# Patient Record
Sex: Female | Born: 1995 | Race: White | Hispanic: No | Marital: Single | Smoking: Current every day smoker
Health system: Southern US, Community
[De-identification: ages and names within clinical notes are randomized; demographics above are authoritative.]

## PROBLEM LIST (undated history)

## (undated) DIAGNOSIS — F329 Major depressive disorder, single episode, unspecified: Secondary | ICD-10-CM

## (undated) DIAGNOSIS — F419 Anxiety disorder, unspecified: Secondary | ICD-10-CM

## (undated) DIAGNOSIS — F32A Depression, unspecified: Secondary | ICD-10-CM

## (undated) DIAGNOSIS — K219 Gastro-esophageal reflux disease without esophagitis: Secondary | ICD-10-CM

## (undated) HISTORY — PX: WISDOM TOOTH EXTRACTION: SHX21

## (undated) HISTORY — DX: Depression, unspecified: F32.A

## (undated) HISTORY — DX: Anxiety disorder, unspecified: F41.9

## (undated) HISTORY — DX: Major depressive disorder, single episode, unspecified: F32.9

## (undated) HISTORY — DX: Gastro-esophageal reflux disease without esophagitis: K21.9

---
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
## 2013-05-20 ENCOUNTER — Telehealth: Payer: Self-pay | Admitting: Nurse Practitioner

## 2013-05-20 DIAGNOSIS — N200 Calculus of kidney: Secondary | ICD-10-CM

## 2013-05-21 NOTE — Telephone Encounter (Signed)
referral made.

## 2013-05-22 ENCOUNTER — Telehealth: Payer: Self-pay | Admitting: Nurse Practitioner

## 2013-05-22 NOTE — Telephone Encounter (Signed)
Need to know why, IS she pregnant?

## 2013-05-26 ENCOUNTER — Other Ambulatory Visit: Payer: Self-pay | Admitting: Nurse Practitioner

## 2013-05-26 DIAGNOSIS — Z3490 Encounter for supervision of normal pregnancy, unspecified, unspecified trimester: Secondary | ICD-10-CM

## 2013-05-26 NOTE — Telephone Encounter (Signed)
Yes she is pregnant

## 2013-05-26 NOTE — Telephone Encounter (Signed)
Referral made 

## 2013-05-26 NOTE — Telephone Encounter (Signed)
Patient is pregnant.

## 2013-05-26 NOTE — Telephone Encounter (Signed)
Aware ref has been started

## 2013-05-28 ENCOUNTER — Telehealth: Payer: Self-pay | Admitting: Nurse Practitioner

## 2014-01-15 ENCOUNTER — Telehealth: Payer: Self-pay | Admitting: Nurse Practitioner

## 2014-01-15 ENCOUNTER — Other Ambulatory Visit: Payer: Self-pay | Admitting: Nurse Practitioner

## 2014-01-15 DIAGNOSIS — F32A Depression, unspecified: Secondary | ICD-10-CM

## 2014-01-15 DIAGNOSIS — F329 Major depressive disorder, single episode, unspecified: Secondary | ICD-10-CM

## 2014-01-15 MED ORDER — SERTRALINE HCL 100 MG PO TABS: 100.0000 mg | ORAL_TABLET | Freq: Every day | ORAL | Status: DC

## 2014-01-15 NOTE — Telephone Encounter (Signed)
Looks like she should have ran out in 05/2013, not sure about this?

## 2014-01-15 NOTE — Telephone Encounter (Signed)
I sent the Rx to the pharmacy. Patient NTBS for follow up and lab work

## 2014-01-20 ENCOUNTER — Other Ambulatory Visit: Payer: Self-pay | Admitting: Nurse Practitioner

## 2014-02-05 IMAGING — CR DG ANKLE COMPLETE 3+V*L*
3 series · 3 of 3 positions shown · non-contrast
Comparison: None.

CLINICAL DATA: Pain/injury to left foot/ankle

LEFT ANKLE COMPLETE - 3+ VIEW

[view not recorded (1 of 3)]
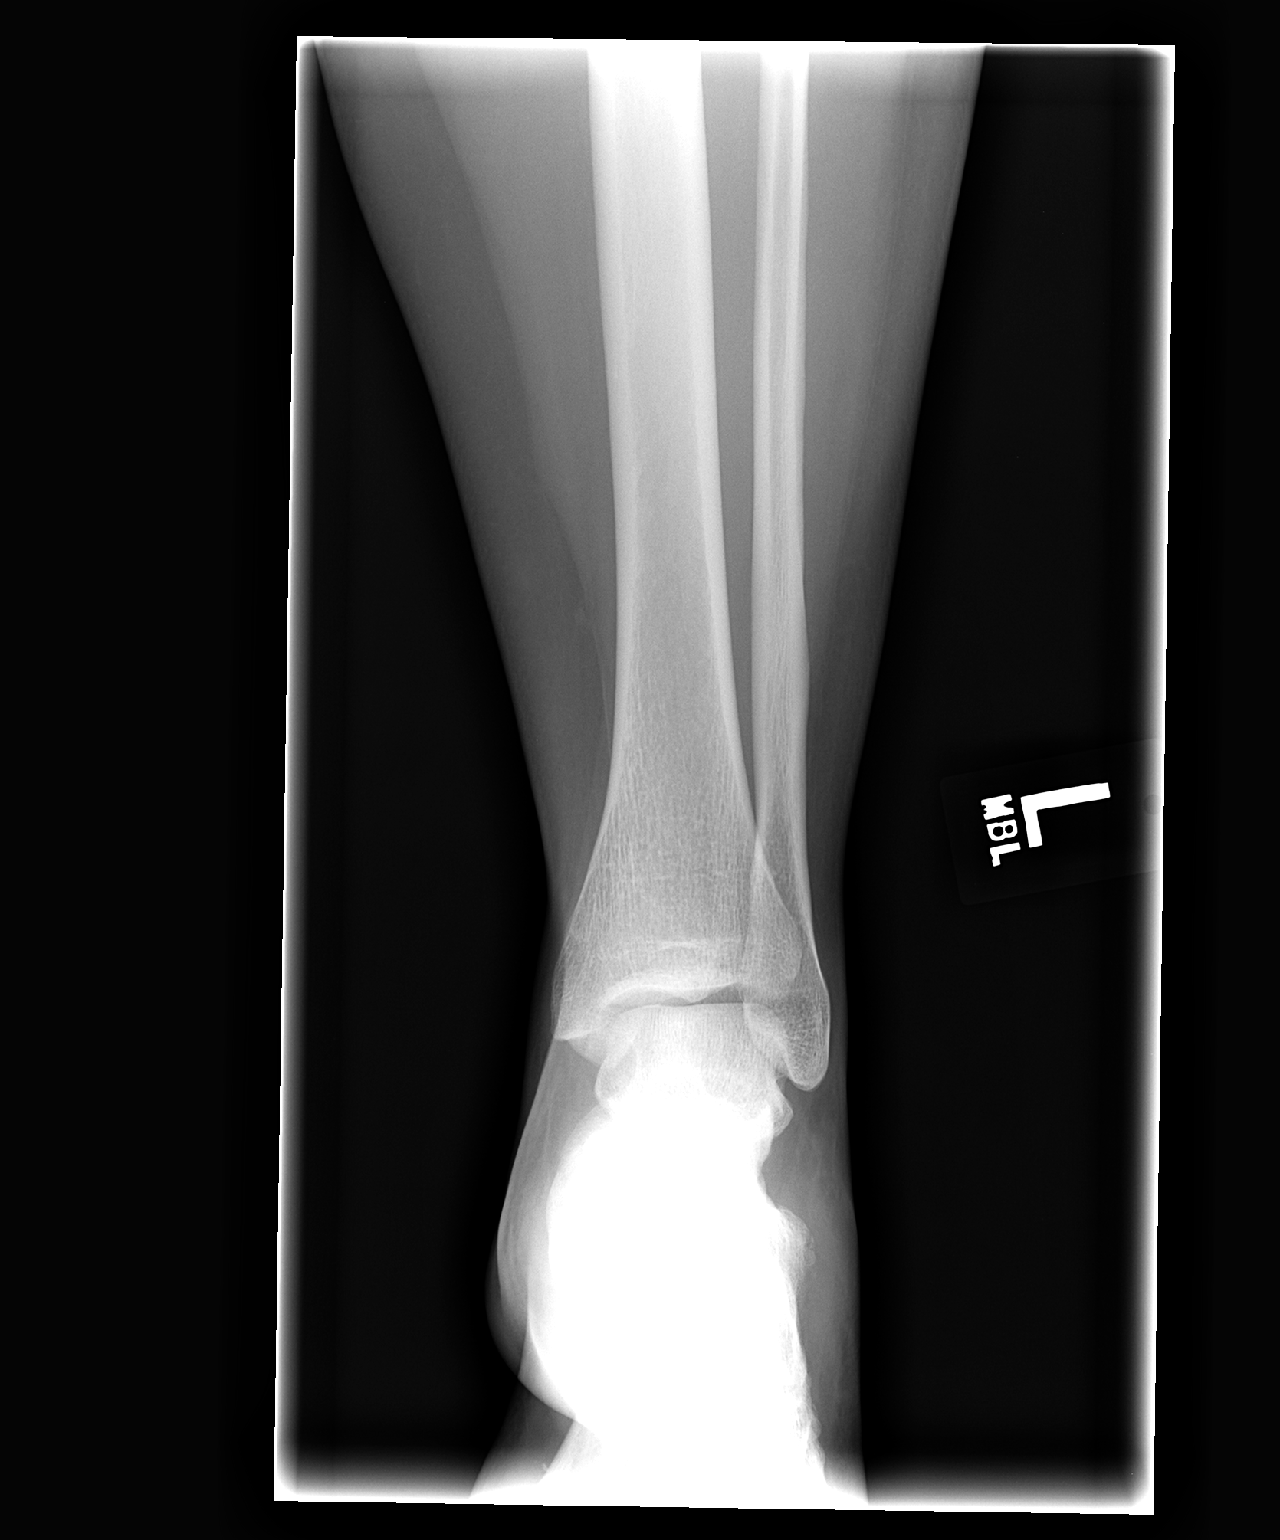

[view not recorded (2 of 3)]
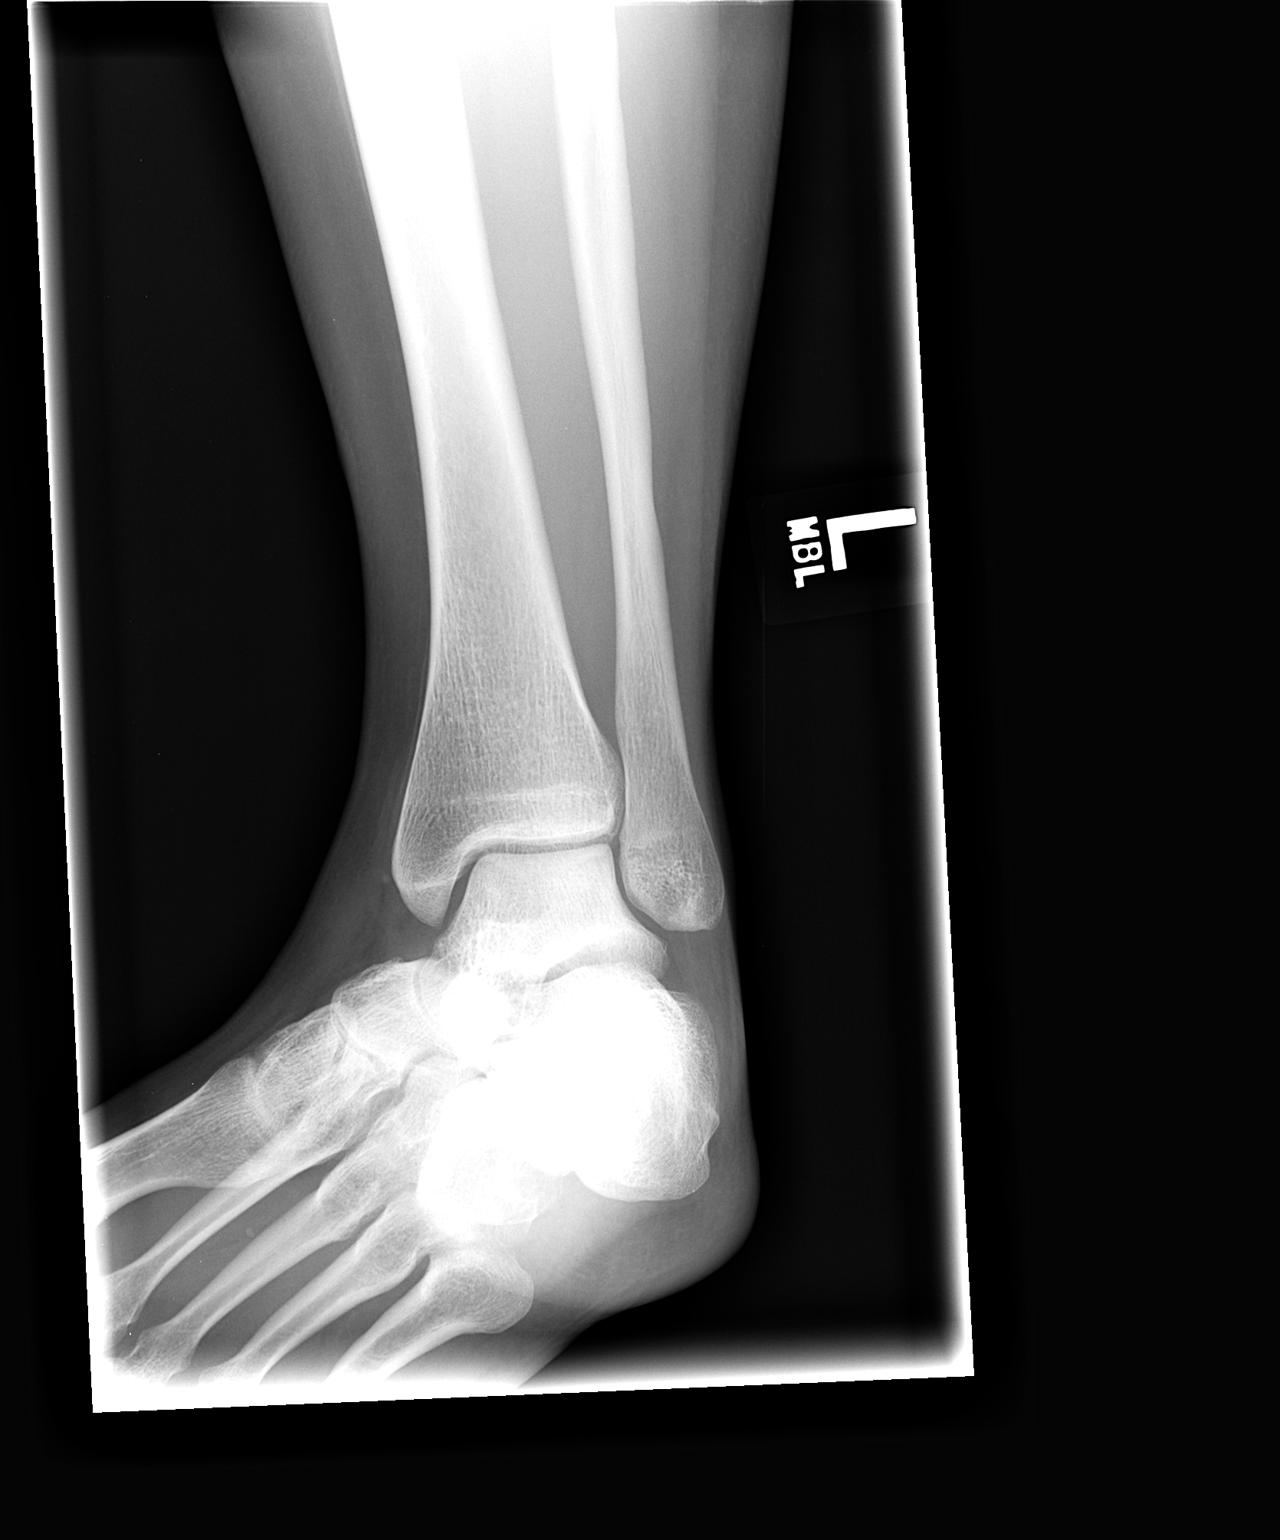

[view not recorded (3 of 3)]
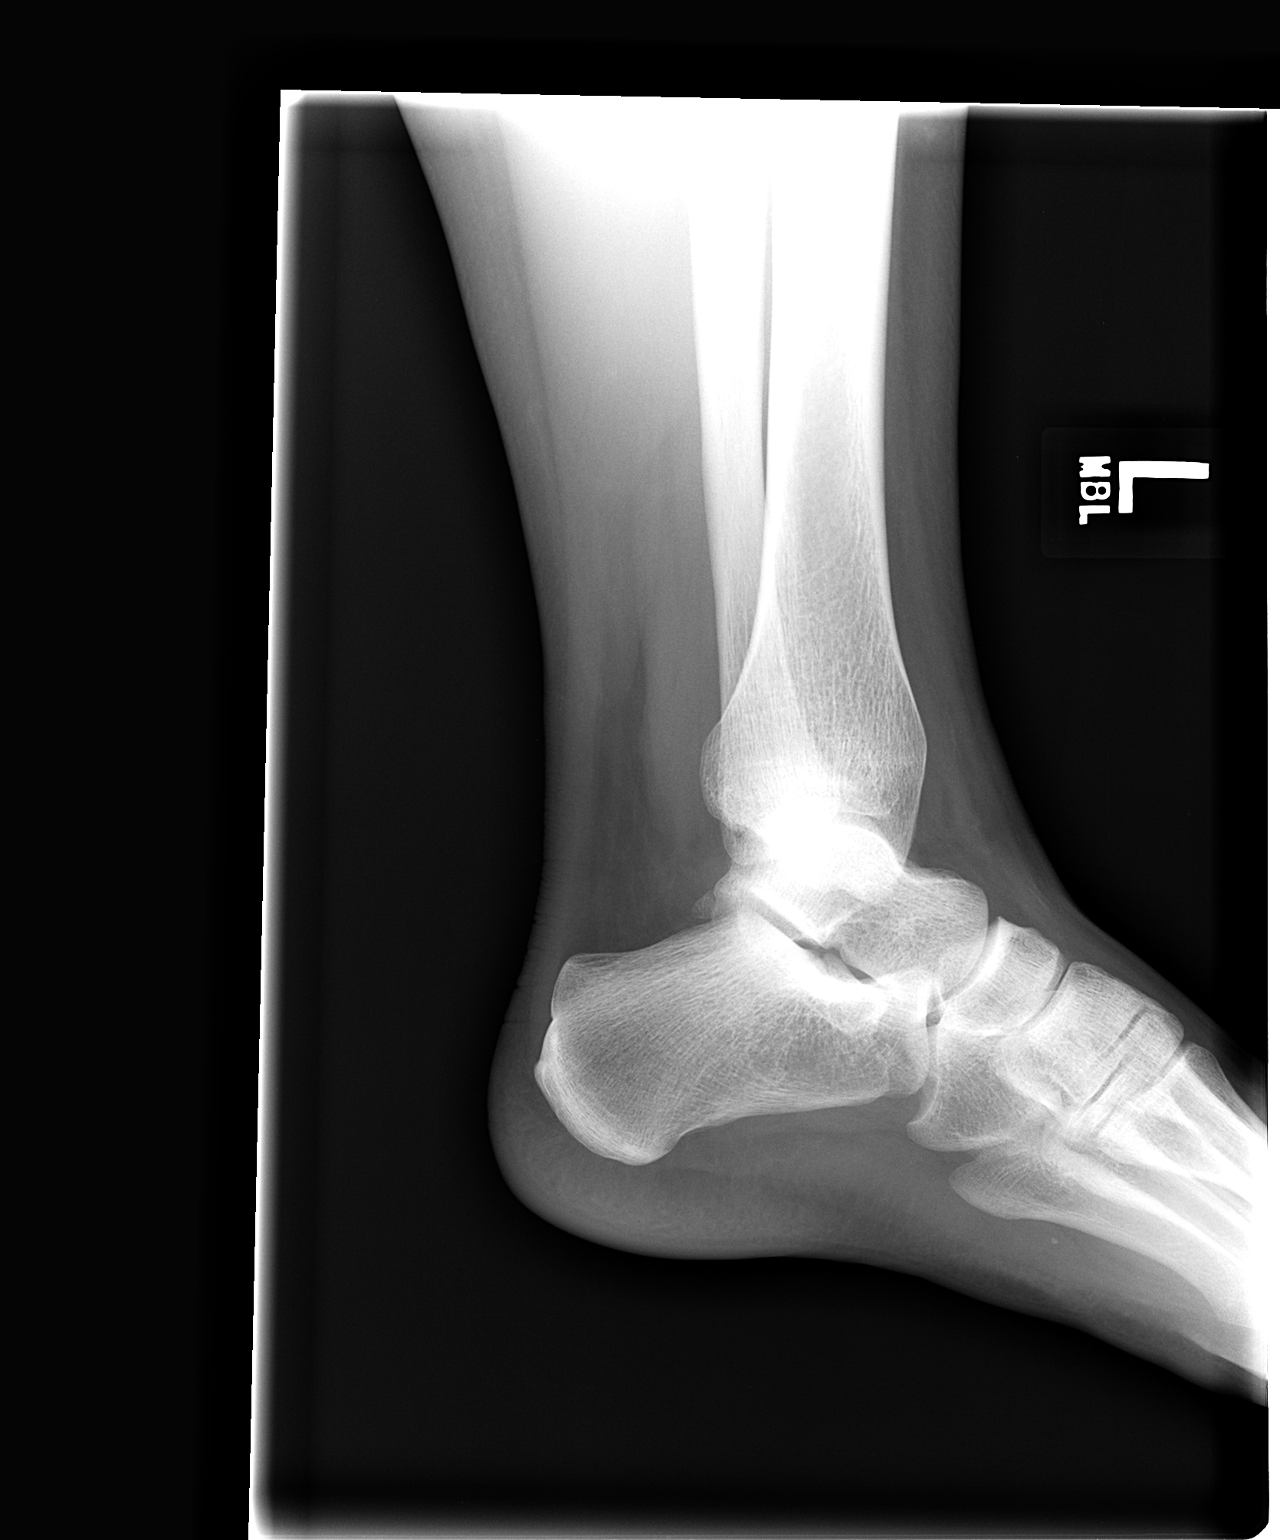

[3 of 3 positions shown; findings below may reference images not displayed]

FINDINGS: No fracture or dislocation is seen.

The ankle mortise is intact.

The base of the fifth metatarsal is unremarkable.

The visualized soft tissues are unremarkable.
IMPRESSION: No fracture or dislocation is seen.

## 2014-02-15 ENCOUNTER — Ambulatory Visit (INDEPENDENT_AMBULATORY_CARE_PROVIDER_SITE_OTHER): Payer: Medicaid Other | Admitting: Nurse Practitioner

## 2014-02-15 ENCOUNTER — Encounter: Payer: Self-pay | Admitting: Nurse Practitioner

## 2014-02-15 VITALS — BP 120/70 | HR 88 | Temp 98.4°F | Ht 62.0 in | Wt 175.0 lb

## 2014-02-15 DIAGNOSIS — F3289 Other specified depressive episodes: Secondary | ICD-10-CM

## 2014-02-15 DIAGNOSIS — F329 Major depressive disorder, single episode, unspecified: Secondary | ICD-10-CM

## 2014-02-15 DIAGNOSIS — F32A Depression, unspecified: Secondary | ICD-10-CM

## 2014-02-15 DIAGNOSIS — F411 Generalized anxiety disorder: Secondary | ICD-10-CM

## 2014-02-15 MED ORDER — ESCITALOPRAM OXALATE 20 MG PO TABS: 20.0000 mg | ORAL_TABLET | Freq: Every day | ORAL | Status: DC

## 2014-02-15 NOTE — Patient Instructions (Signed)

## 2014-02-15 NOTE — Progress Notes (Signed)
   Subjective:    Patient ID: Kayla FraiseElizabeth D Ding, female    DOB: Feb 23, 1996, 18 y.o.   MRN: [id number]  HPI Patient here today for follow up of depression and anxiety- She is currently on zoloft 100mg  daily- SHe says that she can't really tell if it is helping or not. Would like  To try something else. Took celexa in the past and that did ot really help.    Review of Systems  Constitutional: Negative for appetite change.  Respiratory: Negative.   Cardiovascular: Negative.   Gastrointestinal: Negative.   Genitourinary: Negative.   Neurological: Negative.   Psychiatric/Behavioral: Negative.   All other systems reviewed and are negative.      Objective:   Physical Exam  Constitutional: She is oriented to person, place, and time. She appears well-developed and well-nourished.  Cardiovascular: Normal rate and normal heart sounds.   Pulmonary/Chest: Effort normal and breath sounds normal.  Neurological: She is alert and oriented to person, place, and time.  Skin: Skin is warm and dry.  Psychiatric: She has a normal mood and affect. Her behavior is normal. Judgment and thought content normal.   BP 120/70  Pulse 88  Temp(Src) 98.4 F (36.9 C) (Oral)  Ht 5\' 2"  (1.575 m)  Wt 175 lb (79.379 kg)  BMI 32.00 kg/m2  LMP 02/14/2014        Assessment & Plan:   1. Depression   2. GAD (generalized anxiety disorder)    Meds ordered this encounter  Medications  . Norgestimate-Ethinyl Estradiol Triphasic (TRI-SPRINTEC) 0.18/0.215/0.25 MG-35 MCG tablet    Sig: Take 1 tablet by mouth daily.  Marland Kitchen. escitalopram (LEXAPRO) 20 MG tablet    Sig: Take 1 tablet (20 mg total) by mouth daily.    Dispense:  30 tablet    Refill:  3    Order Specific Question:  Supervising Provider    Answer:  Ernestina PennaMOORE, DONALD W [[id number]]   Stop zoloft- replace with lexapro Side effects reviewed Stress ,management RTO in 1-2 months follow up  Mary-Margaret Daphine DeutscherMartin, FNP

## 2014-04-19 ENCOUNTER — Ambulatory Visit: Payer: Medicaid Other | Admitting: Nurse Practitioner

## 2014-11-03 ENCOUNTER — Ambulatory Visit: Payer: Medicaid Other | Admitting: Nurse Practitioner

## 2014-11-18 ENCOUNTER — Ambulatory Visit: Payer: Medicaid Other | Admitting: Nurse Practitioner

## 2014-11-19 ENCOUNTER — Ambulatory Visit: Payer: Medicaid Other | Admitting: Nurse Practitioner

## 2015-02-11 ENCOUNTER — Telehealth: Payer: Self-pay | Admitting: Nurse Practitioner

## 2015-02-11 MED ORDER — ESCITALOPRAM OXALATE 20 MG PO TABS: 20.0000 mg | ORAL_TABLET | Freq: Every day | ORAL | Status: DC

## 2015-02-11 NOTE — Telephone Encounter (Signed)
lexapro refilled

## 2015-02-11 NOTE — Telephone Encounter (Signed)
Pt notified of Rx

## 2015-03-08 ENCOUNTER — Encounter (INDEPENDENT_AMBULATORY_CARE_PROVIDER_SITE_OTHER): Payer: Self-pay

## 2015-03-08 ENCOUNTER — Encounter: Payer: Self-pay | Admitting: Nurse Practitioner

## 2015-03-08 ENCOUNTER — Ambulatory Visit (INDEPENDENT_AMBULATORY_CARE_PROVIDER_SITE_OTHER): Payer: 59 | Admitting: Nurse Practitioner

## 2015-03-08 VITALS — BP 109/73 | HR 94 | Temp 97.2°F | Ht 62.0 in | Wt 166.0 lb

## 2015-03-08 DIAGNOSIS — F329 Major depressive disorder, single episode, unspecified: Secondary | ICD-10-CM

## 2015-03-08 DIAGNOSIS — F32A Depression, unspecified: Secondary | ICD-10-CM

## 2015-03-08 DIAGNOSIS — F411 Generalized anxiety disorder: Secondary | ICD-10-CM

## 2015-03-08 MED ORDER — ESCITALOPRAM OXALATE 20 MG PO TABS: 20.0000 mg | ORAL_TABLET | Freq: Every day | ORAL | Status: DC

## 2015-03-08 NOTE — Patient Instructions (Signed)

## 2015-03-08 NOTE — Progress Notes (Signed)
   Subjective:    Patient ID: Kayla Sanchez, female    DOB: 09-Mar-1996, 19 y.o.   MRN: [id number]  HPI Patient in today for recheck of:-depression/GAD- currently on lexapro 10 mg daily which is working well for her- keeps her from feeling down and depressed- has had a couple of panic attacks but otherwise is doing well without side effects.     Review of Systems  Constitutional: Negative.   HENT: Negative.   Respiratory: Negative.   Cardiovascular: Negative.   Gastrointestinal: Negative.   Genitourinary: Negative.   Neurological: Negative.   Psychiatric/Behavioral: Negative.   All other systems reviewed and are negative.      Objective:   Physical Exam  Constitutional: She is oriented to person, place, and time. She appears well-developed and well-nourished.  HENT:  Nose: Nose normal.  Mouth/Throat: Oropharynx is clear and moist.  Eyes: EOM are normal.  Neck: Trachea normal, normal range of motion and full passive range of motion without pain. Neck supple. No JVD present. Carotid bruit is not present. No thyromegaly present.  Cardiovascular: Normal rate, regular rhythm, normal heart sounds and intact distal pulses.  Exam reveals no gallop and no friction rub.   No murmur heard. Pulmonary/Chest: Effort normal and breath sounds normal.  Abdominal: Soft. Bowel sounds are normal. She exhibits no distension and no mass. There is no tenderness.  Musculoskeletal: Normal range of motion.  Lymphadenopathy:    She has no cervical adenopathy.  Neurological: She is alert and oriented to person, place, and time. She has normal reflexes.  Skin: Skin is warm and dry.  Psychiatric: She has a normal mood and affect. Her behavior is normal. Judgment and thought content normal.   BP 109/73 mmHg  Pulse 94  Temp(Src) 97.2 F (36.2 C) (Oral)  Ht  (1.575 m)  Wt 166 lb (75.297 kg)  BMI 30.35 kg/m2     Assessment & Plan:   1. GAD (generalized anxiety disorder)   2. Depression      Meds ordered this encounter  Medications  . escitalopram (LEXAPRO) 20 MG tablet    Sig: Take 1 tablet (20 mg total) by mouth daily.    Dispense:  30 tablet    Refill:  5    Order Specific Question:  Supervising Provider    Answer:  Ernestina Penna [[id number]]   Stress management Follow up in 6 months  Mary-Margaret Daphine Deutscher, FNP

## 2015-06-28 ENCOUNTER — Telehealth: Payer: Self-pay | Admitting: Nurse Practitioner

## 2015-10-04 ENCOUNTER — Ambulatory Visit (INDEPENDENT_AMBULATORY_CARE_PROVIDER_SITE_OTHER): Payer: Medicaid Other | Admitting: Family Medicine

## 2015-10-04 ENCOUNTER — Encounter: Payer: Self-pay | Admitting: Family Medicine

## 2015-10-04 ENCOUNTER — Other Ambulatory Visit: Payer: Self-pay

## 2015-10-04 VITALS — BP 124/88 | HR 121 | Temp 97.8°F | Ht 62.0 in | Wt 173.4 lb

## 2015-10-04 DIAGNOSIS — R112 Nausea with vomiting, unspecified: Secondary | ICD-10-CM

## 2015-10-04 DIAGNOSIS — R10A0 Flank pain, unspecified side: Secondary | ICD-10-CM

## 2015-10-04 DIAGNOSIS — R109 Unspecified abdominal pain: Secondary | ICD-10-CM

## 2015-10-04 LAB — URINALYSIS
Bilirubin, UA: NEGATIVE
Glucose, UA: NEGATIVE
NITRITE UA: NEGATIVE
Specific Gravity, UA: 1.015 (ref 1.005–1.030)
Urobilinogen, Ur: 1 mg/dL (ref 0.2–1.0)
pH, UA: 7.5 (ref 5.0–7.5)

## 2015-10-04 LAB — URINALYSIS, MICROSCOPIC ONLY: Renal Epithel, UA: NONE SEEN /hpf

## 2015-10-04 MED ORDER — PROMETHAZINE HCL 25 MG/ML IJ SOLN: 25.0000 mg | Freq: Once | INTRAMUSCULAR | Status: DC

## 2015-10-04 MED ORDER — PROMETHAZINE HCL 25 MG/ML IJ SOLN
25.0000 mg | Freq: Once | INTRAMUSCULAR | Status: AC
  Administered 2015-10-04: 25 mg via INTRAMUSCULAR

## 2015-10-04 MED ORDER — ONDANSETRON HCL 4 MG PO TABS: 4.0000 mg | ORAL_TABLET | Freq: Three times a day (TID) | ORAL | Status: DC | PRN

## 2015-10-04 NOTE — Progress Notes (Signed)
Primary Physician: Bennie PieriniMARTIN,MARY MARGARET, FNP  Chief Complaint: Nausea and vomiting that started last night. Symptoms began about 5 hours after eating at fast food. No one else in the family has similar symptoms. There is been no diarrhea. She does have some flank pain but no dysuria or frequency. She tells me there is a past history of     Past Medical History  Diagnosis Date  . Depression      Home Meds: Prior to Admission medications   Medication Sig Start Date End Date Taking? Authorizing Provider  escitalopram (LEXAPRO) 20 MG tablet Take 1 tablet (20 mg total) by mouth daily. 03/08/15  Yes Mary-Margaret Daphine DeutscherMartin, FNP  ibuprofen (ADVIL,MOTRIN) 200 MG tablet Take 200 mg by mouth every 6 (six) hours as needed.   Yes Historical Provider, MD    Allergies:  Allergies  Allergen Reactions  . Ciprofloxacin   . Penicillins     Social History   Social History  . Marital Status: Single    Spouse Name: N/A  . Number of Children: N/A  . Years of Education: N/A   Occupational History  . Not on file.   Social History Main Topics  . Smoking status: Current Every Day Smoker  . Smokeless tobacco: Not on file  . Alcohol Use: No  . Drug Use: No  . Sexual Activity: Not on file   Other Topics Concern  . Not on file   Social History Narrative     Review of Systems: Constitutional: negative for chills, fever, night sweats, weight changes, or fatigue  HEENT: negative for vision changes, hearing loss, congestion, rhinorrhea, ST, epistaxis, or sinus pressure Cardiovascular: negative for chest pain or palpitations Respiratory: negative for hemoptysis, wheezing, shortness of breath, or cough Abdominal:  nausea, vomiting,  Dermatological: negative for rash Neurologic: negative for headache, dizziness, or syncope All other systems reviewed and are otherwise negative with the exception to those above and in the HPI.   Physical Exam: There were no vitals taken for this visit., There is  no weight on file to calculate BMI. General: Well developed, well nourished, in no acute distress. Head: Normocephalic, atraumatic, eyes without discharge, sclera non-icteric, nares are without discharge. Bilateral auditory canals clear, TM's are without perforation, pearly grey and translucent with reflective cone of light bilaterally. Oral cavity moist, posterior pharynx without exudate, erythema, peritonsillar abscess, or post nasal drip. Mucous membranes are slightly dry  Neck: Supple. No thyromegaly. Full ROM. No lymphadenopathy. Lungs: Clear bilaterally to auscultation without wheezes, rales, or rhonchi. Breathing is unlabored. Heart: RRR with S1 S2. No murmurs, rubs, or gallops appreciated. Abdomen: Soft, non-tender, non-distended with normoactive bowel sounds. No hepatomegaly. No rebound/guarding. No obvious abdominal masses. There is bilateral flank pain to palpation .   Labs: Urinalysis:   ASSESSMENT AND PLAN:  1. Flank pain Urine to rule out polynephritis. - Urinalysis - Urine Microscopic - promethazine (PHENERGAN) injection 25 mg; Inject 1 mL (25 mg total) into the vein once. Clear liquids for 24 hours (like 7-Up, ginger ale, Sprite, Jello, frozen pops) Full liquids the second 24-hours (like potato soup, tomato soup, chicken noodle soup) Bland diet the third 24-hours (boiled and baked foods, no fried or greasy foods) Avoid milk, cheese, ice cream and dairy products for 72 hours. Avoid caffeine (cola drinks, coffee, tea, Mountain Dew, Mellow Yellow) Take in small amounts, but frequently. Tylenol and/or Advil as needed for aches pains and fever Prescription for Zofran sent to pharmacy  Frederica KusterStephen M Miller MD  10/04/2015 11:46 AM

## 2015-10-04 NOTE — Patient Instructions (Signed)
Thank you for allowing [place] to care for you today. We strive to provide exceptional quality and compassionate care. Please let us know how we are doing and how we can help serve you better by filling out the survey that you receive from [facility].     Epley Maneuver Self-Care WHAT IS THE EPLEY MANEUVER? The Epley maneuver is an exercise you can do to relieve symptoms of benign paroxysmal positional vertigo (BPPV). This condition is often just referred to as vertigo. BPPV is caused by the movement of tiny crystals (canaliths) inside your inner ear. The accumulation and movement of canaliths in your inner ear causes a sudden spinning sensation (vertigo) when you move your head to certain positions. Vertigo usually lasts about 30 seconds. BPPV usually occurs in just one ear. If you get vertigo when you lie on your left side, you probably have BPPV in your left ear. Your health care provider can tell you which ear is involved.  BPPV may be caused by a head injury. Many people older than 50 get BPPV for unknown reasons. If you have been diagnosed with BPPV, your health care provider may teach you how to do this maneuver. BPPV is not life threatening (benign) and usually goes away in time.  WHEN SHOULD I PERFORM THE EPLEY MANEUVER? You can do this maneuver at home whenever you have symptoms of vertigo. You may do the Epley maneuver up to 3 times a day until your symptoms of vertigo go away. HOW SHOULD I DO THE EPLEY MANEUVER? 1. Sit on the edge of a bed or table with your back straight. Your legs should be extended or hanging over the edge of the bed or table.  2. Turn your head halfway toward the affected ear.  3. Lie backward quickly with your head turned until you are lying flat on your back. You may want to position a pillow under your shoulders.  4. Hold this position for 30 seconds. You may experience an attack of vertigo. This is normal. Hold this position until the vertigo stops. 5. Then turn  your head to the opposite direction until your unaffected ear is facing the floor.  6. Hold this position for 30 seconds. You may experience an attack of vertigo. This is normal. Hold this position until the vertigo stops. 7. Now turn your whole body to the same side as your head. Hold for another 30 seconds.  8. You can then sit back up. ARE THERE RISKS TO THIS MANEUVER? In some cases, you may have other symptoms (such as changes in your vision, weakness, or numbness). If you have these symptoms, stop doing the maneuver and call your health care provider. Even if doing these maneuvers relieves your vertigo, you may still have dizziness. Dizziness is the sensation of light-headedness but without the sensation of movement. Even though the Epley maneuver may relieve your vertigo, it is possible that your symptoms will return within 5 years. WHAT SHOULD I DO AFTER THIS MANEUVER? After doing the Epley maneuver, you can return to your normal activities. Ask your doctor if there is anything you should do at home to prevent vertigo. This may include:  Sleeping with two or more pillows to keep your head elevated.  Not sleeping on the side of your affected ear.  Getting up slowly from bed.  Avoiding sudden movements during the day.  Avoiding extreme head movement, like looking up or bending over.  Wearing a cervical collar to prevent sudden head movements. WHAT  SHOULD I DO IF MY SYMPTOMS GET WORSE? Call your health care provider if your vertigo gets worse. Call your provider right way if you have other symptoms, including:   Nausea.  Vomiting.  Headache.  Weakness.  Numbness.  Vision changes.   This information is not intended to replace advice given to you by your health care provider. Make sure you discuss any questions you have with your health care provider.   Document Released: 07/21/2013 Document Reviewed: 07/21/2013 Elsevier Interactive Patient Education [employer]2016 Elsevier Inc.

## 2016-02-28 ENCOUNTER — Encounter: Payer: Self-pay | Admitting: Pediatrics

## 2016-02-28 ENCOUNTER — Ambulatory Visit (INDEPENDENT_AMBULATORY_CARE_PROVIDER_SITE_OTHER): Payer: Medicaid Other | Admitting: Pediatrics

## 2016-02-28 VITALS — BP 119/75 | HR 87 | Temp 98.8°F | Ht 62.0 in | Wt 172.4 lb

## 2016-02-28 DIAGNOSIS — F410 Panic disorder [episodic paroxysmal anxiety] without agoraphobia: Secondary | ICD-10-CM

## 2016-02-28 MED ORDER — FLUOXETINE HCL 20 MG PO TABS: 20.0000 mg | ORAL_TABLET | Freq: Every day | ORAL | 3 refills | Status: DC

## 2016-02-28 NOTE — Progress Notes (Signed)
Subjective:    Patient ID: Kayla Sanchez, female    DOB: Jun 14, 1996, 20 y.o.   MRN: [id number]  CC: Arm Pain (Left) and Anxiety   HPI: Kayla Sanchez is a 20 y.o. female presenting for Arm Pain (Left) and Anxiety  Has a pain in LUE that goes down her L arm, sometimes hurts L upper chest/shoulder When she has the pain she can press on it and it helps Comes on when feeling panicky Started a new job recently Having panic attacks daily Had to leave early one day Wasn't taking lexapro regularly Now taking daily starting about 4 weeks ago New job started 4 weeks ago Before having panic attacks a couple times a month Mood has been fine No thoughts of self harm Does feel stressed with job Has been on zoloft in the past, max dose 100mg , did not notice any improvement celexa in the past, doesn't think that it helped Has not ever been in therapy, but is interested in it.   Depression screen [facility] 2/9 02/28/2016 03/08/2015 02/15/2014  Decreased Interest 1 2 2   Down, Depressed, Hopeless 1 3 1   PHQ - 2 Score 2 5 3   Altered sleeping 0 2 0  Tired, decreased energy 1 3 1   Change in appetite 1 2 0  Feeling bad or failure about yourself  1 1 0  Trouble concentrating 3 0 0  Moving slowly or fidgety/restless 0 2 1  Suicidal thoughts 0 0 0  PHQ-9 Score 8 15 5   Difficult doing work/chores Somewhat difficult - -    GAD 7 : Generalized Anxiety Score 02/28/2016  Nervous, Anxious, on Edge 3  Control/stop worrying 3  Worry too much - different things 3  Trouble relaxing 3  Restless 1  Easily annoyed or irritable 3  Afraid - awful might happen 3  Total GAD 7 Score 19  Anxiety Difficulty Somewhat difficult       Relevant past medical, surgical, family and social history reviewed and updated. Interim medical history since our last visit reviewed. Allergies and medications reviewed and updated.  History  Smoking Status  . Current Every Day Smoker  . Packs/day: 0.50  . Types:  Cigarettes  Smokeless Tobacco  . Never Used    ROS: Per HPI      Objective:    BP 119/75 (BP Location: Right Arm, Patient Position: Sitting, Cuff Size: Normal)   Pulse 87   Temp 98.8 F (37.1 C) (Oral)   Ht 5\' 2"  (1.575 m)   Wt 172 lb 6.4 oz (78.2 kg)   BMI 31.53 kg/m   Wt Readings from Last 3 Encounters:  02/28/16 172 lb 6.4 oz (78.2 kg)  10/04/15 173 lb 6.4 oz (78.7 kg)  03/08/15 166 lb (75.3 kg)     Gen: NAD, alert, cooperative with exam, NCAT EYES: EOMI, no scleral injection or icterus ENT:  TMs gray b/l, clear effusion behind L TM, OP without erythema LYMPH: no cervical LAD CV: NRRR, normal S1/S2, no murmur, distal pulses 2+ b/l Resp: CTABL, no wheezes, normal WOB Neuro: Alert and oriented, strength equal b/l UE and LE, coordination grossly normal MSK: slightly tender to palp over upper arm generally, no point tenderness Psych: normal affect     Assessment & Plan:    Kayla Sanchez was seen today for arm pain and anxiety. Stop lexapro, has bene on max dose. Start fluoxetine, could try SNRI venlafaxine if no improvement on max dose fluoxetine. Not trying now as pt without  health insurance and is more expensive. Gave list for counselors in area, pt to ask for CBT for panic.  Diagnoses and all orders for this visit:  Panic -     FLUoxetine (PROZAC) 20 MG tablet; Take 1 tablet (20 mg total) by mouth daily.   Follow up plan: Return in about 3 months (around 05/30/2016).  Rex Kras, MD [facility] 02/28/2016, 10:32 AM

## 2016-02-28 NOTE — Patient Instructions (Signed)
Cognitive behavioral therapy for panic disorder

## 2016-03-07 ENCOUNTER — Ambulatory Visit (INDEPENDENT_AMBULATORY_CARE_PROVIDER_SITE_OTHER): Payer: Self-pay | Admitting: Family Medicine

## 2016-03-07 ENCOUNTER — Encounter: Payer: Self-pay | Admitting: Family Medicine

## 2016-03-07 VITALS — BP 122/78 | HR 80 | Temp 97.9°F | Ht 62.0 in | Wt 171.8 lb

## 2016-03-07 DIAGNOSIS — R51 Headache: Secondary | ICD-10-CM

## 2016-03-07 DIAGNOSIS — R519 Headache, unspecified: Secondary | ICD-10-CM

## 2016-03-07 NOTE — Progress Notes (Signed)
BP 122/78 (BP Location: Left Arm, Patient Position: Sitting, Cuff Size: Large)   Pulse 80   Temp 97.9 F (36.6 C) (Oral)   Ht 5\' 2"  (1.575 m)   Wt 171 lb 12.8 oz (77.9 kg)   LMP 03/07/2016 (Exact Date)   BMI 31.42 kg/m    Subjective:    Patient ID: Kayla FraiseElizabeth D Crisanto, female    DOB: 1996-01-05, 20 y.o.   MRN: [id number]  HPI: Kayla Fraiselizabeth D Athanas is a 20 y.o. female presenting on 03/07/2016 for Ear Pain (left ear, feels like she has fluid in the ear) and Pressure in head (describes not as a headache, but areas of pressure in head)   HPI Left ear congestion and headaches Patient has left ear congestion and pain and pressure in her head. It's a low-grade pressure and she says is not severe. She denies any blurred vision associated with it. She's been getting every morning for the past week. She denies any fevers or chills or nasal drainage or cough or postnasal drainage. The pressure that she is having her head is mostly in the frontal region bilaterally but occasionally goes towards the left side of her head towards that ear. She denies any sick contacts that she knows of. She has not had a history of headaches similar to this.  Relevant past medical, surgical, family and social history reviewed and updated as indicated. Interim medical history since our last visit reviewed. Allergies and medications reviewed and updated.  Review of Systems  Constitutional: Negative for chills and fever.  HENT: Positive for ear pain and sinus pressure. Negative for congestion, ear discharge, postnasal drip, rhinorrhea, sneezing and sore throat.   Eyes: Negative for pain, redness and visual disturbance.  Respiratory: Negative for cough, chest tightness and shortness of breath.   Cardiovascular: Negative for chest pain and leg swelling.  Genitourinary: Negative for difficulty urinating and dysuria.  Musculoskeletal: Negative for back pain and gait problem.  Skin: Negative for rash.  Neurological:  Positive for headaches. Negative for light-headedness.  Psychiatric/Behavioral: Negative for agitation and behavioral problems.  All other systems reviewed and are negative.   Per HPI unless specifically indicated above     Medication List       Accurate as of 03/07/16  8:56 AM. Always use your most recent med list.          escitalopram 20 MG tablet Commonly known as:  LEXAPRO Take 1 tablet (20 mg total) by mouth daily.   FLUoxetine 20 MG tablet Commonly known as:  PROZAC Take 1 tablet (20 mg total) by mouth daily.          Objective:    BP 122/78 (BP Location: Left Arm, Patient Position: Sitting, Cuff Size: Large)   Pulse 80   Temp 97.9 F (36.6 C) (Oral)   Ht 5\' 2"  (1.575 m)   Wt 171 lb 12.8 oz (77.9 kg)   LMP 03/07/2016 (Exact Date)   BMI 31.42 kg/m   Wt Readings from Last 3 Encounters:  03/07/16 171 lb 12.8 oz (77.9 kg)  02/28/16 172 lb 6.4 oz (78.2 kg)  10/04/15 173 lb 6.4 oz (78.7 kg)    Physical Exam  Constitutional: She is oriented to person, place, and time. She appears well-developed and well-nourished. No distress.  HENT:  Right Ear: Tympanic membrane, external ear and ear canal normal.  Left Ear: External ear and ear canal normal. Tympanic membrane is retracted. Tympanic membrane is not injected, not scarred, not perforated, not erythematous  and not bulging.  No middle ear effusion.  Nose: No mucosal edema or rhinorrhea. No epistaxis. Right sinus exhibits frontal sinus tenderness. Right sinus exhibits no maxillary sinus tenderness. Left sinus exhibits frontal sinus tenderness. Left sinus exhibits no maxillary sinus tenderness.  Mouth/Throat: Uvula is midline and mucous membranes are normal. Posterior oropharyngeal edema present. No oropharyngeal exudate, posterior oropharyngeal erythema or tonsillar abscesses.  Eyes: Conjunctivae and EOM are normal.  Cardiovascular: Normal rate, regular rhythm, normal heart sounds and intact distal pulses.   No murmur  heard. Pulmonary/Chest: Effort normal and breath sounds normal. No respiratory distress. She has no wheezes.  Musculoskeletal: Normal range of motion. She exhibits no edema or tenderness.  Neurological: She is alert and oriented to person, place, and time. Coordination normal.  Skin: Skin is warm and dry. No rash noted. She is not diaphoretic.  Psychiatric: She has a normal mood and affect. Her behavior is normal.  Vitals reviewed.     Assessment & Plan:   Problem List Items Addressed This Visit    None    Visit Diagnoses    Sinus headache    -  Primary   Recommended aspirin and Flonase and Mucinex and an antihistamine, if not improved in 4 days give [place] call back       Follow up plan: Return if symptoms worsen or fail to improve.  Counseling provided for all of the vaccine components No orders of the defined types were placed in this encounter.   Arville Care, MD [facility] 03/07/2016, 8:56 AM

## 2016-03-14 ENCOUNTER — Emergency Department (HOSPITAL_COMMUNITY)
Admission: EM | Admit: 2016-03-14 | Discharge: 2016-03-14 | Disposition: A | Discharge status: Home / Self Care | Payer: Medicaid Other | Attending: Emergency Medicine | Admitting: Emergency Medicine

## 2016-03-14 ENCOUNTER — Encounter (HOSPITAL_COMMUNITY): Payer: Self-pay | Admitting: Emergency Medicine

## 2016-03-14 DIAGNOSIS — F17210 Nicotine dependence, cigarettes, uncomplicated: Secondary | ICD-10-CM | POA: Insufficient documentation

## 2016-03-14 DIAGNOSIS — F410 Panic disorder [episodic paroxysmal anxiety] without agoraphobia: Secondary | ICD-10-CM

## 2016-03-14 DIAGNOSIS — F419 Anxiety disorder, unspecified: Secondary | ICD-10-CM

## 2016-03-14 DIAGNOSIS — Z79899 Other long term (current) drug therapy: Secondary | ICD-10-CM | POA: Insufficient documentation

## 2016-03-14 MED ORDER — ALPRAZOLAM 0.25 MG PO TABS
0.2500 mg | ORAL_TABLET | Freq: Once | ORAL | Status: AC
  Administered 2016-03-14: 0.25 mg via ORAL
  Filled 2016-03-14: qty 1

## 2016-03-14 MED ORDER — ALPRAZOLAM 0.25 MG PO TABS: 0.2500 mg | ORAL_TABLET | Freq: Two times a day (BID) | ORAL | 0 refills | Status: DC | PRN

## 2016-03-14 NOTE — ED Triage Notes (Signed)
Patient presents for intermittent anxiety, depression, left shoulder pain, tingling in arm, fullness in head, HA, blurred vision, nausea x1-2 weeks. Recently taken off of lexapro and placed on prozac (5 days ago). Denies SI/HI/AVH. A&o x4, ambulatory with steady gait.

## 2016-03-14 NOTE — ED Provider Notes (Signed)
[facility]-EMERGENCY DEPT Provider Note   CSN: [id number] Arrival date & time: 03/14/16  1841     History   Chief Complaint Chief Complaint  Patient presents with  . Anxiety    HPI Kayla Sanchez is a 20 y.o. female.  HPI Patient has had problems with anxiety for a long time. She has been on Lexapro. Lexapro was discontinued on Friday. She then started Prozac on Sunday. Is now been on Prozac for 3 days. Patient poor she's felt more anxious, she reports that she has gotten tingling feelings in both her hands and her feet and she feels like she can't focus. Today before going to work it got so bad that she was unable to go into work. All her mother and advised that she needed to be seen at the hospital. Patient denies suicidal thoughts. Past Medical History:  Diagnosis Date  . Depression     Patient Active Problem List   Diagnosis Date Noted  . Depression 12/08/2012  . GAD (generalized anxiety disorder) 12/08/2012    Past Surgical History:  Procedure Laterality Date  . WISDOM TOOTH EXTRACTION      OB History    No data available       Home Medications    Prior to Admission medications   Medication Sig Start Date End Date Taking? Authorizing Provider  FLUoxetine (PROZAC) 20 MG tablet Take 1 tablet (20 mg total) by mouth daily. 02/28/16  Yes Johna Sheriffarol L Vincent, MD  ALPRAZolam Prudy Feeler(XANAX) 0.25 MG tablet Take 1 tablet (0.25 mg total) by mouth 2 (two) times daily as needed for anxiety (Use one tablet as needed for severe anxiety.). 03/14/16   Arby BarretteMarcy Jaylee Freeze, MD  escitalopram (LEXAPRO) 20 MG tablet Take 1 tablet (20 mg total) by mouth daily. Patient not taking: Reported on 03/14/2016 03/08/15   Mary-Margaret Daphine DeutscherMartin, FNP    Family History No family history on file.  Social History Social History  Substance Use Topics  . Smoking status: Current Every Day Smoker    Packs/day: 0.50    Types: Cigarettes  . Smokeless tobacco: Never Used  . Alcohol use No     Allergies     Ciprofloxacin and Penicillins   Review of Systems Review of Systems 10 Systems reviewed and are negative for acute change except as noted in the HPI.   Physical Exam Updated Vital Signs BP 137/74 (BP Location: Left Arm)   Pulse 83   Temp 98.8 F (37.1 C) (Oral)   Resp 20   LMP 03/07/2016 (Exact Date)   SpO2 100%   Physical Exam  Constitutional: She appears well-developed and well-nourished. No distress.  HENT:  Head: Normocephalic and atraumatic.  Eyes: Conjunctivae are normal.  Neck: Neck supple.  Cardiovascular: Normal rate and regular rhythm.   No murmur heard. Pulmonary/Chest: Effort normal and breath sounds normal. No respiratory distress.  Abdominal: Soft. There is no tenderness.  Musculoskeletal: She exhibits no edema.  Neurological: She is alert.  Skin: Skin is warm and dry.  Psychiatric: She has a normal mood and affect.  Nursing note and vitals reviewed.    ED Treatments / Results  Labs (all labs ordered are listed, but only abnormal results are displayed) Labs Reviewed - No data to display  EKG  EKG Interpretation None       Radiology No results found.  Procedures Procedures (including critical care time)  Medications Ordered in ED Medications  ALPRAZolam Prudy Feeler(XANAX) tablet 0.25 mg (0.25 mg Oral Given 03/14/16 2259)  Initial Impression / Assessment and Plan / ED Course  I have reviewed the triage vital signs and the nursing notes.  Pertinent labs & imaging results that were available during my care of the patient were reviewed by me and considered in my medical decision making (see chart for details).  Clinical Course    Final Clinical Impressions(s) / ED Diagnoses   Final diagnoses:  Panic attack  Anxiety  Patient is clinically well in appearance. She is otherwise healthy. Signs are stable. At this time I do not feel that additional diagnostic evaluation is indicated. Patient has had long-standing problems with anxiety and  depression. These symptoms have been escalating recently. Patient is scheduled to be in cognitive behavioral therapy and follow-up with psychiatry within the next week and a half. Patient will be provided with Xanax to use for severe anxiety episodes until she can begin her therapy sessions.  New Prescriptions New Prescriptions   ALPRAZOLAM (XANAX) 0.25 MG TABLET    Take 1 tablet (0.25 mg total) by mouth 2 (two) times daily as needed for anxiety (Use one tablet as needed for severe anxiety.).     Arby BarretteMarcy Kareen Hitsman, MD 03/14/16 [phone]

## 2016-03-16 ENCOUNTER — Telehealth: Payer: Self-pay | Admitting: Pediatrics

## 2016-03-16 NOTE — Telephone Encounter (Signed)
Returned patient's phone call.  Patient states that she feels like she is having withdrawals from Lexapro.  Patient started taking prozac about 5 days ago and stopped taking lexapro immediately.  Patient states that she feels like she does not want to get up, has been having anxiety attacks and nausea.   Patient was also seen in the ED 08/16 and was given Xanax to take.  Patient has been taking Xanax and Prozac and now feels dizzy.

## 2016-03-16 NOTE — Telephone Encounter (Signed)
Spoke with pt. Take 1/2 tab lexapro, 10mg  for 8 days with the prozac Then just the prozac Call me if still having symptoms, we will increase prozac dose Feels in a daze when she takes the xanax Pt to avoid xanax unless she really needs it, can take half a tab. Goal to not need it at all.

## 2016-04-19 ENCOUNTER — Encounter: Payer: Self-pay | Admitting: Nurse Practitioner

## 2016-04-19 ENCOUNTER — Ambulatory Visit (INDEPENDENT_AMBULATORY_CARE_PROVIDER_SITE_OTHER): Payer: Self-pay | Admitting: Nurse Practitioner

## 2016-04-19 VITALS — BP 112/75 | HR 82 | Temp 97.2°F | Ht 62.0 in | Wt 170.0 lb

## 2016-04-19 DIAGNOSIS — F410 Panic disorder [episodic paroxysmal anxiety] without agoraphobia: Secondary | ICD-10-CM

## 2016-04-19 DIAGNOSIS — F411 Generalized anxiety disorder: Secondary | ICD-10-CM

## 2016-04-19 MED ORDER — FLUOXETINE HCL 40 MG PO CAPS: 40.0000 mg | ORAL_CAPSULE | Freq: Every day | ORAL | 3 refills | Status: DC

## 2016-04-19 NOTE — Progress Notes (Signed)
   Subjective:    Patient ID: Kayla Sanchez, female    DOB: 03-12-1996, 20 y.o.   MRN: [id number]  HPI  Patient comes in today c/o anxiety that is causing chest pain. She says that she has been having intermittent chest pain in left side of chest that radiates into her back. Has been happening everyday for the past week. Not really related to eating that she can tell. She does have lots anxiety and she gets more anxious when she has chest pain. She saw Dr. Oswaldo DoneVincent in August and she started heron prozac 20mg  daily and she has seen a little improvement.  GAD 7 : Generalized Anxiety Score 04/19/2016 02/28/2016  Nervous, Anxious, on Edge 3 3  Control/stop worrying 3 3  Worry too much - different things 3 3  Trouble relaxing 3 3  Restless 2 1  Easily annoyed or irritable 3 3  Afraid - awful might happen 3 3  Total GAD 7 Score 20 19  Anxiety Difficulty - Somewhat difficult       Review of Systems  Constitutional: Negative for appetite change, fatigue and fever.  HENT: Negative.   Respiratory: Positive for chest tightness. Negative for shortness of breath.   Cardiovascular: Positive for chest pain.  Gastrointestinal: Negative.   Genitourinary: Negative.   Neurological: Negative.   Psychiatric/Behavioral: The patient is nervous/anxious.   All other systems reviewed and are negative.      Objective:   Physical Exam  Constitutional: She is oriented to person, place, and time. She appears well-developed and well-nourished. No distress.  Cardiovascular: Normal rate, regular rhythm and normal heart sounds.   Pulmonary/Chest: Effort normal and breath sounds normal.  Neurological: She is alert and oriented to person, place, and time.  Skin: Skin is warm.  Psychiatric: She has a normal mood and affect. Her behavior is normal. Judgment and thought content normal.    BP 112/75   Pulse 82   Temp 97.2 F (36.2 C) (Oral)   Ht 5\' 2"  (1.575 m)   Wt 170 lb (77.1 kg)   BMI 31.09 kg/m         Assessment & Plan:   1. Panic   2. GAD (generalized anxiety disorder)    Meds ordered this encounter  Medications  . FLUoxetine (PROZAC) 40 MG capsule    Sig: Take 1 capsule (40 mg total) by mouth daily.    Dispense:  30 capsule    Refill:  3    Order Specific Question:   Supervising Provider    Answer:   Ala BentVINCENT, CAROL L [[id number]]   Stress management Keep diary of episodes Follow up with Dr. Oswaldo DoneVincent in 3 weeks Mary-Margaret Daphine DeutscherMartin, FNP

## 2016-04-19 NOTE — Patient Instructions (Signed)
Stress and Stress Management Stress is a normal reaction to life events. It is what you feel when life demands more than you are used to or more than you can handle. Some stress can be useful. For example, the stress reaction can help you catch the last bus of the day, study for a test, or meet a deadline at work. But stress that occurs too often or for too long can cause problems. It can affect your emotional health and interfere with relationships and normal daily activities. Too much stress can weaken your immune system and increase your risk for physical illness. If you already have a medical problem, stress can make it worse. CAUSES  All sorts of life events may cause stress. An event that causes stress for one person may not be stressful for another person. Major life events commonly cause stress. These may be positive or negative. Examples include losing your job, moving into a new home, getting married, having a baby, or losing a loved one. Less obvious life events may also cause stress, especially if they occur day after day or in combination. Examples include working long hours, driving in traffic, caring for children, being in debt, or being in a difficult relationship. SIGNS AND SYMPTOMS Stress may cause emotional symptoms including, the following:  Anxiety. This is feeling worried, afraid, on edge, overwhelmed, or out of control.  Anger. This is feeling irritated or impatient.  Depression. This is feeling sad, down, helpless, or guilty.  Difficulty focusing, remembering, or making decisions. Stress may cause physical symptoms, including the following:   Aches and pains. These may affect your head, neck, back, stomach, or other areas of your body.  Tight muscles or clenched jaw.  Low energy or trouble sleeping. Stress may cause unhealthy behaviors, including the following:   Eating to feel better (overeating) or skipping meals.  Sleeping too little, too much, or both.  Working  too much or putting off tasks (procrastination).  Smoking, drinking alcohol, or using drugs to feel better. DIAGNOSIS  Stress is diagnosed through an assessment by your health care provider. Your health care provider will ask questions about your symptoms and any stressful life events.Your health care provider will also ask about your medical history and may order blood tests or other tests. Certain medical conditions and medicine can cause physical symptoms similar to stress. Mental illness can cause emotional symptoms and unhealthy behaviors similar to stress. Your health care provider may refer you to a mental health professional for further evaluation.  TREATMENT  Stress management is the recommended treatment for stress.The goals of stress management are reducing stressful life events and coping with stress in healthy ways.  Techniques for reducing stressful life events include the following:  Stress identification. Self-monitor for stress and identify what causes stress for you. These skills may help you to avoid some stressful events.  Time management. Set your priorities, keep a calendar of events, and learn to say "no." These tools can help you avoid making too many commitments. Techniques for coping with stress include the following:  Rethinking the problem. Try to think realistically about stressful events rather than ignoring them or overreacting. Try to find the positives in a stressful situation rather than focusing on the negatives.  Exercise. Physical exercise can release both physical and emotional tension. The key is to find a form of exercise you enjoy and do it regularly.  Relaxation techniques. These relax the body and mind. Examples include yoga, meditation, tai chi, biofeedback, deep  breathing, progressive muscle relaxation, listening to music, being out in nature, journaling, and other hobbies. Again, the key is to find one or more that you enjoy and can do  regularly.  Healthy lifestyle. Eat a balanced diet, get plenty of sleep, and do not smoke. Avoid using alcohol or drugs to relax.  Strong support network. Spend time with family, friends, or other people you enjoy being around.Express your feelings and talk things over with someone you trust. Counseling or talktherapy with a mental health professional may be helpful if you are having difficulty managing stress on your own. Medicine is typically not recommended for the treatment of stress.Talk to your health care provider if you think you need medicine for symptoms of stress. HOME CARE INSTRUCTIONS  Keep all follow-up visits as directed by your health care provider.  Take all medicines as directed by your health care provider. SEEK MEDICAL CARE IF:  Your symptoms get worse or you start having new symptoms.  You feel overwhelmed by your problems and can no longer manage them on your own. SEEK IMMEDIATE MEDICAL CARE IF:  You feel like hurting yourself or someone else.   This information is not intended to replace advice given to you by your health care provider. Make sure you discuss any questions you have with your health care provider.   Document Released: 01/09/2001 Document Revised: 08/06/2014 Document Reviewed: 03/10/2013 Elsevier Interactive Patient Education 2016 Elsevier Inc.  

## 2016-04-30 ENCOUNTER — Encounter: Payer: Self-pay | Admitting: Nurse Practitioner

## 2016-04-30 ENCOUNTER — Ambulatory Visit (INDEPENDENT_AMBULATORY_CARE_PROVIDER_SITE_OTHER): Payer: BLUE CROSS/BLUE SHIELD | Admitting: Nurse Practitioner

## 2016-04-30 VITALS — BP 109/69 | HR 74 | Temp 97.6°F | Ht 62.0 in | Wt 172.0 lb

## 2016-04-30 DIAGNOSIS — F410 Panic disorder [episodic paroxysmal anxiety] without agoraphobia: Secondary | ICD-10-CM | POA: Diagnosis not present

## 2016-04-30 DIAGNOSIS — F411 Generalized anxiety disorder: Secondary | ICD-10-CM | POA: Diagnosis not present

## 2016-04-30 MED ORDER — BUSPIRONE HCL 15 MG PO TABS: 15.0000 mg | ORAL_TABLET | Freq: Three times a day (TID) | ORAL | 2 refills | Status: DC

## 2016-04-30 NOTE — Patient Instructions (Addendum)
Panic Attacks °Panic attacks are sudden, short feelings of great fear or discomfort. You may have them for no reason when you are relaxed, when you are uneasy (anxious), or when you are sleeping.  °HOME CARE °· Take all your medicines as told. °· Check with your doctor before starting new medicines. °· Keep all doctor visits. °GET HELP IF: °· You are not able to take your medicines as told. °· Your symptoms do not get better. °· Your symptoms get worse. °GET HELP RIGHT AWAY IF: °· Your attacks seem different than your normal attacks. °· You have thoughts about hurting yourself or others. °· You take panic attack medicine and you have a side effect. °MAKE SURE YOU: °· Understand these instructions. °· Will watch your condition. °· Will get help right away if you are not doing well or get worse. °  °This information is not intended to replace advice given to you by your health care provider. Make sure you discuss any questions you have with your health care provider. °  °Document Released: 08/18/2010 Document Revised: 05/06/2013 Document Reviewed: 02/27/2013 °Elsevier Interactive Patient Education ©2016 Elsevier Inc. ° °

## 2016-04-30 NOTE — Addendum Note (Signed)
Addended by: Bennie PieriniMARTIN, MARY-MARGARET on: 04/30/2016 05:12 PM   Modules accepted: Orders

## 2016-04-30 NOTE — Progress Notes (Signed)
   Subjective:    Patient ID: Kayla FraiseElizabeth D Goens, female    DOB: [birth year], 20 y.o.   MRN: [id number]  HPI  Patient comes in today to discuss anxiety. She has frequent panic attacks despite being on prozac and xanax. SHe has tried zoloft and celexa in the past which did not help. SHe was on lexapro and came in to be seen and was switched to prozac. SHe ended up in ER in august after changing meds and all they did was xanax and sent her home. Her panic attacks occur daily and last several minutes- She says that she worries so much about having one. When talking furthe rwith patient she did not take lexapro daily as she should.  Review of Systems  Constitutional: Negative.   HENT: Negative.   Respiratory: Negative.   Cardiovascular: Negative.   Genitourinary: Negative.   Neurological: Negative.   Psychiatric/Behavioral: Negative.   All other systems reviewed and are negative.      Objective:   Physical Exam  Constitutional: She is oriented to person, place, and time. She appears well-developed and well-nourished.  Cardiovascular: Normal rate, regular rhythm and normal heart sounds.   Pulmonary/Chest: Effort normal and breath sounds normal.  Neurological: She is alert and oriented to person, place, and time.  Skin: Skin is warm.  Psychiatric: She has a normal mood and affect. Her behavior is normal. Judgment and thought content normal.   BP 109/69   Pulse 74   Temp 97.6 F (36.4 C) (Oral)   Ht 5\' 2"  (1.575 m)   Wt 172 lb (78 kg)   BMI 31.46 kg/m         Assessment & Plan:   1. GAD (generalized anxiety disorder)   2. Panic attacks    continue current meds List of counselors given to patient RTO prn  Mary-Margaret Daphine DeutscherMartin, FNP

## 2016-05-21 ENCOUNTER — Ambulatory Visit (INDEPENDENT_AMBULATORY_CARE_PROVIDER_SITE_OTHER): Payer: BLUE CROSS/BLUE SHIELD | Admitting: Psychiatry

## 2016-05-21 ENCOUNTER — Encounter (HOSPITAL_COMMUNITY): Payer: Self-pay | Admitting: Psychiatry

## 2016-05-21 VITALS — BP 106/71 | HR 75 | Ht 62.0 in | Wt 167.6 lb

## 2016-05-21 DIAGNOSIS — Z818 Family history of other mental and behavioral disorders: Secondary | ICD-10-CM | POA: Diagnosis not present

## 2016-05-21 DIAGNOSIS — Z811 Family history of alcohol abuse and dependence: Secondary | ICD-10-CM | POA: Diagnosis not present

## 2016-05-21 DIAGNOSIS — F17210 Nicotine dependence, cigarettes, uncomplicated: Secondary | ICD-10-CM

## 2016-05-21 DIAGNOSIS — Z813 Family history of other psychoactive substance abuse and dependence: Secondary | ICD-10-CM | POA: Diagnosis not present

## 2016-05-21 DIAGNOSIS — F410 Panic disorder [episodic paroxysmal anxiety] without agoraphobia: Secondary | ICD-10-CM

## 2016-05-21 MED ORDER — ESCITALOPRAM OXALATE 20 MG PO TABS: 20.0000 mg | ORAL_TABLET | Freq: Every day | ORAL | 2 refills | Status: DC

## 2016-05-21 MED ORDER — ALPRAZOLAM 0.25 MG PO TABS: 0.2500 mg | ORAL_TABLET | Freq: Two times a day (BID) | ORAL | 2 refills | Status: DC | PRN

## 2016-05-21 NOTE — Progress Notes (Signed)
Psychiatric Initial Adult Assessment   Patient Identification: Kayla Sanchez MRN:  [id number] Date of Evaluation:  05/21/2016 Referral Source: Bennie Pierini, nurse practitioner Chief Complaint:   Chief Complaint    Anxiety; Depression; Establish Care     Visit Diagnosis:    ICD-9-CM ICD-10-CM   1. Panic disorder 300.01 F41.0     History of Present Illness:  This patient is a 20 year old single white female who lives with her boyfriend and 93-year-old son in [place]. She is a Aeronautical engineer at [employer].  The patient was referred by Bennie Pierini, nurse practitioner at [facility] for further treatment and assessment of anxiety panic attacks and depression.  The patient states that she first got treated for depression when she was approximately 15. She was anxious sad sleeping too much and didn't want to go to school. She can't remember any specific triggers for these symptoms. She was put on Celexa through primary care but this did not work so well and then was placed on Zoloft for approximately 2 years which seemed to help. She then went to Lexapro. She wasn't taking it consistently and it was changed to Prozac in August. 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  During the switch between medicines she developed panic attacks which have escalated. She has had them every day now for several weeks, sometimes several times a day. During these she feels shaky with chest pain back pain tachycardia vertigo tremor light sensitivity. She even went to the emergency room once but nothing specific was found and she was given a low dose of Xanax which has helped. She doesn't think the Prozac but she is taking now is working for her. She thinks perhaps a Lexapro worked better. She denies being significantly depressed. Her sleep is okay but she often wakes up.  The patient does state she has  several stressors in her life. She is very hard on herself in terms of her work International aid/development worker. 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 She is responsible for 30 other employees and feels overwhelmed by this at times. She and her boyfriend have had struggles and she's not sure she wants to stay in the relationship and has even tried dating other people which made him angry. They have a child together so she is trying to work this out. She denies suicidal ideation but her energy is a bit low and she doesn't get any exercise. Sometimes she wishes she could do something else with her life but has no time given her ride to work as well as the long hours. She's never had any previous psychiatric treatment or counseling  Associated Signs/Symptoms: Depression Symptoms:  depressed mood, psychomotor retardation, fatigue, feelings of worthlessness/guilt, anxiety, panic attacks, (Hypo) Manic Symptoms:   Anxiety Symptoms:  Panic Symptoms, Psychotic Symptoms:   PTSD Symptoms:   Past Psychiatric History: none  Previous Psychotropic Medications: Yes   Substance Abuse History in the last 12 months:  No.  Consequences of Substance Abuse: NA  Past Medical History:  Past Medical History:  Diagnosis Date  . Anxiety   . Depression     Past Surgical History:  Procedure Laterality Date  . WISDOM TOOTH EXTRACTION      Family Psychiatric History: The patient's mother and aunt have anxiety and panic disorder and depression. Maternal grandfather has  a history of alcohol abuse and anxiety. The patient's biological father has a history of substance abuse from [place] with pain medications  Family History:  Family History  Problem Relation Age of Onset  . Depression Mother   . Anxiety disorder Mother   . Drug abuse Father   . Anxiety disorder Maternal Aunt   . Alcohol abuse Maternal Grandfather   . Anxiety disorder Maternal Grandfather      Social History:   Social History   Social History  . Marital status: Single    Spouse name: N/A  . Number of children: N/A  . Years of education: N/A   Social History Main Topics  . Smoking status: Current Every Day Smoker    Packs/day: 0.50    Types: Cigarettes  . Smokeless tobacco: Never Used  . Alcohol use Yes     Comment: 05-21-2016 occa.  . Drug use: No     Comment: 05-21-2016 per pt 2 yrs ago  . Sexual activity: Yes    Birth control/ protection: Condom   Other Topics Concern  . None   Social History Narrative  . None    Additional Social History: The patient grew up in [place]. Her parents divorced when she was 20 years old. Her mother remarried and she grew up in a blended family. She has 4 brothers and one sister. She finished high school but never went to college and went to work for [employer] directly after high school. She's been living with her current boyfriend for 4 years and she has a 22-year-old son. She denies any history of trauma or abuse throughout her life  Allergies:   Allergies  Allergen Reactions  . Ciprofloxacin Other (See Comments)    Tendonitis   . Penicillins Hives    Has patient had a PCN reaction causing immediate rash, facial/tongue/throat swelling, SOB or lightheadedness with hypotension: No Has patient had a PCN reaction causing severe rash involving mucus membranes or skin necrosis: No Has patient had a PCN reaction that required hospitalization No Has patient had a PCN reaction occurring within the last 10 years: No If all of the above answers are "NO", then may proceed with Cephalosporin use.     Metabolic Disorder Labs: No results found for: HGBA1C, MPG No results found for: PROLACTIN No results found for: CHOL, TRIG, HDL, CHOLHDL, VLDL, LDLCALC   Current Medications: Current Outpatient Prescriptions  Medication Sig Dispense Refill  . ALPRAZolam (XANAX) 0.25 MG tablet Take 1 tablet (0.25 mg total) by mouth 2 (two) times daily  as needed for anxiety (Use one tablet as needed for severe anxiety.). 60 tablet 2  . escitalopram (LEXAPRO) 20 MG tablet Take 1 tablet (20 mg total) by mouth daily. 30 tablet 2   No current facility-administered medications for this visit.     Neurologic: Headache: Yes Seizure: No Paresthesias:No  Musculoskeletal: Strength & Muscle Tone: within normal limits Gait & Station: normal Patient leans: N/A  Psychiatric Specialty Exam: Review of Systems  Cardiovascular: Positive for chest pain and palpitations.  Neurological: Positive for tingling.  Psychiatric/Behavioral: Positive for depression. The patient is nervous/anxious.   All other systems reviewed and are negative.   Blood pressure 106/71, pulse 75, height 5\' 2"  (1.575 m), weight 167 lb 9.6 oz (76 kg).Body mass index is 30.65 kg/m.  General Appearance: Casual, Neat and Well Groomed  Eye Contact:  Good  Speech:  Clear and Coherent  Volume:  Normal  Mood:  Anxious  Affect:  Congruent  Thought Process:  Goal Directed  Orientation:  Full (Time, Place, and Person)  Thought Content:  Rumination  Suicidal Thoughts:  No  Homicidal Thoughts:  No  Memory:  Immediate;   Good Recent;   Good Remote;   Good  Judgement:  Good  Insight:  Fair  Psychomotor Activity:  Normal  Concentration:  Concentration: Fair and Attention Span: Fair  Recall:  Good  Fund of Knowledge:Good  Language: Good  Akathisia:  No  Handed:  Right  AIMS (if indicated):    Assets:  Communication Skills Desire for Improvement Physical Health Resilience Social Support Talents/Skills  ADL's:  Intact  Cognition: WNL  Sleep:  ok    Treatment Plan Summary: Medication management   Patient is a 20 year old female with a history of moderate depression followed by anxiety and panic attacks. These seem to be precipitated by change in medication and also by the new job responsibilities. She does state the Xanax helps and I told her to take the Xanax 0.25 mg  on a scheduled basis for at least a week to bring down the frequency of the panic disorder. She'll also discontinue Prozac and restart Lexapro 20 mg daily as she thinks is probably work better for her. We will also schedule her for counseling here to help her with management of the panic attack. She will return to see me in 4 weeks   [name], MD 10/23/20178:56 AM

## 2016-05-31 ENCOUNTER — Ambulatory Visit: Payer: Medicaid Other | Admitting: Pediatrics

## 2016-06-01 ENCOUNTER — Encounter: Payer: Self-pay | Admitting: Pediatrics

## 2016-06-01 ENCOUNTER — Telehealth: Payer: Self-pay | Admitting: Pediatrics

## 2016-06-05 ENCOUNTER — Encounter (HOSPITAL_COMMUNITY): Payer: Self-pay | Admitting: Psychiatry

## 2016-06-05 ENCOUNTER — Ambulatory Visit (INDEPENDENT_AMBULATORY_CARE_PROVIDER_SITE_OTHER): Payer: BLUE CROSS/BLUE SHIELD | Admitting: Psychiatry

## 2016-06-05 DIAGNOSIS — F410 Panic disorder [episodic paroxysmal anxiety] without agoraphobia: Secondary | ICD-10-CM | POA: Diagnosis not present
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
## 2016-06-15 ENCOUNTER — Ambulatory Visit (INDEPENDENT_AMBULATORY_CARE_PROVIDER_SITE_OTHER): Payer: BLUE CROSS/BLUE SHIELD | Admitting: Psychiatry

## 2016-06-15 ENCOUNTER — Encounter (HOSPITAL_COMMUNITY): Payer: Self-pay | Admitting: Psychiatry

## 2016-06-15 VITALS — BP 120/82 | Ht 62.0 in | Wt 169.0 lb

## 2016-06-15 DIAGNOSIS — Z818 Family history of other mental and behavioral disorders: Secondary | ICD-10-CM | POA: Diagnosis not present

## 2016-06-15 DIAGNOSIS — Z880 Allergy status to penicillin: Secondary | ICD-10-CM

## 2016-06-15 DIAGNOSIS — F410 Panic disorder [episodic paroxysmal anxiety] without agoraphobia: Secondary | ICD-10-CM | POA: Diagnosis not present

## 2016-06-15 DIAGNOSIS — Z813 Family history of other psychoactive substance abuse and dependence: Secondary | ICD-10-CM

## 2016-06-15 DIAGNOSIS — Z811 Family history of alcohol abuse and dependence: Secondary | ICD-10-CM

## 2016-06-15 DIAGNOSIS — Z888 Allergy status to other drugs, medicaments and biological substances status: Secondary | ICD-10-CM

## 2016-06-15 DIAGNOSIS — F17210 Nicotine dependence, cigarettes, uncomplicated: Secondary | ICD-10-CM

## 2016-06-15 DIAGNOSIS — Z79899 Other long term (current) drug therapy: Secondary | ICD-10-CM

## 2016-06-15 MED ORDER — ESCITALOPRAM OXALATE 20 MG PO TABS: 20.0000 mg | ORAL_TABLET | Freq: Every day | ORAL | 2 refills | Status: DC

## 2016-06-15 NOTE — Progress Notes (Signed)
Psychiatric Initial Adult Assessment   Patient Identification: Kayla Sanchez MRN:  [id number] Date of Evaluation:  06/15/2016 Referral Source: Bennie Pierini, nurse practitioner Chief Complaint:   Chief Complaint    Depression; Anxiety; Follow-up     Visit Diagnosis:    ICD-9-CM ICD-10-CM   1. Panic disorder 300.01 F41.0     History of Present Illness:  This patient is a 20 year old single white female who lives with her boyfriend and 22-year-old son in [place]. She is a Aeronautical engineer at [employer].  The patient was referred by Bennie Pierini, nurse practitioner at [facility] family medicine for further treatment and assessment of anxiety panic attacks and depression.  The patient states that she first got treated for depression when she was approximately 15. She was anxious sad sleeping too much and didn't want to go to school. She can't remember any specific triggers for these symptoms. She was put on Celexa through primary care but this did not work so well and then was placed on Zoloft for approximately 2 years which seemed to help. She then went to Lexapro. She wasn't taking it consistently and it was changed to Prozac in August. 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  During the switch between medicines she developed panic attacks which have escalated. She has had them every day now for several weeks, sometimes several times a day. During these she feels shaky with chest pain back pain tachycardia vertigo tremor light sensitivity. She even went to the emergency room once but nothing specific was found and she was given a low dose of Xanax which has helped. She doesn't think the Prozac but she is taking now is working for her. She thinks perhaps a Lexapro worked better. She denies being significantly depressed. Her sleep is okay but she often wakes up.  The patient does state she has  several stressors in her life. She is very hard on herself in terms of her work International aid/development worker. 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 She is responsible for 30 other employees and feels overwhelmed by this at times. She and her boyfriend have had struggles and she's not sure she wants to stay in the relationship and has even tried dating other people which made him angry. They have a child together so she is trying to work this out. She denies suicidal ideation but her energy is a bit low and she doesn't get any exercise. Sometimes she wishes she could do something else with her life but has no time given her ride to work as well as the long hours. She's never had any previous psychiatric treatment or counseling  The patient returns after 4 weeks. She is now on Lexapro 20 mg daily. She seems to be tolerating it well although she is a little bit drowsy during the day. She takes it before bedtime. She usually uses 1 or 2 Xanax or 0.25 mg per day and it is helping her anxiety. She states that she is taking a new position to work evenings and it should be less stressful. She still not sure what she wants to do about her boyfriend but wants to get her work life figured out first. She has started the counseling here with Florencia Reasons and finds it helpful. She is less anxious but still has a little bit of panic  every day around 6 PM before she has to go home and face her boyfriend. She is sleeping well now  Associated Signs/Symptoms: Depression Symptoms:  depressed mood, psychomotor retardation, fatigue, feelings of worthlessness/guilt, anxiety, panic attacks, (Hypo) Manic Symptoms:   Anxiety Symptoms:  Panic Symptoms, Psychotic Symptoms:   PTSD Symptoms:   Past Psychiatric History: none  Previous Psychotropic Medications: Yes   Substance Abuse History in the last 12 months:  No.  Consequences of Substance Abuse: NA  Past Medical History:   Past Medical History:  Diagnosis Date  . Anxiety   . Depression     Past Surgical History:  Procedure Laterality Date  . WISDOM TOOTH EXTRACTION      Family Psychiatric History: The patient's mother and aunt have anxiety and panic disorder and depression. Maternal grandfather has a history of alcohol abuse and anxiety. The patient's biological father has a history of substance abuse from [place] with pain medications  Family History:  Family History  Problem Relation Age of Onset  . Depression Mother   . Anxiety disorder Mother   . Drug abuse Father   . Anxiety disorder Maternal Aunt   . Alcohol abuse Maternal Grandfather   . Anxiety disorder Maternal Grandfather     Social History:   Social History   Social History  . Marital status: Single    Spouse name: N/A  . Number of children: N/A  . Years of education: N/A   Social History Main Topics  . Smoking status: Current Every Day Smoker    Packs/day: 0.50    Years: 4.00    Types: Cigarettes  . Smokeless tobacco: Never Used  . Alcohol use Yes     Comment: 3 beers 1 x per week  . Drug use: No     Comment: 05-21-2016 per pt 2 yrs ago  . Sexual activity: Yes    Birth control/ protection: Condom   Other Topics Concern  . None   Social History Narrative  . None    Additional Social History: The patient grew up in [place]. Her parents divorced when she was 20 years old. Her mother remarried and she grew up in a blended family. She has 4 brothers and one sister. She finished high school but never went to college and went to work for [employer] directly after high school. She's been living with her current boyfriend for 4 years and she has a 22-year-old son. She denies any history of trauma or abuse throughout her life  Allergies:   Allergies  Allergen Reactions  . Ciprofloxacin Other (See Comments)    Tendonitis   . Penicillins Hives    Has patient had a PCN reaction causing immediate rash, facial/tongue/throat swelling,  SOB or lightheadedness with hypotension: No Has patient had a PCN reaction causing severe rash involving mucus membranes or skin necrosis: No Has patient had a PCN reaction that required hospitalization No Has patient had a PCN reaction occurring within the last 10 years: No If all of the above answers are "NO", then may proceed with Cephalosporin use.     Metabolic Disorder Labs: No results found for: HGBA1C, MPG No results found for: PROLACTIN No results found for: CHOL, TRIG, HDL, CHOLHDL, VLDL, LDLCALC   Current Medications: Current Outpatient Prescriptions  Medication Sig Dispense Refill  . ALPRAZolam (XANAX) 0.25 MG tablet Take 1 tablet (0.25 mg total) by mouth 2 (two) times daily as needed for anxiety (Use one tablet as needed for severe anxiety.). 60 tablet 2  .  escitalopram (LEXAPRO) 20 MG tablet Take 1 tablet (20 mg total) by mouth daily. 30 tablet 2   No current facility-administered medications for this visit.     Neurologic: Headache: Yes Seizure: No Paresthesias:No  Musculoskeletal: Strength & Muscle Tone: within normal limits Gait & Station: normal Patient leans: N/A  Psychiatric Specialty Exam: Review of Systems  Cardiovascular: Positive for chest pain and palpitations.  Neurological: Positive for tingling.  Psychiatric/Behavioral: Positive for depression. The patient is nervous/anxious.   All other systems reviewed and are negative.   Blood pressure 120/82, height 5\' 2"  (1.575 m), weight 169 lb (76.7 kg).Body mass index is 30.91 kg/m.  General Appearance: Casual, Neat and Well Groomed  Eye Contact:  Good  Speech:  Clear and Coherent  Volume:  Normal  Mood:Fairly good   Affect:Bright   Thought Process:  Goal Directed  Orientation:  Full (Time, Place, and Person)  Thought Content:  Rumination  Suicidal Thoughts:  No  Homicidal Thoughts:  No  Memory:  Immediate;   Good Recent;   Good Remote;   Good  Judgement:  Good  Insight:  Fair  Psychomotor  Activity:  Normal  Concentration:  Concentration: Fair and Attention Span: Fair  Recall:  Good  Fund of Knowledge:Good  Language: Good  Akathisia:  No  Handed:  Right  AIMS (if indicated):    Assets:  Communication Skills Desire for Improvement Physical Health Resilience Social Support Talents/Skills  ADL's:  Intact  Cognition: WNL  Sleep:  ok    Treatment Plan Summary: Medication management  The patient will continue Lexapro 20 mg daily as well as Xanax 0.25 mg twice a day as needed. She'll continue her counseling and return to see me in 2 months   Benedict Kue, Gavin PoundEBORAH, MD 11/17/20179:10 AM

## 2016-06-22 NOTE — Telephone Encounter (Signed)
Multiple attempts made to contact patient to follow up on missed appt with Dr. Oswaldo DoneVincent. Left message for patient to call back to reschedule.   This encounter will now be closed

## 2016-06-26 DIAGNOSIS — Z3689 Encounter for other specified antenatal screening: Secondary | ICD-10-CM | POA: Diagnosis not present

## 2016-06-29 ENCOUNTER — Ambulatory Visit (HOSPITAL_COMMUNITY): Payer: Self-pay | Admitting: Psychiatry

## 2016-07-03 DIAGNOSIS — N911 Secondary amenorrhea: Secondary | ICD-10-CM | POA: Diagnosis not present

## 2016-07-10 ENCOUNTER — Ambulatory Visit (HOSPITAL_COMMUNITY): Payer: Self-pay | Admitting: Psychiatry

## 2016-07-13 DIAGNOSIS — N911 Secondary amenorrhea: Secondary | ICD-10-CM | POA: Diagnosis not present

## 2016-07-30 ENCOUNTER — Encounter (HOSPITAL_COMMUNITY): Payer: Self-pay | Admitting: Emergency Medicine

## 2016-07-30 ENCOUNTER — Emergency Department (HOSPITAL_COMMUNITY)
Admission: EM | Admit: 2016-07-30 | Discharge: 2016-07-31 | Disposition: A | Discharge status: Home / Self Care | Payer: Medicaid Other | Attending: Emergency Medicine | Admitting: Emergency Medicine

## 2016-07-30 DIAGNOSIS — Z87891 Personal history of nicotine dependence: Secondary | ICD-10-CM | POA: Insufficient documentation

## 2016-07-30 DIAGNOSIS — O2311 Infections of bladder in pregnancy, first trimester: Secondary | ICD-10-CM | POA: Insufficient documentation

## 2016-07-30 DIAGNOSIS — O0281 Inappropriate change in quantitative human chorionic gonadotropin (hCG) in early pregnancy: Secondary | ICD-10-CM | POA: Insufficient documentation

## 2016-07-30 DIAGNOSIS — Z3490 Encounter for supervision of normal pregnancy, unspecified, unspecified trimester: Secondary | ICD-10-CM

## 2016-07-30 DIAGNOSIS — Z3A09 9 weeks gestation of pregnancy: Secondary | ICD-10-CM | POA: Diagnosis not present

## 2016-07-30 DIAGNOSIS — R42 Dizziness and giddiness: Secondary | ICD-10-CM | POA: Insufficient documentation

## 2016-07-30 DIAGNOSIS — Z79899 Other long term (current) drug therapy: Secondary | ICD-10-CM | POA: Diagnosis not present

## 2016-07-30 DIAGNOSIS — N3001 Acute cystitis with hematuria: Secondary | ICD-10-CM

## 2016-07-30 LAB — URINALYSIS, ROUTINE W REFLEX MICROSCOPIC
Bilirubin Urine: NEGATIVE
Glucose, UA: NEGATIVE mg/dL
Ketones, ur: 5 mg/dL — AB
Nitrite: NEGATIVE
PROTEIN: 30 mg/dL — AB
SPECIFIC GRAVITY, URINE: 1.023 (ref 1.005–1.030)
pH: 5 (ref 5.0–8.0)

## 2016-07-30 LAB — CBC
HEMATOCRIT: 38.4 % (ref 36.0–46.0)
HEMOGLOBIN: 13.2 g/dL (ref 12.0–15.0)
MCH: 30.6 pg (ref 26.0–34.0)
MCHC: 34.4 g/dL (ref 30.0–36.0)
MCV: 89.1 fL (ref 78.0–100.0)
Platelets: 216 10*3/uL (ref 150–400)
RBC: 4.31 MIL/uL (ref 3.87–5.11)
RDW: 12.8 % (ref 11.5–15.5)
WBC: 10.6 10*3/uL — ABNORMAL HIGH (ref 4.0–10.5)

## 2016-07-30 LAB — BASIC METABOLIC PANEL
ANION GAP: 10 (ref 5–15)
BUN: 8 mg/dL (ref 6–20)
CHLORIDE: 104 mmol/L (ref 101–111)
CO2: 22 mmol/L (ref 22–32)
Calcium: 9.2 mg/dL (ref 8.9–10.3)
Creatinine, Ser: 0.51 mg/dL (ref 0.44–1.00)
GFR calc Af Amer: >60 mL/min (ref >60)
GFR calc non Af Amer: >60 mL/min (ref >60)
GLUCOSE: 99 mg/dL (ref 65–99)
Potassium: 3.5 mmol/L (ref 3.5–5.1)
Sodium: 136 mmol/L (ref 135–145)

## 2016-07-30 LAB — CBG MONITORING, ED: GLUCOSE-CAPILLARY: 107 mg/dL — AB (ref 65–99)

## 2016-07-30 MED ORDER — SODIUM CHLORIDE 0.9 % IV BOLUS (SEPSIS)
1000.0000 mL | Freq: Once | INTRAVENOUS | Status: AC
  Administered 2016-07-31: 1000 mL via INTRAVENOUS

## 2016-07-30 NOTE — ED Provider Notes (Signed)
By signing my name below, I, Kayla Sanchez, attest that this documentation has been prepared under the direction and in the presence of Kayla N Ward, DO. Electronically signed, Kayla Sanchez, ED Scribe. 07/30/16. 11:44 PM.  TIME SEEN: 11:26 PM  CHIEF COMPLAINT: Intermittent dizziness  HPI: HPI Comments: Kayla Sanchez is a 21 y.o. female, with Hx of anxiety, who is G2P1A0, who presents to the Emergency Department complaining of intermittent dizziness that she describes as lightheadedness with associated sensation in her head described as "shaky", onset approximately two weeks ago. Pt is confirmed [redacted] weeks pregnant and has had episodes of dizziness and lightheadedness and states, "It feels like I'm going to pass out." Denies any changes with position. She does not feel this way currently but is concerned because she is pregnant. This is the pt's 2nd pregnancy, no complications with the her first. Pt did not have any similar symptoms during previous pregnancy. She denies any actual headache but reports an intermittent sensation in the front and back of her head of wooziness. She also reports intermittent change in change in vision but was not specific in describing this. No vision loss.  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 She is able to ambulate without difficulty. LNMP on 05/23/2016. No chest pain or shortness of breath. No abdominal pain. No numbness or tingling, focal weakness in extremities. No fever, chills, vomiting, diarrhea. She denies any vaginal bleeding or discharge. Denies any dysuria or hematuria. Denies any flank pain. Does have a history of kidney stones.   Dr. Marcelle Overlie is her OBGYN.  States she has had an ultrasound confirming that this is an intrauterine pregnancy.   ROS: See HPI Constitutional: no fever  Eyes: no drainage  ENT: no  runny nose   Cardiovascular:  no chest pain  Resp: no SOB  GI: no vomiting GU: no dysuria Integumentary: no rash  Allergy: no hives  Musculoskeletal: no leg swelling  Neurological: no slurred speech ROS otherwise negative  PAST MEDICAL HISTORY/PAST SURGICAL HISTORY:  Past Medical History:  Diagnosis Date  . Anxiety   . Depression     MEDICATIONS:  Prior to Admission medications   Medication Sig Start Date End Date Taking? Authorizing Provider  ALPRAZolam (XANAX) 0.25 MG tablet Take 1 tablet (0.25 mg total) by mouth 2 (two) times daily as needed for anxiety (Use one tablet as needed for severe anxiety.). 05/21/16   Myrlene Broker, MD  escitalopram (LEXAPRO) 20 MG tablet Take 1 tablet (20 mg total) by mouth daily. 06/15/16   Myrlene Broker, MD    ALLERGIES:  Allergies  Allergen Reactions  . Ciprofloxacin Other (See Comments)    Tendonitis   . Penicillins Hives    Has patient had a PCN reaction causing immediate rash, facial/tongue/throat swelling, SOB or lightheadedness with hypotension: No Has patient had a PCN reaction causing severe rash involving mucus membranes or skin necrosis: No Has patient had a PCN reaction that required hospitalization No Has patient had a PCN reaction occurring within the last 10 years: No If all of the above answers are "NO", then may proceed with Cephalosporin use.     SOCIAL HISTORY:  Social History  Substance Use Topics  . Smoking status: Former Smoker    Packs/day: 0.50    Years: 4.00    Types: Cigarettes    Quit date: 06/29/2016  .  Smokeless tobacco: Never Used  . Alcohol use Yes     Comment: 3 beers 1 x per week    FAMILY HISTORY: Family History  Problem Relation Age of Onset  . Depression Mother   . Anxiety disorder Mother   . Drug abuse Father   . Anxiety disorder Maternal Aunt   . Alcohol abuse Maternal Grandfather   . Anxiety disorder Maternal Grandfather     EXAM: BP (!) 122/51 (BP Location: Left Arm)   Pulse 87    Temp 98.2 F (36.8 C) (Oral)   Resp 16   Ht 5\' 2"  (1.575 m)   Wt 165 lb (74.8 kg)   LMP 05/23/2016   SpO2 100%   BMI 30.18 kg/m  CONSTITUTIONAL: Alert and oriented and responds appropriately to questions. Well-appearing; well-nourished, Afebrile, nontoxic, appears well-hydrated HEAD: Normocephalic EYES: Conjunctivae clear, PERRL, EOMI ENT: normal nose; no rhinorrhea; moist mucous membranes NECK: Supple, no meningismus, no nuchal rigidity, no LAD  CARD: RRR; S1 and S2 appreciated; no murmurs, no clicks, no rubs, no gallops RESP: Normal chest excursion without splinting or tachypnea; breath sounds clear and equal bilaterally; no wheezes, no rhonchi, no rales, no hypoxia or respiratory distress, speaking full sentences ABD/GI: Normal bowel sounds; non-distended; soft, non-tender, no rebound, no guarding, no peritoneal signs, no hepatosplenomegaly BACK:  The back appears normal and is non-tender to palpation, there is no CVA tenderness EXT: Normal ROM in all joints; non-tender to palpation; no edema; normal capillary refill; no cyanosis, no calf tenderness or swelling    SKIN: Normal color for age and race; warm; no rash NEURO: Moves all extremities equally, sensation to light touch intact diffusely, cranial nerves II through XII intact, normal speech. Strength 5/5 throughout all extremities. No dysmetria finger to nose test bilaterally. PSYCH: The patient's mood and manner are appropriate. Grooming and personal hygiene are appropriate.  MEDICAL DECISION MAKING: Patient here with complaints of lightheadedness. No actual headache or neurologic deficits on exam. Discussed with her that this could be because of normal physiologic change in pregnancy were her blood pressure is lower than normal. Family reports that they noted her diastolic blood pressure in the 50s which they think is abnormal for her. She denies any infectious symptoms, chest pain or short of breath, neurologic deficits, current  headache. Very well-appearing on exam. Labs ordered in triage are unremarkable. No significant anemia, electrolyte abnormality, glucose is normal. Will check TSH and urinalysis. EKG shows no ischemic changes, arrhythmia or interval change. Doubt ACS, pulmonary embolus, dissection, intracranial hemorrhage, cavernous sinus thrombosis. She has had an ultrasound showing that this is an intrauterine pregnancy. Doubt heterotopic pregnancy.  ED PROGRESS: Patient's TSH is normal. First urine shows a dirty collection. We have had her repeat this urine which shows moderate hemoglobin and rare bacteria but no other sign of infection. There are calcium oxylate crystals present.  She denies abdominal pain or flank pain. Does have a history of kidney stones. Will treat for possible UTI however given she is pregnant. Urine culture is pending on the second urine. She reports feeling somewhat better after IV hydration. She is mildly orthostatic which again I think is physiologically secondary to being pregnant. I recommended she increase her water intake at home. I recommended she follow-up with her PCP in one week to have her urine rechecked. Recommend follow-up with her OB/GYN as needed. Discussed return precautions with patient and family. They verbalize understanding and are comfortable with this plan.   At this time, I do  not feel there is any life-threatening condition present. I have reviewed and discussed all results (EKG, imaging, lab, urine as appropriate) and exam findings with patient/family. I have reviewed nursing notes and appropriate previous records.  I feel the patient is safe to be discharged home without further emergent workup and can continue workup as an outpatient as needed. Discussed usual and customary return precautions. Patient/family verbalize understanding and are comfortable with this plan.  Outpatient follow-up has been provided. All questions have been answered.     EKG  Interpretation  Date/Time:  Monday July 30 2016 21:21:53 EST Ventricular Rate:  86 PR Interval:  132 QRS Duration: 84 QT Interval:  366 QTC Calculation: 437 R Axis:   66 Text Interpretation:  Normal sinus rhythm Normal ECG No old tracing to compare Confirmed by WARD,  DO, Kayla ([id number]) on 07/30/2016 11:36:00 PM        I personally performed the services described in this documentation, which was scribed in my presence. The recorded information has been reviewed and is accurate.       Layla Maw Ward, DO 07/31/16 0200

## 2016-07-30 NOTE — ED Triage Notes (Signed)
Pt arrives via POv from home with dizziness, blurred vision, nausea and headaches for the last week. Pt reports 9weeks preg, OB is Dr. Marcelle OverlieHolland at [facility]. Pt denies fevers, recent vomiting, diarrhea. VSS.

## 2016-07-31 LAB — HCG, QUANTITATIVE, PREGNANCY: HCG, BETA CHAIN, QUANT, S: 102097 m[IU]/mL — AB (ref <5)

## 2016-07-31 LAB — URINALYSIS, ROUTINE W REFLEX MICROSCOPIC
Bilirubin Urine: NEGATIVE
Glucose, UA: NEGATIVE mg/dL
KETONES UR: NEGATIVE mg/dL
Leukocytes, UA: NEGATIVE
Nitrite: NEGATIVE
PROTEIN: NEGATIVE mg/dL
Specific Gravity, Urine: 1.016 (ref 1.005–1.030)
pH: 7 (ref 5.0–8.0)

## 2016-07-31 LAB — TSH: TSH: 0.635 u[IU]/mL (ref 0.350–4.500)

## 2016-07-31 MED ORDER — CEPHALEXIN 500 MG PO CAPS: 500.0000 mg | ORAL_CAPSULE | Freq: Two times a day (BID) | ORAL | 0 refills | Status: DC

## 2016-07-31 MED ORDER — CEPHALEXIN 250 MG PO CAPS
500.0000 mg | ORAL_CAPSULE | Freq: Once | ORAL | Status: AC
  Administered 2016-07-31: 500 mg via ORAL
  Filled 2016-07-31: qty 2

## 2016-07-31 MED ORDER — METOCLOPRAMIDE HCL 10 MG PO TABS: 10.0000 mg | ORAL_TABLET | Freq: Three times a day (TID) | ORAL | 0 refills | Status: DC | PRN

## 2016-07-31 NOTE — ED Notes (Signed)
Pt stable, ambulatory, states understanding of discharge instructions 

## 2016-08-01 ENCOUNTER — Telehealth: Payer: Self-pay | Admitting: *Deleted

## 2016-08-01 LAB — URINE CULTURE: Culture: <10000 — AB

## 2016-08-01 NOTE — Telephone Encounter (Signed)
Please call to schedule a follow up appointment due to your hospital visit.

## 2016-08-13 DIAGNOSIS — Z3481 Encounter for supervision of other normal pregnancy, first trimester: Secondary | ICD-10-CM | POA: Diagnosis not present

## 2016-08-13 DIAGNOSIS — Z3685 Encounter for antenatal screening for Streptococcus B: Secondary | ICD-10-CM | POA: Diagnosis not present

## 2016-08-15 ENCOUNTER — Ambulatory Visit (HOSPITAL_COMMUNITY): Payer: Self-pay | Admitting: Psychiatry

## 2016-08-20 DIAGNOSIS — Z3400 Encounter for supervision of normal first pregnancy, unspecified trimester: Secondary | ICD-10-CM | POA: Diagnosis not present

## 2016-08-20 DIAGNOSIS — Z113 Encounter for screening for infections with a predominantly sexual mode of transmission: Secondary | ICD-10-CM | POA: Diagnosis not present

## 2016-08-20 DIAGNOSIS — Z3481 Encounter for supervision of other normal pregnancy, first trimester: Secondary | ICD-10-CM | POA: Diagnosis not present

## 2016-08-20 DIAGNOSIS — Z3A12 12 weeks gestation of pregnancy: Secondary | ICD-10-CM | POA: Diagnosis not present

## 2016-08-20 DIAGNOSIS — N760 Acute vaginitis: Secondary | ICD-10-CM | POA: Diagnosis not present

## 2016-08-20 DIAGNOSIS — Z3480 Encounter for supervision of other normal pregnancy, unspecified trimester: Secondary | ICD-10-CM | POA: Diagnosis not present

## 2016-08-22 DIAGNOSIS — Z3491 Encounter for supervision of normal pregnancy, unspecified, first trimester: Secondary | ICD-10-CM | POA: Diagnosis not present

## 2016-08-22 DIAGNOSIS — Z36 Encounter for antenatal screening for chromosomal anomalies: Secondary | ICD-10-CM | POA: Diagnosis not present

## 2016-08-22 DIAGNOSIS — Z3682 Encounter for antenatal screening for nuchal translucency: Secondary | ICD-10-CM | POA: Diagnosis not present

## 2016-08-27 ENCOUNTER — Telehealth (HOSPITAL_COMMUNITY): Payer: Self-pay | Admitting: *Deleted

## 2016-08-27 ENCOUNTER — Encounter (HOSPITAL_COMMUNITY): Payer: Self-pay | Admitting: Psychiatry

## 2016-08-27 ENCOUNTER — Ambulatory Visit (INDEPENDENT_AMBULATORY_CARE_PROVIDER_SITE_OTHER): Payer: BLUE CROSS/BLUE SHIELD | Admitting: Psychiatry

## 2016-08-27 VITALS — BP 108/68 | HR 86 | Ht 62.0 in | Wt 176.6 lb

## 2016-08-27 DIAGNOSIS — Z813 Family history of other psychoactive substance abuse and dependence: Secondary | ICD-10-CM

## 2016-08-27 DIAGNOSIS — F410 Panic disorder [episodic paroxysmal anxiety] without agoraphobia: Secondary | ICD-10-CM | POA: Diagnosis not present

## 2016-08-27 DIAGNOSIS — Z811 Family history of alcohol abuse and dependence: Secondary | ICD-10-CM

## 2016-08-27 DIAGNOSIS — Z87891 Personal history of nicotine dependence: Secondary | ICD-10-CM

## 2016-08-27 DIAGNOSIS — Z818 Family history of other mental and behavioral disorders: Secondary | ICD-10-CM | POA: Diagnosis not present

## 2016-08-27 DIAGNOSIS — Z79899 Other long term (current) drug therapy: Secondary | ICD-10-CM

## 2016-08-27 DIAGNOSIS — Z880 Allergy status to penicillin: Secondary | ICD-10-CM

## 2016-08-27 DIAGNOSIS — Z888 Allergy status to other drugs, medicaments and biological substances status: Secondary | ICD-10-CM

## 2016-08-27 MED ORDER — ESCITALOPRAM OXALATE 10 MG PO TABS: 10.0000 mg | ORAL_TABLET | Freq: Every day | ORAL | 2 refills | Status: DC

## 2016-08-27 MED ORDER — ALPRAZOLAM 0.25 MG PO TABS: 0.2500 mg | ORAL_TABLET | Freq: Two times a day (BID) | ORAL | 2 refills | Status: DC | PRN

## 2016-08-27 MED ORDER — ESCITALOPRAM OXALATE 20 MG PO TABS: 20.0000 mg | ORAL_TABLET | Freq: Every day | ORAL | 2 refills | Status: DC

## 2016-08-27 NOTE — Progress Notes (Signed)
Psychiatric Initial Adult Assessment   Patient Identification: Kayla Sanchez MRN:  [id number] Date of Evaluation:  08/27/2016 Referral Source: [name], nurse practitioner Chief Complaint:   Chief Complaint    Depression; Anxiety; Follow-up     Visit Diagnosis:    ICD-9-CM ICD-10-CM   1. Panic disorder 300.01 F41.0     History of Present Illness:  This patient is a 21 year old single white female who lives with her boyfriend and 23-year-old son in [place]. 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  The patient was referred by [name], nurse practitioner at [facility] for further treatment and assessment of anxiety panic attacks and depression.  The patient states that she first got treated for depression when she was approximately 15. She was anxious sad sleeping too much and didn't want to go to school. She can't remember any specific triggers for these symptoms. She was put on Celexa through primary care but this did not work so well and then was placed on Zoloft for approximately 2 years which seemed to help. She then went to Lexapro. She wasn't taking it consistently and it was changed to Prozac in August. 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  During the switch between medicines she developed panic attacks which have escalated. She has had them every day now for several weeks, sometimes several times a day. During these she feels shaky with chest pain back pain tachycardia vertigo tremor light sensitivity. She even went to the emergency room once but nothing specific was found and she was given a low dose of Xanax which has helped. She doesn't think the Prozac but she is taking now is working for her. She thinks perhaps a Lexapro worked better. She denies being significantly depressed. Her sleep is okay but she often wakes up.  The patient does state she has several  stressors in her life. She is very hard on herself in terms of her work International aid/development workerperformance. 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 She is responsible for 30 other employees and feels overwhelmed by this at times. She and her boyfriend have had struggles and she's not sure she wants to stay in the relationship and has even tried dating other people which made him angry. They have a child together so she is trying to work this out. She denies suicidal ideation but her energy is a bit low and she doesn't get any exercise. Sometimes she wishes she could do something else with her life but has no time given her ride to work as well as the long hours. She's never had any previous psychiatric treatment or counseling  The patient returns after 3 months. She tells me today that she is [redacted] weeks pregnant. It's a complicated situation because her current boyfriend is probably not the father of the baby. She was dating another man but now he wants nothing to do with her and is engaged to someone else. Her boyfriend was initially angry about all this but he is accepted it and they're still living together with her other child. She states that she is quite stressed about the whole situation but wants to keep the baby. She denies being significantly depressed and has remained on Lexapro 20 mg. She is very anxious however particularly in the evenings. She's had trouble keeping fluid down and  had to go to the ER one time for dehydration. She is doing better with this now. She denies being suicidal. I suggested that we increase the Lexapro little bit to help with her anxiety and she is now past the first trimester. She only uses the Xanax about once a week  Associated Signs/Symptoms: Depression Symptoms:  depressed mood, psychomotor retardation, fatigue, feelings of worthlessness/guilt, anxiety, panic attacks, (Hypo) Manic Symptoms:   Anxiety Symptoms:  Panic  Symptoms, Psychotic Symptoms:   PTSD Symptoms:   Past Psychiatric History: none  Previous Psychotropic Medications: Yes   Substance Abuse History in the last 12 months:  No.  Consequences of Substance Abuse: NA  Past Medical History:  Past Medical History:  Diagnosis Date  . Anxiety   . Depression     Past Surgical History:  Procedure Laterality Date  . WISDOM TOOTH EXTRACTION      Family Psychiatric History: The patient's mother and aunt have anxiety and panic disorder and depression. Maternal grandfather has a history of alcohol abuse and anxiety. The patient's biological father has a history of substance abuse from [place] with pain medications  Family History:  Family History  Problem Relation Age of Onset  . Depression Mother   . Anxiety disorder Mother   . Drug abuse Father   . Anxiety disorder Maternal Aunt   . Alcohol abuse Maternal Grandfather   . Anxiety disorder Maternal Grandfather     Social History:   Social History   Social History  . Marital status: Single    Spouse name: N/A  . Number of children: N/A  . Years of education: N/A   Social History Main Topics  . Smoking status: Former Smoker    Packs/day: 0.50    Years: 4.00    Types: Cigarettes    Quit date: 06/29/2016  . Smokeless tobacco: Never Used  . Alcohol use Yes     Comment: 3 beers 1 x per week  . Drug use: No     Comment: 05-21-2016 per pt 2 yrs ago  . Sexual activity: Yes    Birth control/ protection: Condom   Other Topics Concern  . None   Social History Narrative  . None    Additional Social History: The patient grew up in [place]. Her parents divorced when she was 5 years old. Her mother remarried and she grew up in a blended family. She has 4 brothers and one sister. She finished high school but never went to college and went to work for [employer] directly after high school. She's been living with her current boyfriend for 4 years and she has a 78-year-old son. She denies any  history of trauma or abuse throughout her life  Allergies:   Allergies  Allergen Reactions  . Ciprofloxacin Other (See Comments)    Tendonitis   . Penicillins Hives    Has patient had a PCN reaction causing immediate rash, facial/tongue/throat swelling, SOB or lightheadedness with hypotension: No Has patient had a PCN reaction causing severe rash involving mucus membranes or skin necrosis: No Has patient had a PCN reaction that required hospitalization No Has patient had a PCN reaction occurring within the last 10 years: No If all of the above answers are "NO", then may proceed with Cephalosporin use.     Metabolic Disorder Labs: No results found for: HGBA1C, MPG No results found for: PROLACTIN No results found for: CHOL, TRIG, HDL, CHOLHDL, VLDL, LDLCALC   Current Medications: Current Outpatient Prescriptions  Medication Sig  Dispense Refill  . ALPRAZolam (XANAX) 0.25 MG tablet Take 1 tablet (0.25 mg total) by mouth 2 (two) times daily as needed for anxiety (Use one tablet as needed for severe anxiety.). 60 tablet 2  . escitalopram (LEXAPRO) 20 MG tablet Take 1 tablet (20 mg total) by mouth daily. 30 tablet 2  . escitalopram (LEXAPRO) 10 MG tablet Take 1 tablet (10 mg total) by mouth daily. 30 tablet 2   No current facility-administered medications for this visit.     Neurologic: Headache: Yes Seizure: No Paresthesias:No  Musculoskeletal: Strength & Muscle Tone: within normal limits Gait & Station: normal Patient leans: N/A  Psychiatric Specialty Exam: Review of Systems  Cardiovascular: Positive for chest pain and palpitations.  Neurological: Positive for tingling.  Psychiatric/Behavioral: Positive for depression. The patient is nervous/anxious.   All other systems reviewed and are negative.   Blood pressure 108/68, pulse 86, height 5\' 2"  (1.575 m), weight 176 lb 9.6 oz (80.1 kg), last menstrual period 05/23/2016.Body mass index is 32.3 kg/m.  General Appearance:  Casual, Neat and Well Groomed  Eye Contact:  Good  Speech:  Clear and Coherent  Volume:  Normal  Mood:Anxious   Affect:Congruent   Thought Process:  Goal Directed  Orientation:  Full (Time, Place, and Person)  Thought Content:  Rumination  Suicidal Thoughts:  No  Homicidal Thoughts:  No  Memory:  Immediate;   Good Recent;   Good Remote;   Good  Judgement:  Good  Insight:  Fair  Psychomotor Activity:  Normal  Concentration:  Concentration: Fair and Attention Span: Fair  Recall:  Good  Fund of Knowledge:Good  Language: Good  Akathisia:  No  Handed:  Right  AIMS (if indicated):    Assets:  Communication Skills Desire for Improvement Physical Health Resilience Social Support Talents/Skills  ADL's:  Intact  Cognition: WNL  Sleep:  ok    Treatment Plan Summary: Medication management  The patient will continue Lexapro 20 mg dailyAnd we will add 10 mg later in the day as well as Xanax 0.25 mg daily as needed. She'll continue her counseling and return to see me in 6 weeks   [name], MD 1/29/20188:57 AM

## 2016-08-27 NOTE — Telephone Encounter (Signed)
[facility] call from [facility] [place].

## 2016-09-12 ENCOUNTER — Ambulatory Visit (HOSPITAL_COMMUNITY): Payer: Self-pay | Admitting: Psychiatry

## 2016-09-26 DIAGNOSIS — Z3401 Encounter for supervision of normal first pregnancy, first trimester: Secondary | ICD-10-CM | POA: Diagnosis not present

## 2016-09-26 DIAGNOSIS — Z0389 Encounter for observation for other suspected diseases and conditions ruled out: Secondary | ICD-10-CM | POA: Diagnosis not present

## 2016-09-26 DIAGNOSIS — Z3201 Encounter for pregnancy test, result positive: Secondary | ICD-10-CM | POA: Diagnosis not present

## 2016-09-26 DIAGNOSIS — Z3689 Encounter for other specified antenatal screening: Secondary | ICD-10-CM | POA: Diagnosis not present

## 2016-10-08 ENCOUNTER — Ambulatory Visit (HOSPITAL_COMMUNITY): Payer: Self-pay | Admitting: Psychiatry

## 2016-10-11 ENCOUNTER — Encounter (HOSPITAL_COMMUNITY): Payer: Self-pay | Admitting: Psychiatry

## 2016-10-11 ENCOUNTER — Ambulatory Visit (INDEPENDENT_AMBULATORY_CARE_PROVIDER_SITE_OTHER): Payer: BLUE CROSS/BLUE SHIELD | Admitting: Psychiatry

## 2016-10-11 VITALS — BP 110/68 | Ht 62.0 in | Wt 188.0 lb

## 2016-10-11 DIAGNOSIS — Z79899 Other long term (current) drug therapy: Secondary | ICD-10-CM | POA: Diagnosis not present

## 2016-10-11 DIAGNOSIS — Z3A20 20 weeks gestation of pregnancy: Secondary | ICD-10-CM | POA: Diagnosis not present

## 2016-10-11 DIAGNOSIS — Z813 Family history of other psychoactive substance abuse and dependence: Secondary | ICD-10-CM

## 2016-10-11 DIAGNOSIS — Z811 Family history of alcohol abuse and dependence: Secondary | ICD-10-CM

## 2016-10-11 DIAGNOSIS — Z87891 Personal history of nicotine dependence: Secondary | ICD-10-CM | POA: Diagnosis not present

## 2016-10-11 DIAGNOSIS — Z818 Family history of other mental and behavioral disorders: Secondary | ICD-10-CM

## 2016-10-11 DIAGNOSIS — F410 Panic disorder [episodic paroxysmal anxiety] without agoraphobia: Secondary | ICD-10-CM | POA: Diagnosis not present

## 2016-10-11 DIAGNOSIS — Z3689 Encounter for other specified antenatal screening: Secondary | ICD-10-CM | POA: Diagnosis not present

## 2016-10-11 MED ORDER — ESCITALOPRAM OXALATE 20 MG PO TABS: 20.0000 mg | ORAL_TABLET | Freq: Every day | ORAL | 2 refills | Status: DC

## 2016-10-11 NOTE — Progress Notes (Signed)
Psychiatric Initial Adult Assessment   Patient Identification: Kayla Sanchez MRN:  [id number] Date of Evaluation:  10/11/2016 Referral Source: Bennie Pierini, nurse practitioner Chief Complaint:    Visit Diagnosis:    ICD-9-CM ICD-10-CM   1. Panic disorder 300.01 F41.0     History of Present Illness:  This patient is a 21 year old single white female who lives with her boyfriend and 58-year-old son in [place]. She is a Aeronautical engineer at [employer].  The patient was referred by Bennie Pierini, nurse practitioner at [facility] for further treatment and assessment of anxiety panic attacks and depression.  The patient states that she first got treated for depression when she was approximately 15. She was anxious sad sleeping too much and didn't want to go to school. She can't remember any specific triggers for these symptoms. She was put on Celexa through primary care but this did not work so well and then was placed on Zoloft for approximately 2 years which seemed to help. She then went to Lexapro. She wasn't taking it consistently and it was changed to Prozac in August. 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  During the switch between medicines she developed panic attacks which have escalated. She has had them every day now for several weeks, sometimes several times a day. During these she feels shaky with chest pain back pain tachycardia vertigo tremor light sensitivity. She even went to the emergency room once but nothing specific was found and she was given a low dose of Xanax which has helped. She doesn't think the Prozac but she is taking now is working for her. She thinks perhaps a Lexapro worked better. She denies being significantly depressed. Her sleep is okay but she often wakes up.  The patient does state she has several stressors in her life. She is very hard on herself in  terms of her work International aid/development worker. 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 She is responsible for 30 other employees and feels overwhelmed by this at times. She and her boyfriend have had struggles and she's not sure she wants to stay in the relationship and has even tried dating other people which made him angry. They have a child together so she is trying to work this out. She denies suicidal ideation but her energy is a bit low and she doesn't get any exercise. Sometimes she wishes she could do something else with her life but has no time given her ride to work as well as the long hours. She's never had any previous psychiatric treatment or counseling  The patient returns after 6 weeks. She is now [redacted] weeks pregnant. She is doing well on the ultrasounds of the baby have been normal. We are going to increase the Lexapro to 30 mg a day last time but she has stayed at 20 mg and feels fine. She does have a little bit of anxiety in the evenings but tries to lie down and relax rather than take Xanax. Her boyfriend is still accepting of the pregnancy despite the fact that it's not his baby and the man who is the father of the baby works with her but they don't really talk much. It's a rather awkward situation but as she is handling it fairly well. She is worried about effects of Lexapro during pregnancy but she  is past the first trimester and there is really not much to worry about now is the baby's heart's form normally. I explained that the risks of depression during pregnancy or postpartum are far worse.  Associated Signs/Symptoms: Depression Symptoms:  depressed mood, psychomotor retardation, fatigue, feelings of worthlessness/guilt, anxiety, panic attacks, (Hypo) Manic Symptoms:   Anxiety Symptoms:  Panic Symptoms, Psychotic Symptoms:   PTSD Symptoms:   Past Psychiatric History: none  Previous Psychotropic Medications: Yes   Substance Abuse  History in the last 12 months:  No.  Consequences of Substance Abuse: NA  Past Medical History:  Past Medical History:  Diagnosis Date  . Anxiety   . Depression     Past Surgical History:  Procedure Laterality Date  . WISDOM TOOTH EXTRACTION      Family Psychiatric History: The patient's mother and aunt have anxiety and panic disorder and depression. Maternal grandfather has a history of alcohol abuse and anxiety. The patient's biological father has a history of substance abuse from [place] with pain medications  Family History:  Family History  Problem Relation Age of Onset  . Depression Mother   . Anxiety disorder Mother   . Drug abuse Father   . Anxiety disorder Maternal Aunt   . Alcohol abuse Maternal Grandfather   . Anxiety disorder Maternal Grandfather     Social History:   Social History   Social History  . Marital status: Single    Spouse name: N/A  . Number of children: N/A  . Years of education: N/A   Social History Main Topics  . Smoking status: Former Smoker    Packs/day: 0.50    Years: 4.00    Types: Cigarettes    Quit date: 06/29/2016  . Smokeless tobacco: Never Used  . Alcohol use Yes     Comment: 3 beers 1 x per week  . Drug use: No     Comment: 05-21-2016 per pt 2 yrs ago  . Sexual activity: Yes    Birth control/ protection: Condom   Other Topics Concern  . None   Social History Narrative  . None    Additional Social History: The patient grew up in [place]. Her parents divorced when she was 21 years old. Her mother remarried and she grew up in a blended family. She has 4 brothers and one sister. She finished high school but never went to college and went to work for [employer] directly after high school. She's been living with her current boyfriend for 4 years and she has a 23-year-old son. She denies any history of trauma or abuse throughout her life  Allergies:   Allergies  Allergen Reactions  . Ciprofloxacin Other (See Comments)     Tendonitis   . Penicillins Hives    Has patient had a PCN reaction causing immediate rash, facial/tongue/throat swelling, SOB or lightheadedness with hypotension: No Has patient had a PCN reaction causing severe rash involving mucus membranes or skin necrosis: No Has patient had a PCN reaction that required hospitalization No Has patient had a PCN reaction occurring within the last 10 years: No If all of the above answers are "NO", then may proceed with Cephalosporin use.     Metabolic Disorder Labs: No results found for: HGBA1C, MPG No results found for: PROLACTIN No results found for: CHOL, TRIG, HDL, CHOLHDL, VLDL, LDLCALC   Current Medications: Current Outpatient Prescriptions  Medication Sig Dispense Refill  . ALPRAZolam (XANAX) 0.25 MG tablet Take 1 tablet (0.25 mg total) by mouth  2 (two) times daily as needed for anxiety (Use one tablet as needed for severe anxiety.). 60 tablet 2  . escitalopram (LEXAPRO) 20 MG tablet Take 1 tablet (20 mg total) by mouth daily. 30 tablet 2   No current facility-administered medications for this visit.     Neurologic: Headache: Yes Seizure: No Paresthesias:No  Musculoskeletal: Strength & Muscle Tone: within normal limits Gait & Station: normal Patient leans: N/A  Psychiatric Specialty Exam: Review of Systems  Cardiovascular: Positive for chest pain and palpitations.  Neurological: Positive for tingling.  Psychiatric/Behavioral: Positive for depression. The patient is nervous/anxious.   All other systems reviewed and are negative.   Blood pressure 110/68, height 5\' 2"  (1.575 m), weight 188 lb (85.3 kg), last menstrual period 05/23/2016.Body mass index is 34.39 kg/m.  General Appearance: Casual, Neat and Well Groomed  Eye Contact:  Good  Speech:  Clear and Coherent  Mood: Good   Affect: Bright   Affect:Congruent   Thought Process:  Goal Directed  Orientation:  Full (Time, Place, and Person)  Thought Content:  Rumination   Suicidal Thoughts:  No  Homicidal Thoughts:  No  Memory:  Immediate;   Good Recent;   Good Remote;   Good  Judgement:  Good  Insight:  Fair  Psychomotor Activity:  Normal  Concentration:  Concentration: Fair and Attention Span: Fair  Recall:  Good  Fund of Knowledge:Good  Language: Good  Akathisia:  No  Handed:  Right  AIMS (if indicated):    Assets:  Communication Skills Desire for Improvement Physical Health Resilience Social Support Talents/Skills  ADL's:  Intact  Cognition: WNL  Sleep:  ok    Treatment Plan Summary: Medication management  The patient will continue Lexapro 20 mg daily as well as Xanax 0.25 mg daily as needed. She'll turn to see me in 3 months but call sooner if any problems arise   [name], MD 3/15/201811:20 AM

## 2016-10-12 ENCOUNTER — Telehealth (HOSPITAL_COMMUNITY): Payer: Self-pay

## 2016-10-12 DIAGNOSIS — F410 Panic disorder [episodic paroxysmal anxiety] without agoraphobia: Secondary | ICD-10-CM

## 2016-10-12 MED ORDER — ESCITALOPRAM OXALATE 20 MG PO TABS: 20.0000 mg | ORAL_TABLET | Freq: Every day | ORAL | 0 refills | Status: DC

## 2016-10-12 NOTE — Telephone Encounter (Signed)
Yes that will be fine. 

## 2016-10-12 NOTE — Telephone Encounter (Signed)
Medication management - Telephone call with patient after receiving a fax from [facility] requesting an order for patient's Lexapro.  Patient stated she does want to start getting medication from [facility] and would like a 90 day supply.  Requests to still get a 30 day supply from [facility] as states she will run out before 90 day supply can be delivered. Met with Dr. Harrington Challenger who authorized a 90 day order for [facility] and to leave one 30 day order with [facility] for patient to use until this is received.  A new 90 day order e-scribed to [facility] and called [name] in [place] to cancel refills for Lexapro but to leave one 30 day order patient will pick up. Callled and left patient a message, after calling [facility] in [place] and speaking with [place], pharmacist to inform 90 day order sent to [facility]. Informed to discontinue order from 10/11/16 Lexapro per Dr. Harrington Challenger except for one 30 day order as patient would run out prior to receiving mail order supply.  Agreed with Arrie Aran, pharmacist to inform patient she would need to call her insurance company to request a one time authorization to fill 30 days there at [facility] and this message left for patient with request to call our office back if any other problems getting medication.

## 2016-10-12 NOTE — Telephone Encounter (Signed)
Medication refill request - [facility] requesting Esc[facility]ram orders be sent to them for home delivery.  Patient seen 09/27/16 and [facility].

## 2016-10-15 NOTE — Telephone Encounter (Signed)
noted 

## 2016-11-28 DIAGNOSIS — M545 Low back pain: Secondary | ICD-10-CM | POA: Diagnosis not present

## 2016-12-05 DIAGNOSIS — Z3483 Encounter for supervision of other normal pregnancy, third trimester: Secondary | ICD-10-CM | POA: Diagnosis not present

## 2016-12-05 DIAGNOSIS — Z6791 Unspecified blood type, Rh negative: Secondary | ICD-10-CM | POA: Diagnosis not present

## 2016-12-05 DIAGNOSIS — Z3A28 28 weeks gestation of pregnancy: Secondary | ICD-10-CM | POA: Diagnosis not present

## 2016-12-05 DIAGNOSIS — Z3689 Encounter for other specified antenatal screening: Secondary | ICD-10-CM | POA: Diagnosis not present

## 2016-12-05 DIAGNOSIS — O09893 Supervision of other high risk pregnancies, third trimester: Secondary | ICD-10-CM | POA: Diagnosis not present

## 2016-12-09 ENCOUNTER — Other Ambulatory Visit (HOSPITAL_COMMUNITY): Payer: Self-pay | Admitting: Psychiatry

## 2016-12-09 DIAGNOSIS — F410 Panic disorder [episodic paroxysmal anxiety] without agoraphobia: Secondary | ICD-10-CM

## 2016-12-11 ENCOUNTER — Telehealth (HOSPITAL_COMMUNITY): Payer: Self-pay | Admitting: *Deleted

## 2016-12-11 NOTE — Telephone Encounter (Signed)
Ref. # [id number].  voice message from [facility].  Please call them regarding Lexapro.

## 2017-01-11 ENCOUNTER — Ambulatory Visit (INDEPENDENT_AMBULATORY_CARE_PROVIDER_SITE_OTHER): Payer: BLUE CROSS/BLUE SHIELD | Admitting: Psychiatry

## 2017-01-11 ENCOUNTER — Encounter (HOSPITAL_COMMUNITY): Payer: Self-pay | Admitting: Psychiatry

## 2017-01-11 VITALS — BP 110/70 | HR 103 | Ht 62.0 in | Wt 208.2 lb

## 2017-01-11 DIAGNOSIS — Z881 Allergy status to other antibiotic agents status: Secondary | ICD-10-CM | POA: Diagnosis not present

## 2017-01-11 DIAGNOSIS — Z3A33 33 weeks gestation of pregnancy: Secondary | ICD-10-CM | POA: Diagnosis not present

## 2017-01-11 DIAGNOSIS — Z79899 Other long term (current) drug therapy: Secondary | ICD-10-CM

## 2017-01-11 DIAGNOSIS — Z814 Family history of other substance abuse and dependence: Secondary | ICD-10-CM | POA: Diagnosis not present

## 2017-01-11 DIAGNOSIS — Z811 Family history of alcohol abuse and dependence: Secondary | ICD-10-CM | POA: Diagnosis not present

## 2017-01-11 DIAGNOSIS — Z880 Allergy status to penicillin: Secondary | ICD-10-CM | POA: Diagnosis not present

## 2017-01-11 DIAGNOSIS — Z87891 Personal history of nicotine dependence: Secondary | ICD-10-CM | POA: Diagnosis not present

## 2017-01-11 DIAGNOSIS — O99343 Other mental disorders complicating pregnancy, third trimester: Secondary | ICD-10-CM

## 2017-01-11 DIAGNOSIS — Z818 Family history of other mental and behavioral disorders: Secondary | ICD-10-CM | POA: Diagnosis not present

## 2017-01-11 DIAGNOSIS — F410 Panic disorder [episodic paroxysmal anxiety] without agoraphobia: Secondary | ICD-10-CM

## 2017-01-11 MED ORDER — ALPRAZOLAM 0.25 MG PO TABS: 0.2500 mg | ORAL_TABLET | Freq: Two times a day (BID) | ORAL | 2 refills | Status: DC | PRN

## 2017-01-11 MED ORDER — ESCITALOPRAM OXALATE 20 MG PO TABS: 20.0000 mg | ORAL_TABLET | Freq: Every day | ORAL | 0 refills | Status: DC

## 2017-01-11 NOTE — Progress Notes (Signed)
Psychiatric Initial Adult Assessment   Patient Identification: Kayla Sanchez MRN:  [id number] Date of Evaluation:  01/11/2017 Referral Source: Bennie Pierini, nurse practitioner Chief Complaint:   Chief Complaint    Depression; Anxiety; Follow-up     Visit Diagnosis:    ICD-10-CM   1. Panic disorder F41.0 escitalopram (LEXAPRO) 20 MG tablet    History of Present Illness:  This patient is a 21 year old single white female who lives with her boyfriend and 55-year-old son in [place]. She is a Aeronautical engineer at [employer].  The patient was referred by Bennie Pierini, nurse practitioner at [facility] family medicine for further treatment and assessment of anxiety panic attacks and depression.  The patient states that she first got treated for depression when she was approximately 15. She was anxious sad sleeping too much and didn't want to go to school. She can't remember any specific triggers for these symptoms. She was put on Celexa through primary care but this did not work so well and then was placed on Zoloft for approximately 2 years which seemed to help. She then went to Lexapro. She wasn't taking it consistently and it was changed to Prozac in August. 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  During the switch between medicines she developed panic attacks which have escalated. She has had them every day now for several weeks, sometimes several times a day. During these she feels shaky with chest pain back pain tachycardia vertigo tremor light sensitivity. She even went to the emergency room once but nothing specific was found and she was given a low dose of Xanax which has helped. She doesn't think the Prozac but she is taking now is working for her. She thinks perhaps a Lexapro worked better. She denies being significantly depressed. Her sleep is okay but she often wakes up.  The patient does  state she has several stressors in her life. She is very hard on herself in terms of her work International aid/development worker. 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 She is responsible for 30 other employees and feels overwhelmed by this at times. She and her boyfriend have had struggles and she's not sure she wants to stay in the relationship and has even tried dating other people which made him angry. They have a child together so she is trying to work this out. She denies suicidal ideation but her energy is a bit low and she doesn't get any exercise. Sometimes she wishes she could do something else with her life but has no time given her ride to work as well as the long hours. She's never had any previous psychiatric treatment or counseling  The patient returns after  3 months. She is now [redacted] weeks pregnant. So far she is doing well except for some feet swelling. The ultrasound the baby up in normal. Her boyfriend is very supportive despite the fact that he is not the father of the baby. She's not had much anxiety but worries that she'll have the panic attacks come back after she has the baby. She is going to try to breast-feed. I told her we would keep a close eye on this and she can renew the Xanax after pregnancy. She denies any symptoms of depression and she is eating and sleeping well and her mood is good. She is still working 19  hours a week  Associated Signs/Symptoms: Depression Symptoms:  depressed mood, psychomotor retardation, fatigue, feelings of worthlessness/guilt, anxiety, panic attacks, (Hypo) Manic Symptoms:   Anxiety Symptoms:  Panic Symptoms, Psychotic Symptoms:   PTSD Symptoms:   Past Psychiatric History: none  Previous Psychotropic Medications: Yes   Substance Abuse History in the last 12 months:  No.  Consequences of Substance Abuse: NA  Past Medical History:  Past Medical History:  Diagnosis Date  . Anxiety   . Depression      Past Surgical History:  Procedure Laterality Date  . WISDOM TOOTH EXTRACTION      Family Psychiatric History: The patient's mother and aunt have anxiety and panic disorder and depression. Maternal grandfather has a history of alcohol abuse and anxiety. The patient's biological father has a history of substance abuse from [place] with pain medications  Family History:  Family History  Problem Relation Age of Onset  . Depression Mother   . Anxiety disorder Mother   . Drug abuse Father   . Anxiety disorder Maternal Aunt   . Alcohol abuse Maternal Grandfather   . Anxiety disorder Maternal Grandfather     Social History:   Social History   Social History  . Marital status: Single    Spouse name: N/A  . Number of children: N/A  . Years of education: N/A   Social History Main Topics  . Smoking status: Former Smoker    Packs/day: 0.50    Years: 4.00    Types: Cigarettes    Quit date: 06/29/2016  . Smokeless tobacco: Never Used  . Alcohol use Yes     Comment: 3 beers 1 x per week  . Drug use: No     Comment: 05-21-2016 per pt 2 yrs ago  . Sexual activity: Yes    Birth control/ protection: Condom   Other Topics Concern  . None   Social History Narrative  . None    Additional Social History: The patient grew up in [place]. Her parents divorced when she was 21 years old. Her mother remarried and she grew up in a blended family. She has 4 brothers and one sister. She finished high school but never went to college and went to work for [employer] directly after high school. She's been living with her current boyfriend for 4 years and she has a 23-year-old son. She denies any history of trauma or abuse throughout her life  Allergies:   Allergies  Allergen Reactions  . Ciprofloxacin Other (See Comments)    Tendonitis   . Penicillins Hives    Has patient had a PCN reaction causing immediate rash, facial/tongue/throat swelling, SOB or lightheadedness with hypotension: No Has patient  had a PCN reaction causing severe rash involving mucus membranes or skin necrosis: No Has patient had a PCN reaction that required hospitalization No Has patient had a PCN reaction occurring within the last 10 years: No If all of the above answers are "NO", then may proceed with Cephalosporin use.     Metabolic Disorder Labs: No results found for: HGBA1C, MPG No results found for: PROLACTIN No results found for: CHOL, TRIG, HDL, CHOLHDL, VLDL, LDLCALC   Current Medications: Current Outpatient Prescriptions  Medication Sig Dispense Refill  . ALPRAZolam (XANAX) 0.25 MG tablet Take 1 tablet (0.25 mg total) by mouth 2 (two) times daily as needed for anxiety (Use one tablet as needed for severe anxiety.). 60 tablet 2  . escitalopram (LEXAPRO) 20 MG tablet Take 1 tablet (20 mg total)  by mouth daily. 90 tablet 0   No current facility-administered medications for this visit.     Neurologic: Headache: Yes Seizure: No Paresthesias:No  Musculoskeletal: Strength & Muscle Tone: within normal limits Gait & Station: normal Patient leans: N/A  Psychiatric Specialty Exam: Review of Systems  Cardiovascular: Positive for chest pain and palpitations.  Neurological: Positive for tingling.  Psychiatric/Behavioral: Positive for depression. The patient is nervous/anxious.   All other systems reviewed and are negative.   Blood pressure 110/70, pulse (!) 103, height 5\' 2"  (1.575 m), weight 208 lb 3.2 oz (94.4 kg), last menstrual period 05/23/2016, SpO2 99 %.Body mass index is 38.08 kg/m.  General Appearance: Casual, Neat and Well Groomed  Eye Contact:  Good  Speech:  Clear and Coherent  Mood: Good   Affect: Bright   Affect:Congruent   Thought Process:  Goal Directed  Orientation:  Full (Time, Place, and Person)  Thought Content:  Rumination  Suicidal Thoughts:  No  Homicidal Thoughts:  No  Memory:  Immediate;   Good Recent;   Good Remote;   Good  Judgement:  Good  Insight:  Fair   Psychomotor Activity:  Normal  Concentration:  Concentration: Fair and Attention Span: Fair  Recall:  Good  Fund of Knowledge:Good  Language: Good  Akathisia:  No  Handed:  Right  AIMS (if indicated):    Assets:  Communication Skills Desire for Improvement Physical Health Resilience Social Support Talents/Skills  ADL's:  Intact  Cognition: WNL  Sleep:  ok    Treatment Plan Summary: Medication management  The patient will continue Lexapro 20 mg daily. She has been given a new prescription for Xanax so she can have it if she needs it postpartum. She'll turn to see me in 2 months but call sooner if any problems arise   [name], MD 6/15/20188:48 AM

## 2017-01-17 DIAGNOSIS — Z3685 Encounter for antenatal screening for Streptococcus B: Secondary | ICD-10-CM | POA: Diagnosis not present

## 2017-02-22 DIAGNOSIS — Z3689 Encounter for other specified antenatal screening: Secondary | ICD-10-CM | POA: Diagnosis not present

## 2017-02-26 DIAGNOSIS — F329 Major depressive disorder, single episode, unspecified: Secondary | ICD-10-CM | POA: Diagnosis not present

## 2017-02-26 DIAGNOSIS — O4103X0 Oligohydramnios, third trimester, not applicable or unspecified: Secondary | ICD-10-CM | POA: Diagnosis not present

## 2017-02-26 DIAGNOSIS — O134 Gestational [pregnancy-induced] hypertension without significant proteinuria, complicating childbirth: Secondary | ICD-10-CM | POA: Diagnosis not present

## 2017-02-26 DIAGNOSIS — O26893 Other specified pregnancy related conditions, third trimester: Secondary | ICD-10-CM | POA: Diagnosis not present

## 2017-02-26 DIAGNOSIS — Z6741 Type O blood, Rh negative: Secondary | ICD-10-CM | POA: Diagnosis not present

## 2017-02-26 DIAGNOSIS — Z23 Encounter for immunization: Secondary | ICD-10-CM | POA: Diagnosis not present

## 2017-02-26 DIAGNOSIS — Z3A39 39 weeks gestation of pregnancy: Secondary | ICD-10-CM | POA: Diagnosis not present

## 2017-02-26 DIAGNOSIS — F419 Anxiety disorder, unspecified: Secondary | ICD-10-CM | POA: Diagnosis not present

## 2017-02-26 DIAGNOSIS — Z880 Allergy status to penicillin: Secondary | ICD-10-CM | POA: Diagnosis not present

## 2017-02-26 DIAGNOSIS — Z79899 Other long term (current) drug therapy: Secondary | ICD-10-CM | POA: Diagnosis not present

## 2017-02-26 DIAGNOSIS — O9902 Anemia complicating childbirth: Secondary | ICD-10-CM | POA: Diagnosis not present

## 2017-02-26 DIAGNOSIS — O99344 Other mental disorders complicating childbirth: Secondary | ICD-10-CM | POA: Diagnosis not present

## 2017-02-26 DIAGNOSIS — Z881 Allergy status to other antibiotic agents status: Secondary | ICD-10-CM | POA: Diagnosis not present

## 2017-02-26 DIAGNOSIS — D649 Anemia, unspecified: Secondary | ICD-10-CM | POA: Diagnosis not present

## 2017-03-01 DIAGNOSIS — E876 Hypokalemia: Secondary | ICD-10-CM | POA: Diagnosis not present

## 2017-03-01 DIAGNOSIS — Z79899 Other long term (current) drug therapy: Secondary | ICD-10-CM | POA: Diagnosis not present

## 2017-03-01 DIAGNOSIS — O9089 Other complications of the puerperium, not elsewhere classified: Secondary | ICD-10-CM | POA: Diagnosis not present

## 2017-03-01 DIAGNOSIS — O165 Unspecified maternal hypertension, complicating the puerperium: Secondary | ICD-10-CM | POA: Diagnosis not present

## 2017-03-01 DIAGNOSIS — F419 Anxiety disorder, unspecified: Secondary | ICD-10-CM | POA: Diagnosis not present

## 2017-03-11 ENCOUNTER — Ambulatory Visit (INDEPENDENT_AMBULATORY_CARE_PROVIDER_SITE_OTHER): Payer: BLUE CROSS/BLUE SHIELD | Admitting: Psychiatry

## 2017-03-11 ENCOUNTER — Encounter (HOSPITAL_COMMUNITY): Payer: Self-pay | Admitting: Psychiatry

## 2017-03-11 VITALS — BP 112/82 | Ht 62.0 in | Wt 186.0 lb

## 2017-03-11 DIAGNOSIS — Z87891 Personal history of nicotine dependence: Secondary | ICD-10-CM

## 2017-03-11 DIAGNOSIS — Z811 Family history of alcohol abuse and dependence: Secondary | ICD-10-CM

## 2017-03-11 DIAGNOSIS — F410 Panic disorder [episodic paroxysmal anxiety] without agoraphobia: Secondary | ICD-10-CM | POA: Diagnosis not present

## 2017-03-11 DIAGNOSIS — Z630 Problems in relationship with spouse or partner: Secondary | ICD-10-CM

## 2017-03-11 DIAGNOSIS — R002 Palpitations: Secondary | ICD-10-CM | POA: Diagnosis not present

## 2017-03-11 DIAGNOSIS — R079 Chest pain, unspecified: Secondary | ICD-10-CM | POA: Diagnosis not present

## 2017-03-11 DIAGNOSIS — Z813 Family history of other psychoactive substance abuse and dependence: Secondary | ICD-10-CM

## 2017-03-11 DIAGNOSIS — Z818 Family history of other mental and behavioral disorders: Secondary | ICD-10-CM

## 2017-03-11 MED ORDER — ESCITALOPRAM OXALATE 20 MG PO TABS: 20.0000 mg | ORAL_TABLET | Freq: Every day | ORAL | 0 refills | Status: DC

## 2017-03-11 NOTE — Progress Notes (Signed)
Psychiatric Initial Adult Assessment   Patient Identification: Kayla Sanchez MRN:  [id number] Date of Evaluation:  03/11/2017 Referral Source: [name], nurse practitioner Chief Complaint:   Chief Complaint    Depression; Anxiety; Follow-up     Visit Diagnosis:    ICD-10-CM   1. Panic disorder F41.0 escitalopram (LEXAPRO) 20 MG tablet    History of Present Illness:  This patient is a 21 year old single white female who lives [place] her boyfriend [place] 565-year[place]old [place]. 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  The patient was referred by [name], nurse practitioner at [facility] for further treatment and assessment of anxiety panic attacks and depression.  The patient states that she first got treated for depression when she was approximately 15. She was anxious sad sleeping too much and didn't want to go to school. She can't remember any specific triggers for these symptoms. She was put on Celexa through primary care but this did not work so well and then was placed on Zoloft for approximately 2 years which seemed to help. She then went to Lexapro. She wasn't taking it consistently and it was changed to Prozac in August. 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  During the switch between medicines she developed panic attacks which have escalated. She has had them every day now for several weeks, sometimes several times a day. During these she feels shaky with chest pain back pain tachycardia vertigo tremor light sensitivity. She even went to the emergency room once but nothing specific was found and she was given a low dose of Xanax which has helped. She doesn't think the Prozac but she is taking now is working for her. She thinks perhaps a Lexapro worked better. She denies being significantly depressed. Her sleep is okay but she often wakes up.  The patient does  state she has several stressors in her life. She is very hard on herself in terms of her work International aid/development workerperformance. 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 She is responsible for 30 other employees and feels overwhelmed by this at times. She and her boyfriend have had struggles and she's not sure she wants to stay in the relationship and has even tried dating other people which made him angry. They have a child together so she is trying to work this out. She denies suicidal ideation but her energy is a bit low and she doesn't get any exercise. Sometimes she wishes she could do something else with her life but has no time given her ride to work as well as the long hours. She's never had any previous psychiatric treatment or counseling  The patient returns after  3 months. She gave birth to a baby girl on August 1. The pregnancy went well that the baby has been somewhat colicky and cries all night and needs to be held in order to sleep. She is very tired and has been a little bit depressed. Her boyfriend has been helpful but he has to work. Both sets of grandparents have been helpful as well. The man was the real father of the baby doesn't want anything to do with her which has been disconcerting. Right now she is on maternity leave until mid-September. She denies suicidal ideation aren't serious depression but will let me know if anything gets worse. She  thinks the main problem is the lack of sleep. She is going to need to experiment with different formulas to find out what will help the baby sleep better Associated Signs/Symptoms: Depression Symptoms:  depressed mood, psychomotor retardation, fatigue, feelings of worthlessness/guilt, anxiety, panic attacks, (Hypo) Manic Symptoms:   Anxiety Symptoms:  Panic Symptoms, Psychotic Symptoms:   PTSD Symptoms:   Past Psychiatric History: none  Previous Psychotropic Medications: Yes   Substance Abuse  History in the last 12 months:  No.  Consequences of Substance Abuse: NA  Past Medical History:  Past Medical History:  Diagnosis Date  . Anxiety   . Depression     Past Surgical History:  Procedure Laterality Date  . WISDOM TOOTH EXTRACTION      Family Psychiatric History: The patient's mother and aunt have anxiety and panic disorder and depression. Maternal grandfather has a history of alcohol abuse and anxiety. The patient's biological father has a history of substance abuse from [place] with pain medications  Family History:  Family History  Problem Relation Age of Onset  . Depression Mother   . Anxiety disorder Mother   . Drug abuse Father   . Anxiety disorder Maternal Aunt   . Alcohol abuse Maternal Grandfather   . Anxiety disorder Maternal Grandfather     Social History:   Social History   Social History  . Marital status: Single    Spouse name: N/A  . Number of children: N/A  . Years of education: N/A   Social History Main Topics  . Smoking status: Former Smoker    Packs/day: 0.50    Years: 4.00    Types: Cigarettes    Quit date: 06/29/2016  . Smokeless tobacco: Never Used  . Alcohol use Yes     Comment: 3 beers 1 x per week  . Drug use: No     Comment: 05-21-2016 per pt 2 yrs ago  . Sexual activity: Yes    Birth control/ protection: Condom   Other Topics Concern  . None   Social History Narrative  . None    Additional Social History: The patient grew up in [place]. Her parents divorced when she was 69 years old. Her mother remarried and she grew up in a blended family. She has 4 brothers and one sister. She finished high school but never went to college and went to work for [employer] directly after high school. She's been living with her current boyfriend for 4 years and she has a 72-year-old son. She denies any history of trauma or abuse throughout her life  Allergies:   Allergies  Allergen Reactions  . Ciprofloxacin Other (See Comments)     Tendonitis   . Penicillins Hives    Has patient had a PCN reaction causing immediate rash, facial/tongue/throat swelling, SOB or lightheadedness with hypotension: No Has patient had a PCN reaction causing severe rash involving mucus membranes or skin necrosis: No Has patient had a PCN reaction that required hospitalization No Has patient had a PCN reaction occurring within the last 10 years: No If all of the above answers are "NO", then may proceed with Cephalosporin use.     Metabolic Disorder Labs: No results found for: HGBA1C, MPG No results found for: PROLACTIN No results found for: CHOL, TRIG, HDL, CHOLHDL, VLDL, LDLCALC   Current Medications: Current Outpatient Prescriptions  Medication Sig Dispense Refill  . ALPRAZolam (XANAX) 0.25 MG tablet Take 1 tablet (0.25 mg total) by mouth 2 (two) times daily as needed for  anxiety (Use one tablet as needed for severe anxiety.). 60 tablet 2  . escitalopram (LEXAPRO) 20 MG tablet Take 1 tablet (20 mg total) by mouth daily. 90 tablet 0   No current facility-administered medications for this visit.     Neurologic: Headache: Yes Seizure: No Paresthesias:No  Musculoskeletal: Strength & Muscle Tone: within normal limits Gait & Station: normal Patient leans: N/A  Psychiatric Specialty Exam: Review of Systems  Cardiovascular: Positive for chest pain and palpitations.  Neurological: Positive for tingling.  Psychiatric/Behavioral: Positive for depression. The patient is nervous/anxious.   All other systems reviewed and are negative.   Blood pressure 112/82, height 5\' 2"  (1.575 m), weight 186 lb (84.4 kg), last menstrual period 05/23/2016.Body mass index is 34.02 kg/m.  General Appearance: Casual, Neat and Well Groomed  Eye Contact:  Good  Speech:  Clear and Coherent  Mood: Good,A little anxious   Affect: Bright   Affect:Congruent   Thought Process:  Goal Directed  Orientation:  Full (Time, Place, and Person)  Thought Content:   Rumination  Suicidal Thoughts:  No  Homicidal Thoughts:  No  Memory:  Immediate;   Good Recent;   Good Remote;   Good  Judgement:  Good  Insight:  Fair  Psychomotor Activity:  Normal  Concentration:  Concentration: Fair and Attention Span: Fair  Recall:  Good  Fund of Knowledge:Good  Language: Good  Akathisia:  No  Handed:  Right  AIMS (if indicated):    Assets:  Communication Skills Desire for Improvement Physical Health Resilience Social Support Talents/Skills  ADL's:  Intact  Cognition: WNL  Sleep:  ok    Treatment Plan Summary: Medication management  The patient will continue Lexapro 20 mg daily. She Uses Xanax only occasionally She'll turn to see me in 6 weeks but call sooner if any problems arise   [name], MD 8/13/20182:49 PM

## 2017-04-01 ENCOUNTER — Other Ambulatory Visit (HOSPITAL_COMMUNITY): Payer: Self-pay | Admitting: Psychiatry

## 2017-04-01 DIAGNOSIS — F410 Panic disorder [episodic paroxysmal anxiety] without agoraphobia: Secondary | ICD-10-CM

## 2017-04-10 DIAGNOSIS — Z30430 Encounter for insertion of intrauterine contraceptive device: Secondary | ICD-10-CM | POA: Diagnosis not present

## 2017-04-10 DIAGNOSIS — Z309 Encounter for contraceptive management, unspecified: Secondary | ICD-10-CM | POA: Diagnosis not present

## 2017-04-23 ENCOUNTER — Ambulatory Visit (INDEPENDENT_AMBULATORY_CARE_PROVIDER_SITE_OTHER): Payer: BLUE CROSS/BLUE SHIELD | Admitting: Psychiatry

## 2017-04-23 ENCOUNTER — Encounter (HOSPITAL_COMMUNITY): Payer: Self-pay | Admitting: Psychiatry

## 2017-04-23 VITALS — BP 118/68 | HR 80 | Ht 62.0 in | Wt 187.0 lb

## 2017-04-23 DIAGNOSIS — F329 Major depressive disorder, single episode, unspecified: Secondary | ICD-10-CM | POA: Diagnosis not present

## 2017-04-23 DIAGNOSIS — Z818 Family history of other mental and behavioral disorders: Secondary | ICD-10-CM | POA: Diagnosis not present

## 2017-04-23 DIAGNOSIS — F410 Panic disorder [episodic paroxysmal anxiety] without agoraphobia: Secondary | ICD-10-CM | POA: Diagnosis not present

## 2017-04-23 DIAGNOSIS — Z87891 Personal history of nicotine dependence: Secondary | ICD-10-CM | POA: Diagnosis not present

## 2017-04-23 DIAGNOSIS — Z79899 Other long term (current) drug therapy: Secondary | ICD-10-CM | POA: Diagnosis not present

## 2017-04-23 MED ORDER — ESCITALOPRAM OXALATE 20 MG PO TABS: 20.0000 mg | ORAL_TABLET | Freq: Every day | ORAL | 2 refills | Status: DC

## 2017-04-23 NOTE — Progress Notes (Signed)
Psychiatric Initial Adult Assessment   Patient Identification: Kayla Sanchez MRN:  [id number] Date of Evaluation:  04/23/2017 Referral Source: Bennie Pierini, nurse practitioner Chief Complaint:   Chief Complaint    Depression; Anxiety; Follow-up     Visit Diagnosis:    ICD-10-CM   1. Panic disorder F41.0 escitalopram (LEXAPRO) 20 MG tablet    History of Present Illness:  This patient is a 21 year old single white female who lives with her boyfriend and 2- children in [place]. She is a Aeronautical engineer at [employer].  The patient was referred by Bennie Pierini, nurse practitioner at [facility] family medicine for further treatment and assessment of anxiety panic attacks and depression.  The patient states that she first got treated for depression when she was approximately 15. She was anxious sad sleeping too much and didn't want to go to school. She can't remember any specific triggers for these symptoms. She was put on Celexa through primary care but this did not work so well and then was placed on Zoloft for approximately 2 years which seemed to help. She then went to Lexapro. She wasn't taking it consistently and it was changed to Prozac in August. 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  During the switch between medicines she developed panic attacks which have escalated. She has had them every day now for several weeks, sometimes several times a day. During these she feels shaky with chest pain back pain tachycardia vertigo tremor light sensitivity. She even went to the emergency room once but nothing specific was found and she was given a low dose of Xanax which has helped. She doesn't think the Prozac but she is taking now is working for her. She thinks perhaps a Lexapro worked better. She denies being significantly depressed. Her sleep is okay but she often wakes up.  The patient does state  she has several stressors in her life. She is very hard on herself in terms of her work International aid/development worker. 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 She is responsible for 30 other employees and feels overwhelmed by this at times. She and her boyfriend have had struggles and she's not sure she wants to stay in the relationship and has even tried dating other people which made him angry. They have a child together so she is trying to work this out. She denies suicidal ideation but her energy is a bit low and she doesn't get any exercise. Sometimes she wishes she could do something else with her life but has no time given her ride to work as well as the long hours. She's never had any previous psychiatric treatment or counseling  The patient returns after  6 weeks. Her baby is now 25 weeks old and she has returned to work. She states that she had DNA testing done and found that her boyfriend is actually the father of the baby. This is been a big relief to everybody. Her mood has improved and she states for the most part she is sleeping well and her anxiety has diminished. She doesn't use the Xanax at all but likes to have a nearby "just in case." She feels like the Lexapro continues to help her anxiety. Associated Signs/Symptoms: Depression Symptoms:  depressed mood, psychomotor retardation, fatigue, feelings of worthlessness/guilt, anxiety, panic attacks, (Hypo) Manic Symptoms:   Anxiety Symptoms:  Panic Symptoms, Psychotic Symptoms:   PTSD Symptoms:   Past Psychiatric History: none  Previous Psychotropic Medications: Yes   Substance Abuse History in the last 12 months:  No.  Consequences of Substance Abuse: NA  Past Medical History:  Past Medical History:  Diagnosis Date  . Anxiety   . Depression     Past Surgical History:  Procedure Laterality Date  . WISDOM TOOTH EXTRACTION      Family Psychiatric History: The patient's mother  and aunt have anxiety and panic disorder and depression. Maternal grandfather has a history of alcohol abuse and anxiety. The patient's biological father has a history of substance abuse from [place] with pain medications  Family History:  Family History  Problem Relation Age of Onset  . Depression Mother   . Anxiety disorder Mother   . Drug abuse Father   . Anxiety disorder Maternal Aunt   . Alcohol abuse Maternal Grandfather   . Anxiety disorder Maternal Grandfather     Social History:   Social History   Social History  . Marital status: Single    Spouse name: N/A  . Number of children: N/A  . Years of education: N/A   Social History Main Topics  . Smoking status: Former Smoker    Packs/day: 0.50    Years: 4.00    Types: Cigarettes    Quit date: 06/29/2016  . Smokeless tobacco: Never Used  . Alcohol use Yes     Comment: 3 beers 1 x per week  . Drug use: No     Comment: 05-21-2016 per pt 2 yrs ago  . Sexual activity: Yes    Birth control/ protection: Condom   Other Topics Concern  . None   Social History Narrative  . None    Additional Social History: The patient grew up in [place]. Her parents divorced when she was 20 years old. Her mother remarried and she grew up in a blended family. She has 4 brothers and one sister. She finished high school but never went to college and went to work for [employer] directly after high school. She's been living with her current boyfriend for 4 years and she has a 67-year-old son. She denies any history of trauma or abuse throughout her life  Allergies:   Allergies  Allergen Reactions  . Ciprofloxacin Other (See Comments)    Tendonitis   . Penicillins Hives    Has patient had a PCN reaction causing immediate rash, facial/tongue/throat swelling, SOB or lightheadedness with hypotension: No Has patient had a PCN reaction causing severe rash involving mucus membranes or skin necrosis: No Has patient had a PCN reaction that required  hospitalization No Has patient had a PCN reaction occurring within the last 10 years: No If all of the above answers are "NO", then may proceed with Cephalosporin use.     Metabolic Disorder Labs: No results found for: HGBA1C, MPG No results found for: PROLACTIN No results found for: CHOL, TRIG, HDL, CHOLHDL, VLDL, LDLCALC   Current Medications: Current Outpatient Prescriptions  Medication Sig Dispense Refill  . ALPRAZolam (XANAX) 0.25 MG tablet Take 1 tablet (0.25 mg total) by mouth 2 (two) times daily as needed for anxiety (Use one tablet as needed for severe anxiety.). 60 tablet 2  . escitalopram (LEXAPRO) 20 MG tablet Take 1 tablet (20 mg total) by mouth daily. 90 tablet 2   No current facility-administered medications for this visit.     Neurologic: Headache: Yes Seizure: No Paresthesias:No  Musculoskeletal: Strength &  Muscle Tone: within normal limits Gait & Station: normal Patient leans: N/A  Psychiatric Specialty Exam: Review of Systems  Psychiatric/Behavioral: The patient is nervous/anxious.   All other systems reviewed and are negative.   Blood pressure 118/68, pulse 80, height  (1.575 m), weight 187 lb (84.8 kg), last menstrual period 05/23/2016.Body mass index is 34.2 kg/m.  General Appearance: Casual, Neat and Well Groomed  Eye Contact:  Good  Speech:  Clear and Coherent  Mood: Good   Affect: Bright   Affect:Congruent   Thought Process:  Goal Directed  Orientation:  Full (Time, Place, and Person)  Thought Content:  Rumination  Suicidal Thoughts:  No  Homicidal Thoughts:  No  Memory:  Immediate;   Good Recent;   Good Remote;   Good  Judgement:  Good  Insight:  Fair  Psychomotor Activity:  Normal  Concentration:  Concentration: Fair and Attention Span: Fair  Recall:  Good  Fund of Knowledge:Good  Language: Good  Akathisia:  No  Handed:  Right  AIMS (if indicated):    Assets:  Communication Skills Desire for Improvement Physical  Health Resilience Social Support Talents/Skills  ADL's:  Intact  Cognition: WNL  Sleep:  ok    Treatment Plan Summary: Medication management  The patient will continue Lexapro 20 mg daily. She Uses Xanax only occasionally She'll turn to see me in 3 months but call sooner if any problems arise   [name], MD 9/25/20181:43 PM

## 2017-05-08 DIAGNOSIS — Z6835 Body mass index (BMI) 35.0-35.9, adult: Secondary | ICD-10-CM | POA: Diagnosis not present

## 2017-05-08 DIAGNOSIS — Z30431 Encounter for routine checking of intrauterine contraceptive device: Secondary | ICD-10-CM | POA: Diagnosis not present

## 2017-07-10 ENCOUNTER — Encounter: Payer: Self-pay | Admitting: Pediatrics

## 2017-07-10 ENCOUNTER — Ambulatory Visit (INDEPENDENT_AMBULATORY_CARE_PROVIDER_SITE_OTHER): Payer: BLUE CROSS/BLUE SHIELD | Admitting: Pediatrics

## 2017-07-10 VITALS — BP 129/75 | HR 85 | Temp 97.5°F | Ht 62.0 in | Wt 199.0 lb

## 2017-07-10 DIAGNOSIS — H8309 Labyrinthitis, unspecified ear: Secondary | ICD-10-CM | POA: Diagnosis not present

## 2017-07-10 DIAGNOSIS — H65112 Acute and subacute allergic otitis media (mucoid) (sanguinous) (serous), left ear: Secondary | ICD-10-CM

## 2017-07-10 MED ORDER — AZITHROMYCIN 250 MG PO TABS: ORAL_TABLET | ORAL | 0 refills | Status: DC

## 2017-07-10 MED ORDER — MECLIZINE HCL 12.5 MG PO TABS: 12.5000 mg | ORAL_TABLET | Freq: Three times a day (TID) | ORAL | 0 refills | Status: DC | PRN

## 2017-07-10 NOTE — Progress Notes (Signed)
  Subjective:   Patient ID: Kayla Sanchez, female    DOB: 05-16-1996, 21 y.o.   MRN: [id number] CC: Dizziness (Constant)  HPI: Kayla Sanchez is a 21 y.o. female presenting for Dizziness (Constant)  Started about 3 days ago Worse when she moves her head a lot Feels like her vision comes and goes Has an appointment to see her eye doctor tomorrow Left ear has been hurting, feels pain above the ear Slight dull aching in her left left temple today Taking some Tylenol  Minimal sore throat, cough, runny nose  No nausea, no vomiting  Relevant past medical, surgical, family and social history reviewed. Allergies and medications reviewed and updated. Social History   Tobacco Use  Smoking Status Former Smoker  . Packs/day: 0.50  . Years: 4.00  . Pack years: 2.00  . Types: Cigarettes  . Last attempt to quit: 06/29/2016  . Years since quitting: 1.0  Smokeless Tobacco Never Used   ROS: Per HPI   Objective:    BP 129/75   Pulse 85   Temp (!) 97.5 F (36.4 C) (Oral)   Ht 5\' 2"  (1.575 m)   Wt 199 lb (90.3 kg)   BMI 36.40 kg/m   Wt Readings from Last 3 Encounters:  07/10/17 199 lb (90.3 kg)  07/30/16 165 lb (74.8 kg)  04/30/16 172 lb (78 kg)    Gen: NAD, alert, cooperative with exam, NCAT EYES: EOMI, no conjunctival injection, or no icterus, a couple weeks nystagmus with both rightward and leftward gaze ENT: Left TM red, injected with effusion, right TM normal, OP with erythema, no tonsillar exudates LYMPH: no cervical LAD CV: NRRR, normal S1/S2, no murmur, distal pulses 2+ b/l Resp: CTABL, no wheezes, normal WOB Abd: +BS, soft, NTND. no guarding or organomegaly Ext: No edema, warm Neuro: Alert and oriented, strength equal b/l UE and LE, coordination grossly normal MSK: normal muscle bulk  Assessment & Plan:  Kayla Sanchez was seen today for dizziness.  Diagnoses and all orders for this visit:  Acute mucoid otitis media of left ear -     azithromycin  (ZITHROMAX) 250 MG tablet; Take 2 the first day and then one each day after.  Labyrinthitis, unspecified laterality If not improving within the next few days needs to be seen Return precautions discussed Trial of below Has appointment with eye doctor tomorrow for vision changes -     meclizine (ANTIVERT) 12.5 MG tablet; Take 1 tablet (12.5 mg total) by mouth 3 (three) times daily as needed for dizziness.   Follow up plan: As needed Rex Krasarol Lewellyn Fultz, MD [facility]

## 2017-07-18 ENCOUNTER — Ambulatory Visit (INDEPENDENT_AMBULATORY_CARE_PROVIDER_SITE_OTHER): Payer: BLUE CROSS/BLUE SHIELD | Admitting: Pediatrics

## 2017-07-18 ENCOUNTER — Encounter: Payer: Self-pay | Admitting: Pediatrics

## 2017-07-18 VITALS — BP 102/70 | HR 80 | Temp 98.3°F | Ht 62.0 in | Wt 199.2 lb

## 2017-07-18 DIAGNOSIS — H6983 Other specified disorders of Eustachian tube, bilateral: Secondary | ICD-10-CM

## 2017-07-18 DIAGNOSIS — F410 Panic disorder [episodic paroxysmal anxiety] without agoraphobia: Secondary | ICD-10-CM

## 2017-07-18 DIAGNOSIS — H8110 Benign paroxysmal vertigo, unspecified ear: Secondary | ICD-10-CM

## 2017-07-18 MED ORDER — PROPRANOLOL HCL 20 MG PO TABS: 20.0000 mg | ORAL_TABLET | Freq: Three times a day (TID) | ORAL | 1 refills | Status: DC | PRN

## 2017-07-18 MED ORDER — FLUTICASONE PROPIONATE 50 MCG/ACT NA SUSP: 2.0000 | Freq: Every day | NASAL | 6 refills | Status: DC

## 2017-07-18 NOTE — Patient Instructions (Addendum)
flonase nasal spray, 2 sprays each side once a day  Try meclizine at night to help with vertigo  How to Perform the Epley Maneuver The Epley maneuver is an exercise that relieves symptoms of vertigo. Vertigo is the feeling that you or your surroundings are moving when they are not. When you feel vertigo, you may feel like the room is spinning and have trouble walking. Dizziness is a little different than vertigo. When you are dizzy, you may feel unsteady or light-headed. You can do this maneuver at home whenever you have symptoms of vertigo. You can do it up to 3 times a day until your symptoms go away. Even though the Epley maneuver may relieve your vertigo for a few weeks, it is possible that your symptoms will return. This maneuver relieves vertigo, but it does not relieve dizziness. What are the risks? If it is done correctly, the Epley maneuver is considered safe. Sometimes it can lead to dizziness or nausea that goes away after a short time. If you develop other symptoms, such as changes in vision, weakness, or numbness, stop doing the maneuver and call your health care provider. How to perform the Epley maneuver 1. Sit on the edge of a bed or table with your back straight and your legs extended or hanging over the edge of the bed or table. 2. Turn your head halfway toward the affected ear or side. 3. Lie backward quickly with your head turned until you are lying flat on your back. You may want to position a pillow under your shoulders. 4. Hold this position for 30 seconds. You may experience an attack of vertigo. This is normal. 5. Turn your head to the opposite direction until your unaffected ear is facing the floor. 6. Hold this position for 30 seconds. You may experience an attack of vertigo. This is normal. Hold this position until the vertigo stops. 7. Turn your whole body to the same side as your head. Hold for another 30 seconds. 8. Sit back up. You can repeat this exercise up to 3  times a day. Follow these instructions at home:  After doing the Epley maneuver, you can return to your normal activities.  Ask your health care provider if there is anything you should do at home to prevent vertigo. He or she may recommend that you: ? Keep your head raised (elevated) with two or more pillows while you sleep. ? Do not sleep on the side of your affected ear. ? Get up slowly from bed. ? Avoid sudden movements during the day. ? Avoid extreme head movement, like looking up or bending over. Contact a health care provider if:  Your vertigo gets worse.  You have other symptoms, including: ? Nausea. ? Vomiting. ? Headache. Get help right away if:  You have vision changes.  You have a severe or worsening headache or neck pain.  You cannot stop vomiting.  You have new numbness or weakness in any part of your body. Summary  Vertigo is the feeling that you or your surroundings are moving when they are not.  The Epley maneuver is an exercise that relieves symptoms of vertigo.  If the Epley maneuver is done correctly, it is considered safe. You can do it up to 3 times a day. This information is not intended to replace advice given to you by your health care provider. Make sure you discuss any questions you have with your health care provider. Document Released: 07/21/2013 Document Revised: 06/05/2016 Document Reviewed:  06/05/2016 Elsevier Interactive Patient Education  2017 [employer]

## 2017-07-18 NOTE — Progress Notes (Signed)
  Subjective:   Patient ID: Kayla FraiseElizabeth D Zylstra, female    DOB: [birth year], 21 y.o.   MRN: [id number] CC: Follow-up (Dizziness)  HPI: Kayla Fraiselizabeth D Rease is a 21 y.o. female presenting for Follow-up (Dizziness)  Continued symptoms as of 3 days ago, to call to make this appointment In the last 2 days the dizziness has been much improved She finished the medication for acute otitis media Still feels somewhat stuffy Vertigo only comes on episodically, when she turns her head quickly or bends over and up At night when she lies down she notices it as well  Continues to have some panic attacks off and on Lexapro has helped a lot with her anxiety She has a 6455-month-old at home, is not breast-feeding right now  Relevant past medical, surgical, family and social history reviewed. Allergies and medications reviewed and updated. Social History   Tobacco Use  Smoking Status Former Smoker  . Packs/day: 0.50  . Years: 4.00  . Pack years: 2.00  . Types: Cigarettes  . Last attempt to quit: 06/29/2016  . Years since quitting: 1.0  Smokeless Tobacco Never Used   ROS: Per HPI   Objective:    BP 102/70   Pulse 80   Temp 98.3 F (36.8 C) (Oral)   Ht 5\' 2"  (1.575 m)   Wt 199 lb 3.2 oz (90.4 kg)   BMI 36.43 kg/m   Wt Readings from Last 3 Encounters:  07/18/17 199 lb 3.2 oz (90.4 kg)  07/10/17 199 lb (90.3 kg)  07/30/16 165 lb (74.8 kg)    Gen: NAD, alert, cooperative with exam, NCAT EYES: EOMI, no conjunctival injection, or no icterus ENT:  TMs pearly gray and retracted b/l, OP with mild erythema LYMPH: no cervical LAD CV: NRRR, normal S1/S2, no murmur, distal pulses 2+ b/l Resp: CTABL, no wheezes, normal WOB Abd: +BS, soft, NTND. no guarding or organomegaly Ext: No edema, warm Neuro: Alert and oriented, strength equal b/l UE and LE, coordination grossly normal MSK: normal muscle bulk  Assessment & Plan:  Lanora Manislizabeth was seen today for follow-up multiple med problems  Diagnoses  and all orders for this visit:  Benign paroxysmal positional vertigo, unspecified laterality Improving, can try Epley maneuver at home Meclizine at night if needed  Panic attacks On SSRI, continue counseling Trial below, goal to avoid benzodiazepine -     propranolol (INDERAL) 20 MG tablet; Take 1 tablet (20 mg total) by mouth 3 (three) times daily as needed.  Eustachian tube dysfunction, bilateral Start below, recent URI -     fluticasone (FLONASE) 50 MCG/ACT nasal spray; Place 2 sprays into both nostrils daily.   Follow up plan: Return in about 3 months (around 10/16/2017), or if symptoms worsen or fail to improve. Rex Krasarol Iretta Mangrum, MD [facility]

## 2017-07-19 ENCOUNTER — Encounter (HOSPITAL_COMMUNITY): Payer: Self-pay | Admitting: Psychiatry

## 2017-07-19 ENCOUNTER — Ambulatory Visit (INDEPENDENT_AMBULATORY_CARE_PROVIDER_SITE_OTHER): Payer: BLUE CROSS/BLUE SHIELD | Admitting: Psychiatry

## 2017-07-19 DIAGNOSIS — Z87891 Personal history of nicotine dependence: Secondary | ICD-10-CM | POA: Diagnosis not present

## 2017-07-19 DIAGNOSIS — Z813 Family history of other psychoactive substance abuse and dependence: Secondary | ICD-10-CM

## 2017-07-19 DIAGNOSIS — Z811 Family history of alcohol abuse and dependence: Secondary | ICD-10-CM

## 2017-07-19 DIAGNOSIS — Z818 Family history of other mental and behavioral disorders: Secondary | ICD-10-CM

## 2017-07-19 DIAGNOSIS — Z79899 Other long term (current) drug therapy: Secondary | ICD-10-CM | POA: Diagnosis not present

## 2017-07-19 DIAGNOSIS — F410 Panic disorder [episodic paroxysmal anxiety] without agoraphobia: Secondary | ICD-10-CM | POA: Diagnosis not present

## 2017-07-19 MED ORDER — ALPRAZOLAM 0.25 MG PO TABS: 0.2500 mg | ORAL_TABLET | Freq: Two times a day (BID) | ORAL | 2 refills | Status: DC | PRN

## 2017-07-19 MED ORDER — ESCITALOPRAM OXALATE 20 MG PO TABS: 20.0000 mg | ORAL_TABLET | Freq: Every day | ORAL | 2 refills | Status: DC

## 2017-07-19 NOTE — Progress Notes (Signed)
BH MD/PA/NP OP Progress Note  07/19/2017 9:57 AM Kayla Sanchez  MRN:  [id number]  Chief Complaint:  Chief Complaint    Anxiety; Follow-up     HPI:   This patient is a 21 year old single white female who lives with her boyfriend and 2- children in [place]. She is a Aeronautical engineer at [employer].  The patient was referred by Bennie Pierini, nurse practitioner at [facility] family medicine for further treatment and assessment of anxiety panic attacks and depression.  The patient states that she first got treated for depression when she was approximately 15. She was anxious sad sleeping too much and didn't want to go to school. She can't remember any specific triggers for these symptoms. She was put on Celexa through primary care but this did not work so well and then was placed on Zoloft for approximately 2 years which seemed to help. She then went to Lexapro. She wasn't taking it consistently and it was changed to Prozac in August. 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  During the switch between medicines she developed panic attacks which have escalated. She has had them every day now for several weeks, sometimes several times a day. During these she feels shaky with chest pain back pain tachycardia vertigo tremor light sensitivity. She even went to the emergency room once but nothing specific was found and she was given a low dose of Xanax which has helped. She doesn't think the Prozac but she is taking now is working for her. She thinks perhaps a Lexapro worked better. She denies being significantly depressed. Her sleep is okay but she often wakes up.  The patient does state she has several stressors in her life. She is very hard on herself in terms of her work International aid/development worker. 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 She is responsible for 30 other  employees and feels overwhelmed by this at times. She and her boyfriend have had struggles and she's not sure she wants to stay in the relationship and has even tried dating other people which made him angry. They have a child together so she is trying to work this out. She denies suicidal ideation but her energy is a bit low and she doesn't get any exercise. Sometimes she wishes she could do something else with her life but has no time given her ride to work as well as the long hours. She's never had any previous psychiatric treatment or counseling  Patient returns after 3 months.  Her baby is now 37 months old and doing well.  She herself recently had an ear infection and is now having vertigo.  Meclizine did not help.  It seems to be making her a bit more anxious.  Her primary doctor prescribed Inderal but I am not sure if this will help but the Xanax low dose does help so she needs to continue it.  She has been more fatigued lately but it she is trying to manage 2 young children a job and being sick herself.  She has a history of anemia and I urged her to get this checked out.  Her mood is good and she and her boyfriend are getting along well.  She states that in the evening she gets anxious and this is where the Xanax may be helpful.  The Lexapro has helped  her mood Visit Diagnosis:    ICD-10-CM   1. Panic disorder F41.0 escitalopram (LEXAPRO) 20 MG tablet    Past Psychiatric History: none  Past Medical History:  Past Medical History:  Diagnosis Date  . Anxiety   . Depression     Past Surgical History:  Procedure Laterality Date  . WISDOM TOOTH EXTRACTION      Family Psychiatric History: See below  Family History:  Family History  Problem Relation Age of Onset  . Depression Mother   . Anxiety disorder Mother   . Drug abuse Father   . Anxiety disorder Maternal Aunt   . Alcohol abuse Maternal Grandfather   . Anxiety disorder Maternal Grandfather     Social History:  Social  History   Socioeconomic History  . Marital status: Single    Spouse name: None  . Number of children: None  . Years of education: None  . Highest education level: None  Social Needs  . Financial resource strain: None  . Food insecurity - worry: None  . Food insecurity - inability: None  . Transportation needs - medical: None  . Transportation needs - non-medical: None  Occupational History  . None  Tobacco Use  . Smoking status: Former Smoker    Packs/day: 0.50    Years: 4.00    Pack years: 2.00    Types: Cigarettes    Last attempt to quit: 06/29/2016    Years since quitting: 1.0  . Smokeless tobacco: Never Used  Substance and Sexual Activity  . Alcohol use: Yes    Comment: 3 beers 1 x per week  . Drug use: No    Comment: 05-21-2016 per pt 2 yrs ago  . Sexual activity: Yes    Birth control/protection: Condom  Other Topics Concern  . None  Social History Narrative  . None    Allergies:  Allergies  Allergen Reactions  . Ciprofloxacin Other (See Comments)    Tendonitis   . Penicillins Hives    Has patient had a PCN reaction causing immediate rash, facial/tongue/throat swelling, SOB or lightheadedness with hypotension: No Has patient had a PCN reaction causing severe rash involving mucus membranes or skin necrosis: No Has patient had a PCN reaction that required hospitalization No Has patient had a PCN reaction occurring within the last 10 years: No If all of the above answers are "NO", then may proceed with Cephalosporin use.     Metabolic Disorder Labs: No results found for: HGBA1C, MPG No results found for: PROLACTIN No results found for: CHOL, TRIG, HDL, CHOLHDL, VLDL, LDLCALC Lab Results  Component Value Date   TSH 0.635 07/31/2016    Therapeutic Level Labs: No results found for: LITHIUM No results found for: VALPROATE No components found for:  CBMZ  Current Medications: Current Outpatient Medications  Medication Sig Dispense Refill  . ALPRAZolam  (XANAX) 0.25 MG tablet Take 1 tablet (0.25 mg total) by mouth 2 (two) times daily as needed for anxiety (Use one tablet as needed for severe anxiety.). 60 tablet 2  . escitalopram (LEXAPRO) 20 MG tablet Take 1 tablet (20 mg total) by mouth daily. 90 tablet 2  . fluticasone (FLONASE) 50 MCG/ACT nasal spray Place 2 sprays into both nostrils daily. 16 g 6  . meclizine (ANTIVERT) 12.5 MG tablet Take 1 tablet (12.5 mg total) by mouth 3 (three) times daily as needed for dizziness. 30 tablet 0  . propranolol (INDERAL) 20 MG tablet Take 1 tablet (20 mg total) by mouth 3 (three)  times daily as needed. 30 tablet 1   No current facility-administered medications for this visit.      Musculoskeletal: Strength & Muscle Tone: within normal limits Gait & Station: normal Patient leans: N/A  Psychiatric Specialty Exam: Review of Systems  Constitutional: Positive for malaise/fatigue.  Neurological: Positive for dizziness.  All other systems reviewed and are negative.   Blood pressure 112/76, pulse 78, height 5\' 2"  (1.575 m), weight 197 lb (89.4 kg), SpO2 97 %.Body mass index is 36.03 kg/m.  General Appearance: Casual, Neat and Well Groomed  Eye Contact:  Good  Speech:  Clear and Coherent  Volume:  Normal  Mood:  Anxious  Affect:  Congruent  Thought Process:  Goal Directed  Orientation:  Full (Time, Place, and Person)  Thought Content: WDL   Suicidal Thoughts:  No  Homicidal Thoughts:  No  Memory:  Immediate;   Good Recent;   Good Remote;   Fair  Judgement:  Fair  Insight:  Fair  Psychomotor Activity:  Normal  Concentration:  Concentration: Good and Attention Span: Good  Recall:  Good  Fund of Knowledge: Good  Language: Good  Akathisia:  No  Handed:  Right  AIMS (if indicated): not done  Assets:  Communication Skills Desire for Improvement Leisure Time Physical Health Resilience Social Support Talents/Skills  ADL's:  Intact  Cognition: WNL  Sleep:  Good   Screenings: GAD-7      [facility] Visit from [facility]/19/2016 in [facility] Office Visit from 02/28/2016 in [facility]  Total GAD-7 Score  20  19    PHQ2-9     [facility] Visit [facility] 07/18/2017 in [facility] Office Visit from 07/10/2017 in [facility] Office Visit from 04/19/2016 in [facility] Office Visit from 03/07/2016 in [facility] Office Visit from 02/28/2016 in [facility]  PHQ-2 Total Score  0  0  4  3  2   PHQ-9 Total Score  No data  No data  15  12  8        Assessment and Plan: This patient is a 21 year old female with a history of primarily anxiety.  Her anxiety is a bit worse now that she has so many responsibilities.  For the most part she is handling it well.  She will continue Lexapro 20 mg daily for anxiety and also Xanax 0.25 mg twice daily as needed for breakthrough anxiety.  She will return to see me in 3 months   Diannia Rudereborah Ross, MD 07/19/2017, 9:57 AM

## 2017-08-01 DIAGNOSIS — Z30431 Encounter for routine checking of intrauterine contraceptive device: Secondary | ICD-10-CM | POA: Diagnosis not present

## 2017-08-01 DIAGNOSIS — Z6837 Body mass index (BMI) 37.0-37.9, adult: Secondary | ICD-10-CM | POA: Diagnosis not present

## 2017-10-02 ENCOUNTER — Ambulatory Visit (HOSPITAL_COMMUNITY): Payer: BLUE CROSS/BLUE SHIELD | Admitting: Psychiatry

## 2017-10-02 ENCOUNTER — Encounter (HOSPITAL_COMMUNITY): Payer: Self-pay | Admitting: Psychiatry

## 2017-10-02 DIAGNOSIS — Z811 Family history of alcohol abuse and dependence: Secondary | ICD-10-CM

## 2017-10-02 DIAGNOSIS — Z813 Family history of other psychoactive substance abuse and dependence: Secondary | ICD-10-CM | POA: Diagnosis not present

## 2017-10-02 DIAGNOSIS — Z87891 Personal history of nicotine dependence: Secondary | ICD-10-CM

## 2017-10-02 DIAGNOSIS — F419 Anxiety disorder, unspecified: Secondary | ICD-10-CM | POA: Diagnosis not present

## 2017-10-02 DIAGNOSIS — R450 Nervousness: Secondary | ICD-10-CM

## 2017-10-02 DIAGNOSIS — F410 Panic disorder [episodic paroxysmal anxiety] without agoraphobia: Secondary | ICD-10-CM | POA: Diagnosis not present

## 2017-10-02 DIAGNOSIS — Z818 Family history of other mental and behavioral disorders: Secondary | ICD-10-CM | POA: Diagnosis not present

## 2017-10-02 MED ORDER — ALPRAZOLAM 0.5 MG PO TABS: 0.5000 mg | ORAL_TABLET | Freq: Three times a day (TID) | ORAL | 2 refills | Status: DC | PRN

## 2017-10-02 MED ORDER — ESCITALOPRAM OXALATE 20 MG PO TABS: 20.0000 mg | ORAL_TABLET | Freq: Every day | ORAL | 2 refills | Status: DC

## 2017-10-02 NOTE — Progress Notes (Signed)
BH MD/PA/NP OP Progress Note  10/02/2017 2:14 PM Kayla Sanchez  MRN:  [id number]  Chief Complaint:  Chief Complaint    Depression; Anxiety; Follow-up     HPI: This patient is a 22 year old single white female who lives [place] her boyfriend and [place]-childrenin [place]. 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  The patient was referred by [name], nurse practitioner at [facility] for further treatment and assessment of anxiety panic attacks and depression.  The patient states that she first got treated for depression when she was approximately 15. She was anxious sad sleeping too much and didn't want to go to school. She can't remember any specific triggers for these symptoms. She was put on Celexa through primary care but this did not work so well and then was placed on Zoloft for approximately 2 years which seemed to help. She then went to Lexapro. She wasn't taking it consistently and it was changed to Prozac in August. 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  During the switch between medicines she developed panic attacks which have escalated. She has had them every day now for several weeks, sometimes several times a day. During these she feels shaky with chest pain back pain tachycardia vertigo tremor light sensitivity. She even went to the emergency room once but nothing specific was found and she was given a low dose of Xanax which has helped. She doesn't think the Prozac but she is taking now is working for her. She thinks perhaps a Lexapro worked better. She denies being significantly depressed. Her sleep is okay but she often wakes up.  The patient does state she has several stressors in her life. She is very hard on herself in terms of her work International aid/development workerperformance. 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 She is responsible for  30 other employees and feels overwhelmed by this at times. She and her boyfriend have had struggles and she's not sure she wants to stay in the relationship and has even tried dating other people which made him angry. They have a child together so she is trying to work this out. She denies suicidal ideation but her energy is a bit low and she doesn't get any exercise. Sometimes she wishes she could do something else with her life but has no time given her ride to work  Returns after 3 months.  She states that 2 months ago her boyfriend moved out on his own.  They were fighting too much and he did not trust her.  They now have a shared arrangement with the children.  He gets them 3 days a week and she gets them the other 4 days a week.  This is been difficult for her and she really misses them when they are gone.  She has been having a lot of anxiety and panic attacks.  She still able to do her job and she is sleeping well but the anxiety has become overwhelming at times.  The Xanax 0.25 mg not doing much for her so I told her we could increase it.  She is still taking the Lexapro.  She also agrees to come back here for counseling Visit Diagnosis:    ICD-10-CM   1. Panic disorder F41.0 escitalopram (LEXAPRO) 20 MG tablet    Past Psychiatric History: none  Past  Medical History:  Past Medical History:  Diagnosis Date  . Anxiety   . Depression     Past Surgical History:  Procedure Laterality Date  . WISDOM TOOTH EXTRACTION      Family Psychiatric History: See below  Family History:  Family History  Problem Relation Age of Onset  . Depression Mother   . Anxiety disorder Mother   . Drug abuse Father   . Anxiety disorder Maternal Aunt   . Alcohol abuse Maternal Grandfather   . Anxiety disorder Maternal Grandfather     Social History:  Social History   Socioeconomic History  . Marital status: Single    Spouse name: None  . Number of children: None  . Years of education: None  .  Highest education level: None  Social Needs  . Financial resource strain: None  . Food insecurity - worry: None  . Food insecurity - inability: None  . Transportation needs - medical: None  . Transportation needs - non-medical: None  Occupational History  . None  Tobacco Use  . Smoking status: Former Smoker    Packs/day: 0.50    Years: 4.00    Pack years: 2.00    Types: Cigarettes    Last attempt to quit: 06/29/2016    Years since quitting: 1.2  . Smokeless tobacco: Never Used  Substance and Sexual Activity  . Alcohol use: Yes    Comment: 3 beers 1 x per week  . Drug use: No    Comment: 05-21-2016 per pt 2 yrs ago  . Sexual activity: Yes    Birth control/protection: Condom  Other Topics Concern  . None  Social History Narrative  . None    Allergies:  Allergies  Allergen Reactions  . Ciprofloxacin Other (See Comments)    Tendonitis   . Penicillins Hives    Has patient had a PCN reaction causing immediate rash, facial/tongue/throat swelling, SOB or lightheadedness with hypotension: No Has patient had a PCN reaction causing severe rash involving mucus membranes or skin necrosis: No Has patient had a PCN reaction that required hospitalization No Has patient had a PCN reaction occurring within the last 10 years: No If all of the above answers are "NO", then may proceed with Cephalosporin use.     Metabolic Disorder Labs: No results found for: HGBA1C, MPG No results found for: PROLACTIN No results found for: CHOL, TRIG, HDL, CHOLHDL, VLDL, LDLCALC Lab Results  Component Value Date   TSH 0.635 07/31/2016    Therapeutic Level Labs: No results found for: LITHIUM No results found for: VALPROATE No components found for:  CBMZ  Current Medications: Current Outpatient Medications  Medication Sig Dispense Refill  . escitalopram (LEXAPRO) 20 MG tablet Take 1 tablet (20 mg total) by mouth daily. 90 tablet 2  . ALPRAZolam (XANAX) 0.5 MG tablet Take 1 tablet (0.5 mg total)  by mouth 3 (three) times daily as needed for sleep or anxiety. 90 tablet 2   No current facility-administered medications for this visit.      Musculoskeletal: Strength & Muscle Tone: within normal limits Gait & Station: normal Patient leans: N/A  Psychiatric Specialty Exam: Review of Systems  HENT: Positive for ear pain.   Psychiatric/Behavioral: The patient is nervous/anxious.   All other systems reviewed and are negative.   Blood pressure 117/87, pulse 84, height 5\' 2"  (1.575 m), weight 194 lb (88 kg), SpO2 96 %.Body mass index is 35.48 kg/m.  General Appearance: Casual, Neat and Well Groomed  Eye Contact:  Good  Speech:  Clear and Coherent  Volume:  Normal  Mood:  Anxious  Affect:  Congruent  Thought Process:  Goal Directed  Orientation:  Full (Time, Place, and Person)  Thought Content: Rumination   Suicidal Thoughts:  No  Homicidal Thoughts:  No  Memory:  Immediate;   Good Recent;   Good Remote;   Good  Judgement:  Fair  Insight:  Fair  Psychomotor Activity:  Normal  Concentration:  Concentration: Good and Attention Span: Good  Recall:  Good  Fund of Knowledge: Good  Language: Good  Akathisia:  No  Handed:  Right  AIMS (if indicated): not done  Assets:  Communication Skills Desire for Improvement Physical Health Resilience Social Support Talents/Skills  ADL's:  Intact  Cognition: WNL  Sleep:  Good   Screenings: GAD-7     Office Visit from 04/19/2016 in [facility] Office Visit from 02/28/2016 in [facility]  Total GAD-7 Score  20  19    PHQ2-9     Office Visit from 07/18/2017 in [facility] Office Visit from 07/10/2017 in [facility] Office Visit from 04/19/2016 in [facility] Office Visit from 03/07/2016 in [facility] Office Visit from 02/28/2016 in [facility]  PHQ-2 Total Score  0  0  4  3  2    PHQ-9 Total Score  No data  No data  15  12  8        Assessment and Plan: This patient is a 22 year old female who primarily has had symptoms of anxiety.  This is worsened since her boyfriend left and she does not have her children with her as often.  She is willing to come back here for counseling.  We will also increase Xanax to 0.5 mg 3 times daily as needed.  She will continue Lexapro 20 mg daily for depression and anxiety.  She will return to see me in 6 weeks   [name], MD 10/02/2017, 2:14 PM

## 2017-10-03 ENCOUNTER — Ambulatory Visit (INDEPENDENT_AMBULATORY_CARE_PROVIDER_SITE_OTHER): Payer: BLUE CROSS/BLUE SHIELD | Admitting: Pediatrics

## 2017-10-03 ENCOUNTER — Encounter: Payer: Self-pay | Admitting: Pediatrics

## 2017-10-03 VITALS — BP 132/84 | HR 79 | Temp 97.0°F | Ht 62.0 in | Wt 197.0 lb

## 2017-10-03 DIAGNOSIS — J069 Acute upper respiratory infection, unspecified: Secondary | ICD-10-CM | POA: Diagnosis not present

## 2017-10-03 NOTE — Patient Instructions (Signed)

## 2017-10-03 NOTE — Progress Notes (Signed)
  Subjective:   Patient ID: Kayla Sanchez, female    DOB: 18-Jan-1996, 22 y.o.   MRN: [id number] CC: Facial Pain; Nasal Congestion; and Sore Throat  HPI: Kayla Sanchez is a 22 y.o. female presenting for Facial Pain; Nasal Congestion; and Sore Throat  Sick past 4 days, ears feel stopped up, some runny nose, appetite down some. No fevers. Throat hurts when she coughs, doesn't bother her when she swallows.   Relevant past medical, surgical, family and social history reviewed. Allergies and medications reviewed and updated. Social History   Tobacco Use  Smoking Status Former Smoker  . Packs/day: 0.50  . Years: 4.00  . Pack years: 2.00  . Types: Cigarettes  . Last attempt to quit: 06/29/2016  . Years since quitting: 1.2  Smokeless Tobacco Never Used   ROS: Per HPI   Objective:    BP 132/84   Pulse 79   Temp (!) 97 F (36.1 C) (Oral)   Ht 5\' 2"  (1.575 m)   Wt 197 lb (89.4 kg)   BMI 36.03 kg/m   Wt Readings from Last 3 Encounters:  10/03/17 197 lb (89.4 kg)  07/18/17 199 lb 3.2 oz (90.4 kg)  07/10/17 199 lb (90.3 kg)    Gen: NAD, alert, congested EYES: EOMI, no conjunctival injection, or no icterus ENT:  TMs dull pink b/l, OP with mild erythema LYMPH: no cervical LAD CV: NRRR, normal S1/S2, no murmur, distal pulses 2+ b/l Resp: CTABL, no wheezes, normal WOB Ext: No edema, warm Neuro: Alert and oriented, strength equal b/l UE and LE, coordination grossly normal MSK: normal muscle bulk Skin: no rash  Assessment & Plan:  Kayla Sanchez was seen today for facial pain, nasal congestion and sore throat.  Diagnoses and all orders for this visit:  Acute URI Symptom care and return precautions discussed  Follow up plan: Return if symptoms worsen or fail to improve. Rex Krasarol Betul Brisky, MD [facility]

## 2017-10-23 ENCOUNTER — Ambulatory Visit (HOSPITAL_COMMUNITY): Payer: BLUE CROSS/BLUE SHIELD | Admitting: Psychiatry

## 2017-11-08 ENCOUNTER — Ambulatory Visit (HOSPITAL_COMMUNITY): Payer: Self-pay | Admitting: Psychiatry

## 2017-11-13 ENCOUNTER — Ambulatory Visit (HOSPITAL_COMMUNITY): Payer: BLUE CROSS/BLUE SHIELD | Admitting: Psychiatry

## 2018-01-05 ENCOUNTER — Other Ambulatory Visit (HOSPITAL_COMMUNITY): Payer: Self-pay | Admitting: Psychiatry

## 2018-01-05 DIAGNOSIS — F410 Panic disorder [episodic paroxysmal anxiety] without agoraphobia: Secondary | ICD-10-CM

## 2018-01-08 ENCOUNTER — Encounter (HOSPITAL_COMMUNITY): Payer: Self-pay | Admitting: Psychiatry

## 2018-01-08 ENCOUNTER — Ambulatory Visit (INDEPENDENT_AMBULATORY_CARE_PROVIDER_SITE_OTHER): Payer: BLUE CROSS/BLUE SHIELD | Admitting: Psychiatry

## 2018-01-08 DIAGNOSIS — F410 Panic disorder [episodic paroxysmal anxiety] without agoraphobia: Secondary | ICD-10-CM | POA: Diagnosis not present

## 2018-01-08 DIAGNOSIS — Z818 Family history of other mental and behavioral disorders: Secondary | ICD-10-CM

## 2018-01-08 DIAGNOSIS — Z813 Family history of other psychoactive substance abuse and dependence: Secondary | ICD-10-CM

## 2018-01-08 DIAGNOSIS — Z811 Family history of alcohol abuse and dependence: Secondary | ICD-10-CM | POA: Diagnosis not present

## 2018-01-08 DIAGNOSIS — Z87891 Personal history of nicotine dependence: Secondary | ICD-10-CM

## 2018-01-08 MED ORDER — ALPRAZOLAM 0.5 MG PO TABS: 0.5000 mg | ORAL_TABLET | Freq: Three times a day (TID) | ORAL | 2 refills | Status: DC | PRN

## 2018-01-08 MED ORDER — ESCITALOPRAM OXALATE 20 MG PO TABS: 20.0000 mg | ORAL_TABLET | Freq: Every day | ORAL | 2 refills | Status: DC

## 2018-01-08 NOTE — Progress Notes (Signed)
BH MD/PA/NP OP Progress Note  01/08/2018 8:46 AM Kayla Sanchez  MRN:  [id number]  Chief Complaint:  Chief Complaint    Depression; Anxiety; Follow-up     HPI: This patient is a 22 year old single white female who [place]  [place]in [place]. 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  The patient was referred by [name], nurse practitioner at [facility] for further treatment and assessment of anxiety panic attacks and depression.  The patient states that she first got treated for depression when she was approximately 15. She was anxious sad sleeping too much and didn't want to go to school. She can't remember any specific triggers for these symptoms. She was put on Celexa through primary care but this did not work so well and then was placed on Zoloft for approximately 2 years which seemed to help. She then went to Lexapro. She wasn't taking it consistently and it was changed to Prozac in August. 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  During the switch between medicines she developed panic attacks which have escalated. She has had them every day now for several weeks, sometimes several times a day. During these she feels shaky with chest pain back pain tachycardia vertigo tremor light sensitivity. She even went to the emergency room once but nothing specific was found and she was given a low dose of Xanax which has helped. She doesn't think the Prozac but she is taking now is working for her. She thinks perhaps a Lexapro worked better. She denies being significantly depressed. Her sleep is okay but she often wakes up.  The patient does state she has several stressors in her life. She is very hard on herself in terms of her work International aid/development workerperformance. 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 She is responsible for 30 other  employees and feels overwhelmed by this at times. She and her boyfriend have had struggles and she's not sure she wants to stay in the relationship and has even tried dating other people which made him angry. They have a child together so she is trying to work this out. She denies suicidal ideation but her energy is a bit low and she doesn't get any exercise. Sometimes she wishes she could do something else with her life but has no time given her ride to work  The patient returns after 3 months.  She states that she and her boyfriend are still living separately.  When they live together they [place] and argued all the time.  She found out recently that her boyfriend was seeing someone else and she had a very difficult time with that she was quite depressed and anxious.  They talked about it for long time and they have decided to try to get back together again and work things out.  I told her that we did offer couple counseling here and she will consider it.  She is feeling better about this but still does not like her job and feels overwhelmed with the hours.  She has little time to spend with her family.  She is going to start looking around for something else.  Overall however she thinks the medications of helped her depression and anxiety.  Sometimes she has trouble sleeping and I suggested she take 1 of the Xanax as before bed if  she cannot sleep.  Visit Diagnosis:    ICD-10-CM   1. Panic disorder F41.0 escitalopram (LEXAPRO) 20 MG tablet    Past Psychiatric History: none  Past Medical History:  Past Medical History:  Diagnosis Date  . Anxiety   . Depression     Past Surgical History:  Procedure Laterality Date  . WISDOM TOOTH EXTRACTION      Family Psychiatric History: See below  Family History:  Family History  Problem Relation Age of Onset  . Depression Mother   . Anxiety disorder Mother   . Drug abuse Father   . Anxiety disorder Maternal Aunt   . Alcohol abuse Maternal  Grandfather   . Anxiety disorder Maternal Grandfather     Social History:  Social History   Socioeconomic History  . Marital status: Single    Spouse name: Not on file  . Number of children: Not on file  . Years of education: Not on file  . Highest education level: Not on file  Occupational History  . Not on file  Social Needs  . Financial resource strain: Not on file  . Food insecurity:    Worry: Not on file    Inability: Not on file  . Transportation needs:    Medical: Not on file    Non-medical: Not on file  Tobacco Use  . Smoking status: Former Smoker    Packs/day: 0.50    Years: 4.00    Pack years: 2.00    Types: Cigarettes    Last attempt to quit: 06/29/2016    Years since quitting: 1.5  . Smokeless tobacco: Never Used  Substance and Sexual Activity  . Alcohol use: Yes    Comment: 3 beers 1 x per week  . Drug use: No    Comment: 05-21-2016 per pt 2 yrs ago  . Sexual activity: Yes    Birth control/protection: Condom  Lifestyle  . Physical activity:    Days per week: Not on file    Minutes per session: Not on file  . Stress: Not on file  Relationships  . Social connections:    Talks on phone: Not on file    Gets together: Not on file    Attends religious service: Not on file    Active member of club or organization: Not on file    Attends meetings of clubs or organizations: Not on file    Relationship status: Not on file  Other Topics Concern  . Not on file  Social History Narrative  . Not on file    Allergies:  Allergies  Allergen Reactions  . Ciprofloxacin Other (See Comments)    Tendonitis   . Penicillins Hives    Has patient had a PCN reaction causing immediate rash, facial/tongue/throat swelling, SOB or lightheadedness with hypotension: No Has patient had a PCN reaction causing severe rash involving mucus membranes or skin necrosis: No Has patient had a PCN reaction that required hospitalization No Has patient had a PCN reaction occurring  within the last 10 years: No If all of the above answers are "NO", then may proceed with Cephalosporin use.     Metabolic Disorder Labs: No results found for: HGBA1C, MPG No results found for: PROLACTIN No results found for: CHOL, TRIG, HDL, CHOLHDL, VLDL, LDLCALC Lab Results  Component Value Date   TSH 0.635 07/31/2016    Therapeutic Level Labs: No results found for: LITHIUM No results found for: VALPROATE No components found for:  CBMZ  Current Medications: Current Outpatient  Medications  Medication Sig Dispense Refill  . ALPRAZolam (XANAX) 0.5 MG tablet Take 1 tablet (0.5 mg total) by mouth 3 (three) times daily as needed for sleep or anxiety. 90 tablet 2  . escitalopram (LEXAPRO) 20 MG tablet Take 1 tablet (20 mg total) by mouth daily. 90 tablet 2   No current facility-administered medications for this visit.      Musculoskeletal: Strength & Muscle Tone: within normal limits Gait & Station: normal Patient leans: N/A  Psychiatric Specialty Exam: Review of Systems  All other systems reviewed and are negative.   Blood pressure 137/90, pulse 86, height 5\' 2"  (1.575 m), weight 199 lb (90.3 kg), SpO2 98 %.Body mass index is 36.4 kg/m.  General Appearance: Casual, Neat and Well Groomed  Eye Contact:  Good  Speech:  Clear and Coherent  Volume:  Normal  Mood:  Euthymic  Affect:  Congruent  Thought Process:  Goal Directed  Orientation:  Full (Time, Place, and Person)  Thought Content: Rumination   Suicidal Thoughts:  No  Homicidal Thoughts:  No  Memory:  Immediate;   Good Recent;   Good Remote;   Good  Judgement:  Fair  Insight:  Fair  Psychomotor Activity:  Normal  Concentration:  Concentration: Good and Attention Span: Good  Recall:  Good  Fund of Knowledge: Good  Language: Good  Akathisia:  No  Handed:  Right  AIMS (if indicated): not done  Assets:  Communication Skills Desire for Improvement Physical Health Resilience Social Support Talents/Skills   ADL's:  Intact  Cognition: WNL  Sleep:  Fair   Screenings: GAD-7     Office Visit from 04/19/2016 in [facility] Office Visit from 02/28/2016 in [facility]  Total GAD-7 Score  20  19    PHQ2-9     Office Visit from 10/03/2017 in [facility] Office Visit from 07/18/2017 in [facility] Office Visit from 07/10/2017 in [facility] Office Visit from 04/19/2016 in [facility] Office Visit from 03/07/2016 in [facility]  PHQ-2 Total Score  0  0  0  4  3  PHQ-9 Total Score  -  -  -  15  12       Assessment and Plan: This patient is a 22 year old female with a history of depression and anxiety.  She seems to be doing well on her current regimen.  She will continue Lexapro 20 mg daily for depression and Xanax 0.5 mg 3 times daily as needed.  I suggested she take it at bedtime until she gets her sleep improved.  She is going to consider couples counseling here and let us know.  Otherwise she will return to see me in 3 months   [name], MD 01/08/2018, 8:46 AM

## 2018-02-10 ENCOUNTER — Ambulatory Visit (INDEPENDENT_AMBULATORY_CARE_PROVIDER_SITE_OTHER): Payer: BLUE CROSS/BLUE SHIELD

## 2018-02-10 ENCOUNTER — Encounter: Payer: Self-pay | Admitting: Pediatrics

## 2018-02-10 ENCOUNTER — Ambulatory Visit (INDEPENDENT_AMBULATORY_CARE_PROVIDER_SITE_OTHER): Payer: BLUE CROSS/BLUE SHIELD | Admitting: Pediatrics

## 2018-02-10 VITALS — BP 124/80 | HR 83 | Temp 98.3°F | Ht 62.0 in | Wt 202.0 lb

## 2018-02-10 DIAGNOSIS — Z720 Tobacco use: Secondary | ICD-10-CM

## 2018-02-10 DIAGNOSIS — Z789 Other specified health status: Secondary | ICD-10-CM

## 2018-02-10 DIAGNOSIS — Z7289 Other problems related to lifestyle: Secondary | ICD-10-CM

## 2018-02-10 DIAGNOSIS — R109 Unspecified abdominal pain: Secondary | ICD-10-CM

## 2018-02-10 DIAGNOSIS — N200 Calculus of kidney: Secondary | ICD-10-CM | POA: Diagnosis not present

## 2018-02-10 DIAGNOSIS — K219 Gastro-esophageal reflux disease without esophagitis: Secondary | ICD-10-CM | POA: Diagnosis not present

## 2018-02-10 LAB — MICROSCOPIC EXAMINATION
Bacteria, UA: NONE SEEN
RBC MICROSCOPIC, UA: NONE SEEN /HPF (ref 0–2)
Renal Epithel, UA: NONE SEEN /hpf

## 2018-02-10 LAB — URINALYSIS, COMPLETE
Bilirubin, UA: NEGATIVE
Glucose, UA: NEGATIVE
LEUKOCYTES UA: NEGATIVE
Nitrite, UA: NEGATIVE
PH UA: 7 (ref 5.0–7.5)
RBC, UA: NEGATIVE
Specific Gravity, UA: 1.02 (ref 1.005–1.030)
UUROB: 1 mg/dL (ref 0.2–1.0)

## 2018-02-10 MED ORDER — FAMOTIDINE 20 MG PO TABS: 20.0000 mg | ORAL_TABLET | Freq: Two times a day (BID) | ORAL | 1 refills | Status: DC

## 2018-02-10 NOTE — Progress Notes (Signed)
  Subjective:   Patient ID: Kayla FraiseElizabeth D Najarian, female    DOB: 1996/02/01, 22 y.o.   MRN: [id number] CC: Chest Pain (left lower, back ) and Gastroesophageal Reflux  HPI: Kayla Fraiselizabeth D Leveque is a 22 y.o. female   Left flank pain: Started about 3 days ago.  No fevers.  No dysuria.  Pain today has moved more into her left upper quadrant. Last kidney stone 2 years ago.  Pain comes on randomly, not associate with activity.  Last about 5 minutes and gets better.  Nothing helps with the pain when it comes on.  Epigastric pain: Started within the last week.  Usually happens after eating.  Spicy foods make it worse.  She drinks 4-6 beers 5 to 7 days a week.  Tobacco use: Smoking less than a pack to a pack and half a day.  Does not smoke in the home.  Recently started smoking in her car.   Has an IUD.   Relevant past medical, surgical, family and social history reviewed. Allergies and medications reviewed and updated. Social History   Tobacco Use  Smoking Status Former Smoker  . Packs/day: 0.50  . Years: 4.00  . Pack years: 2.00  . Types: Cigarettes  . Last attempt to quit: 06/29/2016  . Years since quitting: 1.6  Smokeless Tobacco Never Used   ROS: Per HPI   Objective:    BP 124/80   Pulse 83   Temp 98.3 F (36.8 C) (Oral)   Ht 5\' 2"  (1.575 m)   Wt 202 lb (91.6 kg)   BMI 36.95 kg/m   Wt Readings from Last 3 Encounters:  02/10/18 202 lb (91.6 kg)  10/03/17 197 lb (89.4 kg)  07/18/17 199 lb 3.2 oz (90.4 kg)    Gen: NAD, alert, cooperative with exam, NCAT EYES: EOMI, no conjunctival injection, or no icterus ENT:  TMs pearly gray b/l, OP without erythema LYMPH: no cervical LAD CV: NRRR, normal S1/S2, no murmur, distal pulses 2+ b/l Resp: CTABL, no wheezes, normal WOB Abd: +BS, soft, NTND. no guarding or organomegaly, no CVA tenderness Ext: No edema, warm Neuro: Alert and oriented  Assessment & Plan:  Lanora Manislizabeth was seen today for chest pain and gastroesophageal  reflux.  Diagnoses and all orders for this visit:  Gastroesophageal reflux disease, esophagitis presence not specified Avoid exacerbating foods, decrease etoh intake, working on smoking cessation -     famotidine (PEPCID) 20 MG tablet; Take 1 tablet (20 mg total) by mouth 2 (two) times daily.  Tobacco use Cessation strategies discussed.  Declined from echo intervention at this time.  Flank pain History of kidney stones.  Will get below. -     DG Abd 1 View; Future -     Urinalysis, Complete -     Urine Culture  Alcohol use Encouraged cutting back.  Follow up plan: Return in about 2 months (around 04/13/2018). Rex Krasarol Vincent, MD [facility]

## 2018-02-10 NOTE — Patient Instructions (Addendum)
1-800-QUIT-NOW for help with tobacco use   Food Choices for Gastroesophageal Reflux Disease, Adult When you have gastroesophageal reflux disease (GERD), the foods you eat and your eating habits are very important. Choosing the right foods can help ease your discomfort. What guidelines do I need to follow?  Choose fruits, vegetables, whole grains, and low-fat dairy products.  Choose low-fat meat, fish, and poultry.  Limit fats such as oils, salad dressings, butter, nuts, and avocado.  Keep a food diary. This helps you identify foods that cause symptoms.  Avoid foods that cause symptoms. These may be different for everyone.  Eat small meals often instead of 3 large meals a day.  Eat your meals slowly, in a place where you are relaxed.  Limit fried foods.  Cook foods using methods other than frying.  Avoid drinking alcohol.  Avoid drinking large amounts of liquids with your meals.  Avoid bending over or lying down until 2-3 hours after eating. What foods are not recommended? These are some foods and drinks that may make your symptoms worse: Vegetables Tomatoes. Tomato juice. Tomato and spaghetti sauce. Chili peppers. Onion and garlic. Horseradish. Fruits Oranges, grapefruit, and lemon (fruit and juice). Meats High-fat meats, fish, and poultry. This includes hot dogs, ribs, ham, sausage, salami, and bacon. Dairy Whole milk and chocolate milk. Sour cream. Cream. Butter. Ice cream. Cream cheese. Drinks Coffee and tea. Bubbly (carbonated) drinks or energy drinks. Condiments Hot sauce. Barbecue sauce. Sweets/Desserts Chocolate and cocoa. Donuts. Peppermint and spearmint. Fats and Oils High-fat foods. This includes JamaicaFrench fries and potato chips. Other Vinegar. Strong spices. This includes black pepper, white pepper, red pepper, cayenne, curry powder, cloves, ginger, and chili powder. The items listed above may not be a complete list of foods and drinks to avoid. Contact your  dietitian for more information. This information is not intended to replace advice given to you by your health care provider. Make sure you discuss any questions you have with your health care provider. Document Released: 01/15/2012 Document Revised: 12/22/2015 Document Reviewed: 05/20/2013 Elsevier Interactive Patient Education  2017 [employer]

## 2018-02-11 LAB — URINE CULTURE

## 2018-02-13 ENCOUNTER — Telehealth: Payer: Self-pay | Admitting: Pediatrics

## 2018-02-14 NOTE — Telephone Encounter (Signed)
See result note, will close encounter. 

## 2018-04-10 ENCOUNTER — Ambulatory Visit (HOSPITAL_COMMUNITY): Payer: Self-pay | Admitting: Psychiatry

## 2018-04-16 ENCOUNTER — Ambulatory Visit: Payer: BLUE CROSS/BLUE SHIELD | Admitting: Pediatrics

## 2018-04-23 ENCOUNTER — Ambulatory Visit (HOSPITAL_COMMUNITY): Payer: Self-pay | Admitting: Psychiatry

## 2018-04-24 ENCOUNTER — Encounter: Payer: Self-pay | Admitting: Pediatrics

## 2018-05-01 ENCOUNTER — Encounter: Payer: Self-pay | Admitting: Family

## 2018-05-01 ENCOUNTER — Ambulatory Visit (INDEPENDENT_AMBULATORY_CARE_PROVIDER_SITE_OTHER): Payer: BLUE CROSS/BLUE SHIELD | Admitting: Family

## 2018-05-01 VITALS — BP 133/78 | HR 91 | Temp 97.8°F | Ht 62.0 in | Wt 205.6 lb

## 2018-05-01 DIAGNOSIS — S161XXA Strain of muscle, fascia and tendon at neck level, initial encounter: Secondary | ICD-10-CM

## 2018-05-01 DIAGNOSIS — Z01419 Encounter for gynecological examination (general) (routine) without abnormal findings: Secondary | ICD-10-CM | POA: Diagnosis not present

## 2018-05-01 DIAGNOSIS — Z6837 Body mass index (BMI) 37.0-37.9, adult: Secondary | ICD-10-CM | POA: Diagnosis not present

## 2018-05-01 LAB — HM PAP SMEAR: HM Pap smear: NEGATIVE

## 2018-05-01 MED ORDER — NAPROXEN 500 MG PO TABS: 500.0000 mg | ORAL_TABLET | Freq: Two times a day (BID) | ORAL | 1 refills | Status: DC

## 2018-05-01 MED ORDER — PREDNISONE 10 MG (21) PO TBPK: ORAL_TABLET | ORAL | 0 refills | Status: DC

## 2018-05-01 NOTE — Progress Notes (Signed)
   Subjective:    Patient ID: Kayla Sanchez, female    DOB: 1995/11/07, 22 y.o.   MRN: [id number]   Chief Complaint  Patient presents with  . Neck Pain    right sided neck pain, last couple of days this morning going up back of head    Neck Pain   This is a new problem. The current episode started in the past 7 days. The problem occurs intermittently. The problem has been waxing and waning. The pain is associated with an unknown factor. The pain is present in the right side. The quality of the pain is described as aching. The pain is at a severity of 7/10. The pain is moderate. The symptoms are aggravated by twisting and position. Pertinent negatives include no headaches, leg pain, pain with swallowing, paresis, photophobia, syncope, trouble swallowing or visual change. Associated symptoms comments: Dizziness when moving head . She has tried NSAIDs for the symptoms. The treatment provided mild relief.      Review of Systems  HENT: Negative for trouble swallowing.   Eyes: Negative for photophobia.  Cardiovascular: Negative for syncope.  Musculoskeletal: Positive for neck pain.  Neurological: Negative for headaches.  All other systems reviewed and are negative.      Objective:   Physical Exam  Constitutional: She is oriented to person, place, and time. She appears well-developed and well-nourished. No distress.  HENT:  Head: Normocephalic.  Eyes: Pupils are equal, round, and reactive to light.  Neck: Normal range of motion. Neck supple. No thyromegaly present.  Cardiovascular: Normal rate, regular rhythm, normal heart sounds and intact distal pulses.  No murmur heard. Pulmonary/Chest: Effort normal and breath sounds normal. No respiratory distress. She has no wheezes.  Abdominal: Soft. Bowel sounds are normal. She exhibits no distension. There is no tenderness.  Musculoskeletal: She exhibits tenderness. She exhibits no edema.  Pain in right posterior neck with rotation and  flexion  Neurological: She is alert and oriented to person, place, and time. She has normal reflexes. No cranial nerve deficit.  Skin: Skin is warm and dry.  Psychiatric: She has a normal mood and affect. Her behavior is normal. Judgment and thought content normal.  Vitals reviewed.     BP 133/78   Pulse 91   Temp 97.8 F (36.6 C) (Oral)   Ht 5\' 2"  (1.575 m)   Wt 205 lb 9.6 oz (93.3 kg)   BMI 37.60 kg/m     Assessment & Plan:  Kayla Sanchez comes in today with chief complaint of Neck Pain (right sided neck pain, last couple of days this morning going up back of head)   Diagnosis and orders addressed:  1. Strain of neck muscle, initial encounter Rest Ice ROM exercises RTO if symptoms worsen or do not improve - predniSONE (STERAPRED UNI-PAK 21 TAB) 10 MG (21) TBPK tablet; Use as directed  Dispense: 21 tablet; Refill: 0 - naproxen (NAPROSYN) 500 MG tablet; Take 1 tablet (500 mg total) by mouth 2 (two) times daily with a meal.  Dispense: 60 tablet; Refill: 1  Jannifer Rodney, FNP

## 2018-05-01 NOTE — Patient Instructions (Signed)

## 2018-10-30 ENCOUNTER — Ambulatory Visit (INDEPENDENT_AMBULATORY_CARE_PROVIDER_SITE_OTHER): Payer: BLUE CROSS/BLUE SHIELD | Admitting: Family Medicine

## 2018-10-30 ENCOUNTER — Telehealth: Payer: Self-pay | Admitting: Pediatrics

## 2018-10-30 DIAGNOSIS — J301 Allergic rhinitis due to pollen: Secondary | ICD-10-CM

## 2018-10-30 DIAGNOSIS — Z7189 Other specified counseling: Secondary | ICD-10-CM

## 2018-10-30 DIAGNOSIS — J040 Acute laryngitis: Secondary | ICD-10-CM | POA: Diagnosis not present

## 2018-10-30 MED ORDER — CETIRIZINE HCL 10 MG PO TABS: 10.0000 mg | ORAL_TABLET | Freq: Every day | ORAL | 11 refills | Status: DC

## 2018-10-30 MED ORDER — PREDNISONE 10 MG (21) PO TBPK: ORAL_TABLET | ORAL | 0 refills | Status: DC

## 2018-10-30 MED ORDER — FLUTICASONE PROPIONATE 50 MCG/ACT NA SUSP: 2.0000 | Freq: Every day | NASAL | 6 refills | Status: DC

## 2018-10-30 NOTE — Progress Notes (Signed)
Telephone visit  Subjective: CC: URI PCP: Johna Sheriff, MD (Inactive) GTX:[id number] Kayla Sanchez is a 23 y.o. female calls for telephone consult today. Patient provides verbal consent for consult held via phone.  Location of patient: home Location of provider: [facility] Others present for call: none  1. Upper respiratory symptoms Patient reports a 5-day history of postnasal drip, rhinorrhea, sore throat, loss of voice.  She reports this onset after she was outside on Friday working in the yard.  She is exposed to pollen.  She has been using Mucinex and Sudafed with little improvement in symptoms.  In fact, she now feels that the mucus which was loose has gotten "stuck in her throat".  No shortness of breath or wheeze.  No recent travel.  No fevers.  No known contact with COVID-19.  She has not used any oral antihistamines.   ROS: Per HPI  Allergies  Allergen Reactions  . Ciprofloxacin Other (See Comments)    Tendonitis   . Penicillins Hives    Has patient had a PCN reaction causing immediate rash, facial/tongue/throat swelling, SOB or lightheadedness with hypotension: No Has patient had a PCN reaction causing severe rash involving mucus membranes or skin necrosis: No Has patient had a PCN reaction that required hospitalization No Has patient had a PCN reaction occurring within the last 10 years: No If all of the above answers are "NO", then may proceed with Cephalosporin use.    Past Medical History:  Diagnosis Date  . Anxiety   . Depression     Current Outpatient Medications:  .  ALPRAZolam (XANAX) 0.5 MG tablet, Take 1 tablet (0.5 mg total) by mouth 3 (three) times daily as needed for sleep or anxiety., Disp: 90 tablet, Rfl: 2 .  escitalopram (LEXAPRO) 20 MG tablet, Take 1 tablet (20 mg total) by mouth daily., Disp: 90 tablet, Rfl: 2 .  famotidine (PEPCID) 20 MG tablet, Take 1 tablet (20 mg total) by mouth 2 (two) times daily., Disp: 60 tablet, Rfl: 1 .  naproxen (NAPROSYN) 500  MG tablet, Take 1 tablet (500 mg total) by mouth 2 (two) times daily with a meal., Disp: 60 tablet, Rfl: 1 .  predniSONE (STERAPRED UNI-PAK 21 TAB) 10 MG (21) TBPK tablet, Use as directed, Disp: 21 tablet, Rfl: 0  Assessment/ Plan: 23 y.o. female   1. Laryngitis Likely allergy mediated versus viral mediated.  We will start prednisone, Zyrtec and Flonase in efforts to reduce allergy symptoms.  We discussed reasons for emergent evaluation emergency department.  She voiced good understanding.  I have advised her to isolate at home given uncertain nature of symptoms.  I would like her to stay out of work until she is been totally asymptomatic unmedicated for 3 consecutive days per CDC guidelines.  She voiced good understanding. - predniSONE (STERAPRED UNI-PAK 21 TAB) 10 MG (21) TBPK tablet; Use as directed  Dispense: 21 tablet; Refill: 0 - cetirizine (ZYRTEC) 10 MG tablet; Take 1 tablet (10 mg total) by mouth daily.  Dispense: 30 tablet; Refill: 11  2. Seasonal allergic rhinitis due to pollen As above - predniSONE (STERAPRED UNI-PAK 21 TAB) 10 MG (21) TBPK tablet; Use as directed  Dispense: 21 tablet; Refill: 0 - cetirizine (ZYRTEC) 10 MG tablet; Take 1 tablet (10 mg total) by mouth daily.  Dispense: 30 tablet; Refill: 11 - fluticasone (FLONASE) 50 MCG/ACT nasal spray; Place 2 sprays into both nostrils daily.  Dispense: 16 g; Refill: 6  3. Advice Given About Covid-19 Virus by Telephone  Work note will be sent to her e-record  Start time: Attempted to call at 2:17pm (VM full); tried to reach again at 2:19pm; called mother at 2:21pm (she will have patient call office to start visit); tried to contact again at 2:46pm End time: 3:09pm  Total time spent on patient care (including telephone call/ virtual visit): 20 minutes  Dangelo Guzzetta Hulen Skains, DO [facility] [phone]

## 2018-10-30 NOTE — Telephone Encounter (Signed)
PT missed a call from Dr Nadine Counts for her telephone visit pt is calling back.

## 2018-10-30 NOTE — Patient Instructions (Signed)
Allergic Rhinitis, Adult Allergic rhinitis is an allergic reaction that affects the mucous membrane inside the nose. It causes sneezing, a runny or stuffy nose, and the feeling of mucus going down the back of the throat (postnasal drip). Allergic rhinitis can be mild to severe. There are two types of allergic rhinitis:  Seasonal. This type is also called hay fever. It happens only during certain seasons.  Perennial. This type can happen at any time of the year. What are the causes? This condition happens when the body's defense system (immune system) responds to certain harmless substances called allergens as though they were germs.  Seasonal allergic rhinitis is triggered by pollen, which can come from grasses, trees, and weeds. Perennial allergic rhinitis may be caused by:  House dust mites.  Pet dander.  Mold spores. What are the signs or symptoms? Symptoms of this condition include:  Sneezing.  Runny or stuffy nose (nasal congestion).  Postnasal drip.  Itchy nose.  Tearing of the eyes.  Trouble sleeping.  Daytime sleepiness. How is this diagnosed? This condition may be diagnosed based on:  Your medical history.  A physical exam.  Tests to check for related conditions, such as: ? Asthma. ? Pink eye. ? Ear infection. ? Upper respiratory infection.  Tests to find out which allergens trigger your symptoms. These may include skin or blood tests. How is this treated? There is no cure for this condition, but treatment can help control symptoms. Treatment may include:  Taking medicines that block allergy symptoms, such as antihistamines. Medicine may be given as a shot, nasal spray, or pill.  Avoiding the allergen.  Desensitization. This treatment involves getting ongoing shots until your body becomes less sensitive to the allergen. This treatment may be done if other treatments do not help.  If taking medicine and avoiding the allergen does not work, new, stronger  medicines may be prescribed. Follow these instructions at home:  Find out what you are allergic to. Common allergens include smoke, dust, and pollen.  Avoid the things you are allergic to. These are some things you can do to help avoid allergens: ? Replace carpet with wood, tile, or vinyl flooring. Carpet can trap dander and dust. ? Do not smoke. Do not allow smoking in your home. ? Change your heating and air conditioning filter at least once a month. ? During allergy season:  Keep windows closed as much as possible.  Plan outdoor activities when pollen counts are lowest. This is usually during the evening hours.  When coming indoors, change clothing and shower before sitting on furniture or bedding.  Take over-the-counter and prescription medicines only as told by your health care provider.  Keep all follow-up visits as told by your health care provider. This is important. Contact a health care provider if:  You have a fever.  You develop a persistent cough.  You make whistling sounds when you breathe (you wheeze).  Your symptoms interfere with your normal daily activities. Get help right away if:  You have shortness of breath. Summary  This condition can be managed by taking medicines as directed and avoiding allergens.  Contact your health care provider if you develop a persistent cough or fever.  During allergy season, keep windows closed as much as possible. This information is not intended to replace advice given to you by your health care provider. Make sure you discuss any questions you have with your health care provider. Document Released: 04/10/2001 Document Revised: 08/23/2016 Document Reviewed: 08/23/2016 Elsevier Interactive  Patient Education  2019 Elsevier Inc. Laryngitis Laryngitis is irritation and swelling (inflammation) of your vocal cords. This condition causes symptoms such as:  A change in your voice. It may sound low and hoarse.  Loss of voice.   Coughing.  Sore throat.  Dry throat.  Stuffy nose. Depending on the cause, this condition may go away after a short time or may last for more than 3 weeks. Treatment often involves resting your voice and using medicines to soothe your throat. Follow these instructions at home: Medicines  Take over-the-counter and prescription medicines only as told by your doctor.  If you were prescribed an antibiotic medicine, take it as told by your doctor. Do not stop taking it even if you start to feel better. General instructions  Talk as little as possible. Also avoid whispering.  Write instead of talking. Do this until your voice is back to normal.  Drink enough fluid to keep your pee (urine) pale yellow.  Breathe in moist air. Use a humidifier if you live in a dry climate.  Do not use any products that have nicotine or tobacco in them, such as cigarettes and e-cigarettes. If you need help quitting, ask your doctor. Contact a doctor if:  You have a fever.  Your pain is worse.  Your symptoms do not get better in 2 weeks. Get help right away if:  You cough up blood.  You have trouble swallowing.  You have trouble breathing. Summary  Laryngitis is inflammation of your vocal cords.  This condition causes your voice to sound low and hoarse.  Rest your voice by talking as little as possible. Also avoid whispering. This information is not intended to replace advice given to you by your health care provider. Make sure you discuss any questions you have with your health care provider. Document Released: 07/05/2011 Document Revised: 07/03/2017 Document Reviewed: 07/03/2017 Elsevier Interactive Patient Education  2019 [employer].  Coronavirus (COVID-19) Are you at risk?  Are you at risk for the Coronavirus (COVID-19)?  To be considered HIGH RISK for Coronavirus (COVID-19), you have to meet the following criteria:  . Traveled to [place], [place], [place], [place] or [place]; or in  the [place] to [place], [place], [place], or [place]; and have fever, cough, and shortness of breath within the last 2 weeks of travel OR . Been in close contact with a person diagnosed with COVID-19 within the last 2 weeks and have fever, cough, and shortness of breath . IF YOU DO NOT MEET THESE CRITERIA, YOU ARE CONSIDERED LOW RISK FOR COVID-19.  What to do if you are HIGH RISK for COVID-19?  Marland Kitchen If you are having a medical emergency, call 911. . Seek medical care right away. Before you go to a doctor's office, urgent care or emergency department, call ahead and tell them about your recent travel, contact with someone diagnosed with COVID-19, and your symptoms. You should receive instructions from your physician's office regarding next steps of care.  . When you arrive at healthcare provider, tell the healthcare staff immediately you have returned from visiting [place], [place], [place], [place] or [place]; or traveled in the [place] to [place], [place], [place], or [place]; in the last two weeks or you have been in close contact with a person diagnosed with COVID-19 in the last 2 weeks.   . Tell the health care staff about your symptoms: fever, cough and shortness of breath. . After you have been seen  by a medical provider, you will be either: o Tested for (COVID-19) and discharged home on quarantine except to seek medical care if symptoms worsen, and asked to  - Stay home and avoid contact with others until you get your results (4-5 days)  - Avoid travel on public transportation if possible (such as bus, train, or airplane) or o Sent to the Emergency Department by EMS for evaluation, COVID-19 testing, and possible admission depending on your condition and test results.  What to do if you are LOW RISK for COVID-19?  Reduce your risk of any infection by using the same precautions used for avoiding the common cold or flu:  Marland Kitchen Wash your hands often with soap and warm  water for at least 20 seconds.  If soap and water are not readily available, use an alcohol-based hand sanitizer with at least 60% alcohol.  . If coughing or sneezing, cover your mouth and nose by coughing or sneezing into the elbow areas of your shirt or coat, into a tissue or into your sleeve (not your hands). . Avoid shaking hands with others and consider head nods or verbal greetings only. . Avoid touching your eyes, nose, or mouth with unwashed hands.  . Avoid close contact with people who are sick. . Avoid places or events with large numbers of people in one location, like concerts or sporting events. . Carefully consider travel plans you have or are making. . If you are planning any travel outside or inside the [place], visit the CDC's Travelers' Health webpage for the latest health notices. . If you have some symptoms but not all symptoms, continue to monitor at home and seek medical attention if your symptoms worsen. . If you are having a medical emergency, call 911.   ADDITIONAL HEALTHCARE OPTIONS FOR PATIENTS  [facility] / e-Visit: [email or web address]         [facility]: [phone]  [facility]: [phone]                   [facility]: [phone]

## 2018-11-03 ENCOUNTER — Other Ambulatory Visit: Payer: Self-pay

## 2018-11-03 ENCOUNTER — Ambulatory Visit (INDEPENDENT_AMBULATORY_CARE_PROVIDER_SITE_OTHER): Payer: BLUE CROSS/BLUE SHIELD | Admitting: Nurse Practitioner

## 2018-11-03 ENCOUNTER — Encounter: Payer: Self-pay | Admitting: Nurse Practitioner

## 2018-11-03 DIAGNOSIS — N3000 Acute cystitis without hematuria: Secondary | ICD-10-CM | POA: Diagnosis not present

## 2018-11-03 DIAGNOSIS — R300 Dysuria: Secondary | ICD-10-CM

## 2018-11-03 MED ORDER — DOXYCYCLINE HYCLATE 100 MG PO TABS: 100.0000 mg | ORAL_TABLET | Freq: Two times a day (BID) | ORAL | 0 refills | Status: DC

## 2018-11-03 NOTE — Progress Notes (Signed)
Patient ID: Kayla Sanchez, female   DOB: July 28, 1996, 23 y.o.   MRN: [id number]    Virtual Visit via telephone Note  I connected with Kayla Sanchez on 11/03/18 at 9:50 AM by telephone and verified that I am speaking with the correct person using two identifiers. Kayla Sanchez is currently located at home and no one is currently with her during visit. The provider, Mary-Margaret Daphine Deutscher, FNP is located in their office at time of visit.  I discussed the limitations, risks, security and privacy concerns of performing an evaluation and management service by telephone and the availability of in person appointments. I also discussed with the patient that there may be a patient responsible charge related to this service. The patient expressed understanding and agreed to proceed.   History and Present Illness:   Chief Complaint: dysuria  HPI Patient calls in stating that she started having symptoms of dysuria 2 days ago. She says  frequency and urgency  Started yesterday and this  Morning she is having mid back pain on left. She has been taking AZO OTC which has helped slightly.  Review of Systems  Constitutional: Negative.   HENT: Negative.   Respiratory: Negative.   Cardiovascular: Negative.   Genitourinary: Positive for dysuria, flank pain (left), frequency and urgency.  Skin: Negative.   Neurological: Negative.   Psychiatric/Behavioral: Negative.   All other systems reviewed and are negative.      Observations/Objective: Alert and oriented- answers al questions appropriately  Assessment and Plan: ALOMA ORDNER in today with chief complaint of Dysuria   1. Dysuria  2. Acute cystitis without hematuria Meds ordered this encounter  Medications  . doxycycline (VIBRA-TABS) 100 MG tablet    Sig: Take 1 tablet (100 mg total) by mouth 2 (two) times daily. 1 po bid    Dispense:  20 tablet    Refill:  0    Order Specific Question:   Supervising Provider   Answer:   Arville Care A [[id number]]   Take medication as prescribe Cotton underwear Take shower not bath Cranberry juice, yogurt Force fluids AZO over the counter X2 days RTO prn    Follow Up Instructions: prn    I discussed the assessment and treatment plan with the patient. The patient was provided an opportunity to ask questions and all were answered. The patient agreed with the plan and demonstrated an understanding of the instructions.   The patient was advised to call back or seek an in-person evaluation if the symptoms worsen or if the condition fails to improve as anticipated.  The above assessment and management plan was discussed with the patient. The patient verbalized understanding of and has agreed to the management plan. Patient is aware to call the clinic if symptoms persist or worsen. Patient is aware when to return to the clinic for a follow-up visit. Patient educated on when it is appropriate to go to the emergency department.    I provided 6 minutes of non-face-to-face time during this encounter.    Mary-Margaret Daphine Deutscher, FNP

## 2018-11-04 ENCOUNTER — Telehealth: Payer: Self-pay | Admitting: Pediatrics

## 2018-11-04 MED ORDER — DOXYCYCLINE HYCLATE 100 MG PO TABS: 100.0000 mg | ORAL_TABLET | Freq: Two times a day (BID) | ORAL | 0 refills | Status: DC

## 2018-11-04 NOTE — Telephone Encounter (Signed)
Sent to correct pharmacy- patient aware   

## 2018-11-10 ENCOUNTER — Telehealth: Payer: Self-pay | Admitting: Nurse Practitioner

## 2018-11-10 NOTE — Telephone Encounter (Signed)
Will forward to MMM to ensure note is authorized.

## 2018-11-10 NOTE — Telephone Encounter (Signed)
Work note is fine 

## 2018-11-11 ENCOUNTER — Encounter: Payer: Self-pay | Admitting: *Deleted

## 2018-11-11 NOTE — Telephone Encounter (Signed)
Pt needs a work note for her to be able to go back today. Please call when ready

## 2018-11-11 NOTE — Telephone Encounter (Signed)
Called and aware of note - ready up front.

## 2018-12-02 ENCOUNTER — Encounter (HOSPITAL_COMMUNITY): Payer: Self-pay | Admitting: Psychiatry

## 2018-12-02 ENCOUNTER — Other Ambulatory Visit (HOSPITAL_COMMUNITY): Payer: Self-pay | Admitting: Psychiatry

## 2018-12-02 ENCOUNTER — Other Ambulatory Visit: Payer: Self-pay

## 2018-12-02 ENCOUNTER — Ambulatory Visit (INDEPENDENT_AMBULATORY_CARE_PROVIDER_SITE_OTHER): Payer: BLUE CROSS/BLUE SHIELD | Admitting: Psychiatry

## 2018-12-02 DIAGNOSIS — F410 Panic disorder [episodic paroxysmal anxiety] without agoraphobia: Secondary | ICD-10-CM | POA: Diagnosis not present

## 2018-12-02 MED ORDER — ESCITALOPRAM OXALATE 20 MG PO TABS: 20.0000 mg | ORAL_TABLET | Freq: Two times a day (BID) | ORAL | 2 refills | Status: DC

## 2018-12-02 MED ORDER — ALPRAZOLAM 0.5 MG PO TABS: 0.5000 mg | ORAL_TABLET | Freq: Three times a day (TID) | ORAL | 2 refills | Status: DC | PRN

## 2018-12-02 NOTE — Progress Notes (Signed)
Virtual Visit via Video Note  I connected with Kayla Sanchez on 12/02/18 at 11:20 AM EDT by a video enabled telemedicine application and verified that I am speaking with the correct person using two identifiers.   I discussed the limitations of evaluation and management by telemedicine and the availability of in person appointments. The patient expressed understanding and agreed to proceed.      I discussed the assessment and treatment plan with the patient. The patient was provided an opportunity to ask questions and all were answered. The patient agreed with the plan and demonstrated an understanding of the instructions.   The patient was advised to call back or seek an in-person evaluation if the symptoms worsen or if the condition fails to improve as anticipated.  I provided 15 minutes of non-face-to-face time during this encounter.   Levonne Spiller, MD  [facility] MD/PA/NP OP Progress Note  12/02/2018 11:52 AM Kayla Sanchez  MRN:  [id number]  Chief Complaint:  Chief Complaint    Depression; Anxiety; Follow-up     HPI: This patient is a 23 year old single white female who lives with her 2 children ages 75 and 56 in [place].  She is a Heritage manager at [employer].  The patient returns after long absence.  She was last seen about 10 months ago.  She states that she has moved jobs to a [employer] but she is not liking it.  The hours are 11-8 and she always feels like she is stuck in the middle of the day.  She is still caring for her-2 children.  She and their father have split up and she is dating another man now and it is going well.  She does feel worried about catching coronavirus at work she does not always like to wear the mask because she cannot breathe.  She feels more stressed in general more anxious and is having a bit more trouble sleeping.  She denies any thoughts of suicide or self-harm.  I suggested that we increase her Lexapro given her symptoms and  she agrees Visit Diagnosis:    ICD-10-CM   1. Panic disorder F41.0 escitalopram (LEXAPRO) 20 MG tablet    Past Psychiatric History: none  Past Medical History:  Past Medical History:  Diagnosis Date  . Anxiety   . Depression     Past Surgical History:  Procedure Laterality Date  . WISDOM TOOTH EXTRACTION      Family Psychiatric History: see below  Family History:  Family History  Problem Relation Age of Onset  . Depression Mother   . Anxiety disorder Mother   . Drug abuse Father   . Anxiety disorder Maternal Aunt   . Alcohol abuse Maternal Grandfather   . Anxiety disorder Maternal Grandfather     Social History:  Social History   Socioeconomic History  . Marital status: Single    Spouse name: Not on file  . Number of children: Not on file  . Years of education: Not on file  . Highest education level: Not on file  Occupational History  . Not on file  Social Needs  . Financial resource strain: Not on file  . Food insecurity:    Worry: Not on file    Inability: Not on file  . Transportation needs:    Medical: Not on file    Non-medical: Not on file  Tobacco Use  . Smoking status: Current Every Day Smoker    Packs/day: 1.00  Years: 4.00    Pack years: 4.00    Types: Cigarettes    Last attempt to quit: 06/29/2016    Years since quitting: 2.4  . Smokeless tobacco: Never Used  Substance and Sexual Activity  . Alcohol use: Yes    Comment: 3 beers 1 x per week  . Drug use: No    Comment: 05-21-2016 per pt 2 yrs ago  . Sexual activity: Yes    Birth control/protection: Condom  Lifestyle  . Physical activity:    Days per week: Not on file    Minutes per session: Not on file  . Stress: Not on file  Relationships  . Social connections:    Talks on phone: Not on file    Gets together: Not on file    Attends religious service: Not on file    Active member of club or organization: Not on file    Attends meetings of clubs or organizations: Not on file     Relationship status: Not on file  Other Topics Concern  . Not on file  Social History Narrative  . Not on file    Allergies:  Allergies  Allergen Reactions  . Ciprofloxacin Other (See Comments)    Tendonitis   . Penicillins Hives    Has patient had a PCN reaction causing immediate rash, facial/tongue/throat swelling, SOB or lightheadedness with hypotension: No Has patient had a PCN reaction causing severe rash involving mucus membranes or skin necrosis: No Has patient had a PCN reaction that required hospitalization No Has patient had a PCN reaction occurring within the last 10 years: No If all of the above answers are "NO", then may proceed with Cephalosporin use.     Metabolic Disorder Labs: No results found for: HGBA1C, MPG No results found for: PROLACTIN No results found for: CHOL, TRIG, HDL, CHOLHDL, VLDL, LDLCALC Lab Results  Component Value Date   TSH 0.635 07/31/2016    Therapeutic Level Labs: No results found for: LITHIUM No results found for: VALPROATE No components found for:  CBMZ  Current Medications: Current Outpatient Medications  Medication Sig Dispense Refill  . ALPRAZolam (XANAX) 0.5 MG tablet Take 1 tablet (0.5 mg total) by mouth 3 (three) times daily as needed for sleep or anxiety. 90 tablet 2  . cetirizine (ZYRTEC) 10 MG tablet Take 1 tablet (10 mg total) by mouth daily. 30 tablet 11  . doxycycline (VIBRA-TABS) 100 MG tablet Take 1 tablet (100 mg total) by mouth 2 (two) times daily. 1 po bid 20 tablet 0  . escitalopram (LEXAPRO) 20 MG tablet Take 1 tablet (20 mg total) by mouth 2 (two) times daily. 180 tablet 2  . famotidine (PEPCID) 20 MG tablet Take 1 tablet (20 mg total) by mouth 2 (two) times daily. 60 tablet 1  . fluticasone (FLONASE) 50 MCG/ACT nasal spray Place 2 sprays into both nostrils daily. 16 g 6  . naproxen (NAPROSYN) 500 MG tablet Take 1 tablet (500 mg total) by mouth 2 (two) times daily with a meal. 60 tablet 1  . predniSONE (STERAPRED  UNI-PAK 21 TAB) 10 MG (21) TBPK tablet Use as directed 21 tablet 0   No current facility-administered medications for this visit.      Musculoskeletal: Strength & Muscle Tone: within normal limits Gait & Station: normal Patient leans: N/A  Psychiatric Specialty Exam: Review of Systems  Neurological: Positive for tingling.  Psychiatric/Behavioral: Positive for depression. The patient is nervous/anxious.   All other systems reviewed and are negative.  There were no vitals taken for this visit.There is no height or weight on file to calculate BMI.  General Appearance: Casual and Fairly Groomed  Eye Contact:  Good  Speech:  Clear and Coherent  Volume:  Normal  Mood:  Anxious  Affect:  Appropriate and Congruent  Thought Process:  Goal Directed  Orientation:  Full (Time, Place, and Person)  Thought Content: Rumination   Suicidal Thoughts:  No  Homicidal Thoughts:  No  Memory:  Immediate;   Good Recent;   Good Remote;   Good  Judgement:  Fair  Insight:  Fair  Psychomotor Activity:  Normal  Concentration:  Concentration: Good and Attention Span: Good  Recall:  Good  Fund of Knowledge: Good  Language: Good  Akathisia:  No  Handed:  Right  AIMS (if indicated): not done  Assets:  Communication Skills Desire for Improvement Physical Health Resilience Social Support Talents/Skills  ADL's:  Intact  Cognition: WNL  Sleep:  Fair   Screenings: GAD-7     Office Visit from 04/19/2016 in [place] Visit from 02/28/2016 in [place]  Total GAD-7 Score  20  19    PHQ2-9     Office Visit from 02/10/2018 in [place] Visit from 10/03/2017 in [place] Visit from 07/18/2017 in [place] Visit from 07/10/2017 in [place] Visit from 04/19/2016 in [place]  PHQ-2 Total Score  0  0  0   0  4  PHQ-9 Total Score  -  -  -  -  15       Assessment and Plan: This patient is a 23 year old female with a history of anxiety and depression.  She seems a bit more anxious given the coronavirus and her workplace as well as her concerns about her schedule.  I suggested we increase Lexapro to 20 mg twice daily and continue Xanax 0.5 mg 3 times daily as needed.  She will return to see me in 6 weeks or call sooner if needed   Levonne Spiller, MD 12/02/2018, 11:52 AM

## 2019-01-13 ENCOUNTER — Other Ambulatory Visit: Payer: Self-pay

## 2019-01-13 ENCOUNTER — Telehealth (HOSPITAL_COMMUNITY): Payer: Self-pay | Admitting: Psychiatry

## 2019-01-13 ENCOUNTER — Ambulatory Visit (HOSPITAL_COMMUNITY): Payer: Medicaid Other | Admitting: Psychiatry

## 2019-02-12 ENCOUNTER — Telehealth: Payer: Self-pay | Admitting: Pediatrics

## 2019-02-12 NOTE — Telephone Encounter (Signed)
appt scheduled Pt notified 

## 2019-02-13 ENCOUNTER — Ambulatory Visit (INDEPENDENT_AMBULATORY_CARE_PROVIDER_SITE_OTHER): Payer: Self-pay | Admitting: Family

## 2019-02-13 ENCOUNTER — Encounter: Payer: Self-pay | Admitting: Family

## 2019-02-13 DIAGNOSIS — K219 Gastro-esophageal reflux disease without esophagitis: Secondary | ICD-10-CM

## 2019-02-13 MED ORDER — OMEPRAZOLE 20 MG PO CPDR: 20.0000 mg | DELAYED_RELEASE_CAPSULE | Freq: Every day | ORAL | 3 refills | Status: DC

## 2019-02-13 NOTE — Progress Notes (Signed)
   Virtual Visit via telephone Note  I connected with Kayla Sanchez on 02/13/19 at 11:48 AM by telephone and verified that I am speaking with the correct person using two identifiers. Kayla Sanchez is currently located at driving and no one is currently with her during visit. The provider, Evelina Dun, FNP is located in their office at time of visit.  I discussed the limitations, risks, security and privacy concerns of performing an evaluation and management service by telephone and the availability of in person appointments. I also discussed with the patient that there may be a patient responsible charge related to this service. The patient expressed understanding and agreed to proceed.   History and Present Illness:  Pt calls the office today with GERD symptoms that started 4-5 days that has improved. She states she feels like this may have been caused from drinking coke, because once she has stopped her symptoms have resolved.  Gastroesophageal Reflux She complains of abdominal pain, belching, chest pain, heartburn and nausea. This is a new problem. The current episode started in the past 7 days. The problem occurs frequently. The problem has been waxing and waning. The symptoms are aggravated by certain foods and smoking. She has tried an antacid for the symptoms. The treatment provided moderate relief.      Review of Systems  Cardiovascular: Positive for chest pain.  Gastrointestinal: Positive for abdominal pain, heartburn and nausea.  All other systems reviewed and are negative.    Observations/Objective: No SOB or distress noted   Assessment and Plan: 1. Gastroesophageal reflux disease, esophagitis presence not specified -Diet discussed- Avoid fried, spicy, citrus foods, caffeine and alcohol -Do not eat 2-3 hours before bedtime -Encouraged small frequent meals -Avoid NSAID's -RTO if symptoms worsen or do not improve  - omeprazole (PRILOSEC) 20 MG capsule; Take  1 capsule (20 mg total) by mouth daily.  Dispense: 30 capsule; Refill: 3     I discussed the assessment and treatment plan with the patient. The patient was provided an opportunity to ask questions and all were answered. The patient agreed with the plan and demonstrated an understanding of the instructions.   The patient was advised to call back or seek an in-person evaluation if the symptoms worsen or if the condition fails to improve as anticipated.  The above assessment and management plan was discussed with the patient. The patient verbalized understanding of and has agreed to the management plan. Patient is aware to call the clinic if symptoms persist or worsen. Patient is aware when to return to the clinic for a follow-up visit. Patient educated on when it is appropriate to go to the emergency department.   Time call ended:  12:01 pm  I provided 13 minutes of non-face-to-face time during this encounter.    Evelina Dun, FNP

## 2019-02-16 ENCOUNTER — Encounter (HOSPITAL_COMMUNITY): Payer: Self-pay | Admitting: Emergency Medicine

## 2019-02-16 ENCOUNTER — Emergency Department (HOSPITAL_COMMUNITY)
Admission: EM | Admit: 2019-02-16 | Discharge: 2019-02-16 | Disposition: A | Discharge status: Home / Self Care | Payer: Medicaid Other | Attending: Emergency Medicine | Admitting: Emergency Medicine

## 2019-02-16 ENCOUNTER — Other Ambulatory Visit: Payer: Self-pay

## 2019-02-16 ENCOUNTER — Emergency Department (HOSPITAL_COMMUNITY): Payer: Medicaid Other

## 2019-02-16 DIAGNOSIS — Z5321 Procedure and treatment not carried out due to patient leaving prior to being seen by health care provider: Secondary | ICD-10-CM | POA: Insufficient documentation

## 2019-02-16 DIAGNOSIS — R079 Chest pain, unspecified: Secondary | ICD-10-CM | POA: Insufficient documentation

## 2019-02-16 LAB — BASIC METABOLIC PANEL
Anion gap: 10 (ref 5–15)
BUN: 10 mg/dL (ref 6–20)
CO2: 24 mmol/L (ref 22–32)
Calcium: 9.5 mg/dL (ref 8.9–10.3)
Chloride: 103 mmol/L (ref 98–111)
Creatinine, Ser: 0.65 mg/dL (ref 0.44–1.00)
GFR calc Af Amer: >60 mL/min (ref >60)
GFR calc non Af Amer: >60 mL/min (ref >60)
Glucose, Bld: 101 mg/dL — ABNORMAL HIGH (ref 70–99)
Potassium: 3.5 mmol/L (ref 3.5–5.1)
Sodium: 137 mmol/L (ref 135–145)

## 2019-02-16 LAB — I-STAT BETA HCG BLOOD, ED (MC, WL, AP ONLY): I-stat hCG, quantitative: <5 m[IU]/mL (ref <5)

## 2019-02-16 LAB — CBC
HCT: 45.7 % (ref 36.0–46.0)
Hemoglobin: 15.5 g/dL — ABNORMAL HIGH (ref 12.0–15.0)
MCH: 32.9 pg (ref 26.0–34.0)
MCHC: 33.9 g/dL (ref 30.0–36.0)
MCV: 97 fL (ref 80.0–100.0)
Platelets: 227 10*3/uL (ref 150–400)
RBC: 4.71 MIL/uL (ref 3.87–5.11)
RDW: 13.2 % (ref 11.5–15.5)
WBC: 6.7 10*3/uL (ref 4.0–10.5)
nRBC: 0 % (ref 0.0–0.2)

## 2019-02-16 LAB — TROPONIN I (HIGH SENSITIVITY): Troponin I (High Sensitivity): <2 ng/L (ref <18)

## 2019-02-16 MED ORDER — SODIUM CHLORIDE 0.9% FLUSH: 3.0000 mL | Freq: Once | INTRAVENOUS | Status: DC

## 2019-02-16 NOTE — ED Notes (Signed)
calld for room x2 no answer

## 2019-02-16 NOTE — ED Triage Notes (Signed)
Pt reports chest and left arm pain for the last 4 days.

## 2019-02-16 NOTE — ED Notes (Signed)
Pt called for room x3 with no answer. 

## 2019-03-02 ENCOUNTER — Other Ambulatory Visit (HOSPITAL_COMMUNITY): Payer: Self-pay | Admitting: Psychiatry

## 2019-03-02 ENCOUNTER — Telehealth (HOSPITAL_COMMUNITY): Payer: Self-pay | Admitting: *Deleted

## 2019-03-02 MED ORDER — ALPRAZOLAM 0.5 MG PO TABS: 0.5000 mg | ORAL_TABLET | Freq: Three times a day (TID) | ORAL | 2 refills | Status: DC | PRN

## 2019-03-02 NOTE — Telephone Encounter (Signed)
PATIENT CALLED [facility] FOR HER XANAX REFILL & WAS TOLD THEY NEVER RECEIVED. PATIENT REQUESTED REFILL BE SENT TO [facility] IN [place] -Kayla Sanchez

## 2019-03-02 NOTE — Telephone Encounter (Signed)
sent 

## 2019-03-11 ENCOUNTER — Ambulatory Visit
Admission: EM | Admit: 2019-03-11 | Discharge: 2019-03-11 | Disposition: A | Discharge status: Home / Self Care | Payer: PRIVATE HEALTH INSURANCE | Attending: Emergency Medicine | Admitting: Emergency Medicine

## 2019-03-11 ENCOUNTER — Ambulatory Visit (INDEPENDENT_AMBULATORY_CARE_PROVIDER_SITE_OTHER): Payer: PRIVATE HEALTH INSURANCE | Admitting: Psychiatry

## 2019-03-11 ENCOUNTER — Encounter (HOSPITAL_COMMUNITY): Payer: Self-pay | Admitting: Psychiatry

## 2019-03-11 ENCOUNTER — Other Ambulatory Visit: Payer: Self-pay

## 2019-03-11 DIAGNOSIS — R0789 Other chest pain: Secondary | ICD-10-CM

## 2019-03-11 DIAGNOSIS — R070 Pain in throat: Secondary | ICD-10-CM

## 2019-03-11 DIAGNOSIS — F410 Panic disorder [episodic paroxysmal anxiety] without agoraphobia: Secondary | ICD-10-CM | POA: Diagnosis not present

## 2019-03-11 DIAGNOSIS — K219 Gastro-esophageal reflux disease without esophagitis: Secondary | ICD-10-CM

## 2019-03-11 MED ORDER — LIDOCAINE VISCOUS HCL 2 % MT SOLN
15.0000 mL | Freq: Once | OROMUCOSAL | Status: AC
  Administered 2019-03-11: 18:00:00 15 mL via ORAL

## 2019-03-11 MED ORDER — ESCITALOPRAM OXALATE 20 MG PO TABS: 20.0000 mg | ORAL_TABLET | Freq: Two times a day (BID) | ORAL | 2 refills | Status: DC

## 2019-03-11 MED ORDER — ALUM & MAG HYDROXIDE-SIMETH 200-200-20 MG/5ML PO SUSP: 30.0000 mL | Freq: Once | ORAL | Status: DC

## 2019-03-11 MED ORDER — ALPRAZOLAM 0.5 MG PO TABS: 0.5000 mg | ORAL_TABLET | Freq: Three times a day (TID) | ORAL | 2 refills | Status: DC | PRN

## 2019-03-11 MED ORDER — OMEPRAZOLE 20 MG PO CPDR: 20.0000 mg | DELAYED_RELEASE_CAPSULE | Freq: Every day | ORAL | 0 refills | Status: DC

## 2019-03-11 NOTE — Discharge Instructions (Signed)
Offered further evaluation and management in the ED.  Declines at this time.  Would like to try outpatient therapy first.    Symptoms likely secondary to acid reflux EKG without abnormalities GI cocktail given in office Omeprazole prescribed take daily  Avoid eating 2-3 hours before bed Elevate head of bed.  Avoid chocolate, caffeine, alcohol, onion, and mint prior to bed.  This relaxes the bottom part of your esophagus and can make your symptoms worse.  Follow up with PCP as needed if symptoms persist Return or go to the ED if you have any new or worsening symptoms fever, chills, nausea, vomiting, abdominal pain, chest pain, shortness of breath, changes in bowel or bladder function, etc..Marland Kitchen

## 2019-03-11 NOTE — ED Provider Notes (Signed)
[facility]   [id number] 03/11/19 Arrival Time: 5188   CC: CHEST pressure and throat discomfort  SUBJECTIVE:  Kayla Sanchez is a 23 y.o. female who presents with complaint of gradual chest pressure and discomfort in throat that began 1 week.  Does admit to recent acute bronchitis, and testing positive for COVID.  Localizes chest pressure to substernal region and LT chest.  Describes as stable, that is intermittent (with episodes lasting a few hours) and pressure in character.  States it is uncomfortable.   Has tried TUMs with minimal relief.  Symptoms made worse with smoking cigarettes, and eating.  Denies specific food triggers.  Denies radiating symptoms.  Denies previous symptoms in the past.  Complains of associated lightheadedness, and intermittent LUQ abdominal discomfort, mild constipation, numbness/tingling in bilateral arms and legs, and anxiety.   Denies fever, chills, dizziness, palpitations, tachycardia, SOB, nausea, vomiting, changes in bladder habits, diaphoresis, peripheral edema.    Denies SOB, calf pain or swelling, recent long travel, recent surgery, pregnancy, malignancy, hormone use, or previous blood clot  Admits to 1/2 - 1 PPD x 5 years  Denies close relatives with cardiac hx or stroke  Previous cardiac testing: chest x-ray, EKG normal  ROS: As per HPI.  All other pertinent ROS negative.    Past Medical History:  Diagnosis Date  . Anxiety   . Depression    Past Surgical History:  Procedure Laterality Date  . WISDOM TOOTH EXTRACTION     Allergies  Allergen Reactions  . Ciprofloxacin Other (See Comments)    Tendonitis   . Penicillins Hives    Has patient had a PCN reaction causing immediate rash, facial/tongue/throat swelling, SOB or lightheadedness with hypotension: No Has patient had a PCN reaction causing severe rash involving mucus membranes or skin necrosis: No Has patient had a PCN reaction that required hospitalization No Has patient  had a PCN reaction occurring within the last 10 years: No If all of the above answers are "NO", then may proceed with Cephalosporin use.    No current facility-administered medications on file prior to encounter.    Current Outpatient Medications on File Prior to Encounter  Medication Sig Dispense Refill  . ALPRAZolam (XANAX) 0.5 MG tablet Take 1 tablet (0.5 mg total) by mouth 3 (three) times daily as needed for sleep or anxiety. 90 tablet 2  . escitalopram (LEXAPRO) 20 MG tablet Take 1 tablet (20 mg total) by mouth 2 (two) times daily. 180 tablet 2   Social History   Socioeconomic History  . Marital status: Single    Spouse name: Not on file  . Number of children: Not on file  . Years of education: Not on file  . Highest education level: Not on file  Occupational History  . Not on file  Social Needs  . Financial resource strain: Not on file  . Food insecurity    Worry: Not on file    Inability: Not on file  . Transportation needs    Medical: Not on file    Non-medical: Not on file  Tobacco Use  . Smoking status: Current Every Day Smoker    Packs/day: 1.00    Years: 4.00    Pack years: 4.00    Types: Cigarettes    Last attempt to quit: 06/29/2016    Years since quitting: 2.6  . Smokeless tobacco: Never Used  Substance and Sexual Activity  . Alcohol use: Yes    Comment: 3 beers 1 x per week  .  Drug use: No    Comment: 05-21-2016 per pt 2 yrs ago  . Sexual activity: Yes    Birth control/protection: Condom  Lifestyle  . Physical activity    Days per week: Not on file    Minutes per session: Not on file  . Stress: Not on file  Relationships  . Social Musicianconnections    Talks on phone: Not on file    Gets together: Not on file    Attends religious service: Not on file    Active member of club or organization: Not on file    Attends meetings of clubs or organizations: Not on file    Relationship status: Not on file  . Intimate partner violence    Fear of current or ex  partner: Not on file    Emotionally abused: Not on file    Physically abused: Not on file    Forced sexual activity: Not on file  Other Topics Concern  . Not on file  Social History Narrative  . Not on file   Family History  Problem Relation Age of Onset  . Depression Mother   . Anxiety disorder Mother   . Drug abuse Father   . Anxiety disorder Maternal Aunt   . Alcohol abuse Maternal Grandfather   . Anxiety disorder Maternal Grandfather      OBJECTIVE:  Vitals:   03/11/19 1712  BP: 133/81  Pulse: 84  Resp: 20  Temp: 98.1 F (36.7 C)    General appearance: alert; no distress Eyes: PERRLA; EOMI; conjunctiva normal HENT: normocephalic; atraumatic; nares patent without rhinorrhea; oropharynx clear Neck: supple Lungs: clear to auscultation bilaterally without adventitious breath sounds Heart: regular rate and rhythm.  Clear S1 and S2 without rubs, gallops, or murmur. Abdomen: soft, non-tender; bowel sounds normal; no masses or organomegaly; no guarding or rebound tenderness Extremities: no cyanosis or edema; symmetrical with no gross deformities Skin: warm and dry Psychological: alert and cooperative; anxious mood and affect  ECG: Orders placed or performed during the hospital encounter of 03/11/19  . ED EKG  . ED EKG   EKG normal sinus rhythm without ST elevations, depressions, or prolonged PR interval.  No narrowing or widening of the QRS complexes.    ASSESSMENT & PLAN:  1. Chest pressure   2. Throat discomfort   3. Gastroesophageal reflux disease, esophagitis presence not specified     Meds ordered this encounter  Medications  . AND Linked Order Group   . alum & mag hydroxide-simeth (MAALOX/MYLANTA) 200-200-20 MG/5ML suspension 30 mL   . lidocaine (XYLOCAINE) 2 % viscous mouth solution 15 mL  . omeprazole (PRILOSEC) 20 MG capsule    Sig: Take 1 capsule (20 mg total) by mouth daily.    Dispense:  30 capsule    Refill:  0    Order Specific Question:    Supervising Provider    Answer:   Eustace MooreELSON, YVONNE SUE [[id number]]    Offered further evaluation and management in the ED.  Declines at this time.  Would like to try outpatient therapy first.    Symptoms likely secondary to acid reflux EKG without abnormalities GI cocktail given in office Omeprazole prescribed take daily  Avoid eating 2-3 hours before bed Elevate head of bed.  Avoid chocolate, caffeine, alcohol, onion, and mint prior to bed.  This relaxes the bottom part of your esophagus and can make your symptoms worse.  Follow up with PCP as needed if symptoms persist Return or go to the ED if  you have any new or worsening symptoms fever, chills, nausea, vomiting, abdominal pain, chest pain, shortness of breath, changes in bowel or bladder function, etc...  Chest pain precautions given. Reviewed expectations re: course of current medical issues. Questions answered. Outlined signs and symptoms indicating need for more acute intervention. Patient verbalized understanding. After Visit Summary given.   Rennis HardingWurst, Valorie Mcgrory, PA-C 03/11/19 1811

## 2019-03-11 NOTE — ED Triage Notes (Signed)
Pt had same experience a month ago, states it feels as if something is in her throat and describes pressure not pain, also numbness and tingling in arms and hands

## 2019-03-11 NOTE — Progress Notes (Signed)
Virtual Visit via Video Note  I connected with Kayla Sanchez on 03/11/19 at  2:20 PM EDT by a video enabled telemedicine application and verified that I am speaking with the correct person using two identifiers.   I discussed the limitations of evaluation and management by telemedicine and the availability of in person appointments. The patient expressed understanding and agreed to proceed.    I discussed the assessment and treatment plan with the patient. The patient was provided an opportunity to ask questions and all were answered. The patient agreed with the plan and demonstrated an understanding of the instructions.   The patient was advised to call back or seek an in-person evaluation if the symptoms worsen or if the condition fails to improve as anticipated.  I provided 15 minutes of non-face-to-face time during this encounter.   Levonne Spiller, MD  [facility] MD/PA/NP OP Progress Note  03/11/2019 2:42 PM Kayla Sanchez  MRN:  [id number]  Chief Complaint:  Chief Complaint    Anxiety; Depression; Follow-up     HPI: This patient is a 23 year old single white female who lives with HER-2 children in [place].  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  The patient returns after 3 months.  She states that she has not been doing well lately.  She is extremely anxious.  Her boyfriend had been living with her since November and they got very close.  On July 27 he left for basic training for the [employer] reserves.  She has been feeling "sick" ever since.  She describes chest pain tingling feeling in her arms headaches backaches reflux and significant anxiety all the time.Marland Kitchen  She was seen in the emergency room and had an EKG that was normal.  She has been seen at primary care for reflux and Prilosec was prescribed but she never picked it up she also was seen for headache.  She is worried that "something is seriously physically wrong with me."  I explained that given her age her  normal EKG and other studies this is very unlikely and most of her symptoms sound stress related.  She has made an appointment to go in for a complete physical which is probably reasonable.  The patient states that since her boyfriend left she has been crying a lot and distraught.  She is able to function at work but is always feeling "like there is a lump in my throat."  Her children have been gone with their father but will be coming back next week which will give her something to focus on.  She does have friends but does not want to burden them with her problems.  She had seen Maurice Small in the past in our office and I think returning to therapy would be a good thing.  She had never started that increase Lexapro as I suggested last visit so I am again going to suggest that she go up to 20 mg twice daily to help with her depression and anxiety.  She is also somewhat afraid of taking the Xanax but I told her to take 0.5 mg 3 times daily on a schedule for about a week to try to get her anxiety symptoms down.  She is agreeable with these recommendations.  She was so worried that she recently had a coronavirus test that was negative but she had to stay in quarantine for about 2 weeks.   Visit Diagnosis:    ICD-10-CM   1. Panic disorder  F41.0 escitalopram (LEXAPRO) 20 MG tablet    Past Psychiatric History:none  Past Medical History:  Past Medical History:  Diagnosis Date  . Anxiety   . Depression     Past Surgical History:  Procedure Laterality Date  . WISDOM TOOTH EXTRACTION      Family Psychiatric History: see below  Family History:  Family History  Problem Relation Age of Onset  . Depression Mother   . Anxiety disorder Mother   . Drug abuse Father   . Anxiety disorder Maternal Aunt   . Alcohol abuse Maternal Grandfather   . Anxiety disorder Maternal Grandfather     Social History:  Social History   Socioeconomic History  . Marital status: Single    Spouse name: Not on file   . Number of children: Not on file  . Years of education: Not on file  . Highest education level: Not on file  Occupational History  . Not on file  Social Needs  . Financial resource strain: Not on file  . Food insecurity    Worry: Not on file    Inability: Not on file  . Transportation needs    Medical: Not on file    Non-medical: Not on file  Tobacco Use  . Smoking status: Current Every Day Smoker    Packs/day: 1.00    Years: 4.00    Pack years: 4.00    Types: Cigarettes    Last attempt to quit: 06/29/2016    Years since quitting: 2.6  . Smokeless tobacco: Never Used  Substance and Sexual Activity  . Alcohol use: Yes    Comment: 3 beers 1 x per week  . Drug use: No    Comment: 05-21-2016 per pt 2 yrs ago  . Sexual activity: Yes    Birth control/protection: Condom  Lifestyle  . Physical activity    Days per week: Not on file    Minutes per session: Not on file  . Stress: Not on file  Relationships  . Social Herbalist on phone: Not on file    Gets together: Not on file    Attends religious service: Not on file    Active member of club or organization: Not on file    Attends meetings of clubs or organizations: Not on file    Relationship status: Not on file  Other Topics Concern  . Not on file  Social History Narrative  . Not on file    Allergies:  Allergies  Allergen Reactions  . Ciprofloxacin Other (See Comments)    Tendonitis   . Penicillins Hives    Has patient had a PCN reaction causing immediate rash, facial/tongue/throat swelling, SOB or lightheadedness with hypotension: No Has patient had a PCN reaction causing severe rash involving mucus membranes or skin necrosis: No Has patient had a PCN reaction that required hospitalization No Has patient had a PCN reaction occurring within the last 10 years: No If all of the above answers are "NO", then may proceed with Cephalosporin use.     Metabolic Disorder Labs: No results found for: HGBA1C,  MPG No results found for: PROLACTIN No results found for: CHOL, TRIG, HDL, CHOLHDL, VLDL, LDLCALC Lab Results  Component Value Date   TSH 0.635 07/31/2016    Therapeutic Level Labs: No results found for: LITHIUM No results found for: VALPROATE No components found for:  CBMZ  Current Medications: Current Outpatient Medications  Medication Sig Dispense Refill  . ALPRAZolam (XANAX) 0.5 MG tablet Take  1 tablet (0.5 mg total) by mouth 3 (three) times daily as needed for sleep or anxiety. 90 tablet 2  . escitalopram (LEXAPRO) 20 MG tablet Take 1 tablet (20 mg total) by mouth 2 (two) times daily. 180 tablet 2  . omeprazole (PRILOSEC) 20 MG capsule Take 1 capsule (20 mg total) by mouth daily. 30 capsule 3   No current facility-administered medications for this visit.      Musculoskeletal: Strength & Muscle Tone: within normal limits Gait & Station: normal Patient leans: N/A  Psychiatric Specialty Exam: Review of Systems  Cardiovascular: Positive for chest pain.  Gastrointestinal: Positive for heartburn.  Psychiatric/Behavioral: Positive for depression. The patient is nervous/anxious.   All other systems reviewed and are negative.   Last menstrual period 02/12/2019, unknown if currently breastfeeding.There is no height or weight on file to calculate BMI.  General Appearance: Neat and Well Groomed  Eye Contact:  Good  Speech:  Clear and Coherent  Volume:  Normal  Mood:  Anxious and Dysphoric  Affect:  Depressed  Thought Process:  Goal Directed  Orientation:  Full (Time, Place, and Person)  Thought Content: Rumination   Suicidal Thoughts:  No  Homicidal Thoughts:  No  Memory:  Immediate;   Good Recent;   Good Remote;   Good  Judgement:  Fair  Insight:  Shallow  Psychomotor Activity:  Normal  Concentration:  Concentration: Fair and Attention Span: Fair  Recall:  Good  Fund of Knowledge: Good  Language: Good  Akathisia:  No  Handed:  Right  AIMS (if indicated): not done   Assets:  Communication Skills Desire for Improvement Physical Health Resilience Social Support Talents/Skills  ADL's:  Intact  Cognition: WNL  Sleep:  Fair   Screenings: GAD-7     Office Visit from 04/19/2016 in [facility] Visit from 02/28/2016 in [facility]  Total GAD-7 Score  20  19    PHQ2-9     Office Visit from 02/10/2018 in [facility] Visit from 10/03/2017 in [facility] Visit from 07/18/2017 in [facility] Visit from 07/10/2017 in [facility] Visit from 04/19/2016 in [facility]  PHQ-2 Total Score  0  0  0  0  4  PHQ-9 Total Score  -  -  -  -  15       Assessment and Plan: This patient is a 23 year old female with a history of anxiety and depression.  Her anxiety is much worse since her boyfriend left and she feels like she is doing everything alone.  I challenged her to reach out to friends and family for support.  We will also get her back into counseling.  She will increase Lexapro to 20 mg twice daily and also take Xanax 0.5 mg 3 times daily on a scheduled basis.  She will return to see me in 4 weeks or call sooner if needed   Levonne Spiller, MD 03/11/2019, 2:42 PM

## 2019-04-09 ENCOUNTER — Ambulatory Visit (INDEPENDENT_AMBULATORY_CARE_PROVIDER_SITE_OTHER): Payer: PRIVATE HEALTH INSURANCE | Admitting: Psychiatry

## 2019-04-09 ENCOUNTER — Encounter (HOSPITAL_COMMUNITY): Payer: Self-pay | Admitting: Psychiatry

## 2019-04-09 ENCOUNTER — Other Ambulatory Visit: Payer: Self-pay

## 2019-04-09 DIAGNOSIS — F410 Panic disorder [episodic paroxysmal anxiety] without agoraphobia: Secondary | ICD-10-CM | POA: Diagnosis not present

## 2019-04-09 DIAGNOSIS — F329 Major depressive disorder, single episode, unspecified: Secondary | ICD-10-CM

## 2019-04-09 NOTE — Progress Notes (Signed)
Virtual Visit via Video Note  I connected with Kayla Sanchez on 04/09/19 at  2:00 PM EDT by a video enabled telemedicine application and verified that I am speaking with the correct person using two identifiers.   I discussed the limitations of evaluation and management by telemedicine and the availability of in person appointments. The patient expressed understanding and agreed to proceed.    I discussed the assessment and treatment plan with the patient. The patient was provided an opportunity to ask questions and all were answered. The patient agreed with the plan and demonstrated an understanding of the instructions.   The patient was advised to call back or seek an in-person evaluation if the symptoms worsen or if the condition fails to improve as anticipated.  I provided 15 minutes of non-face-to-face time during this encounter.   Levonne Spiller, MD  [facility] MD/PA/NP OP Progress Note  04/09/2019 2:19 PM Kayla Sanchez  MRN:  [id number]  Chief Complaint:  Chief Complaint    Depression; Anxiety; Follow-up     HPI: This patient is a 23 year old single white female who lives with her-2 children in [place].  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  Last month she was not doing well because her boyfriend had left for the [employer] reserve training.  She is having a very hard time dealing with his absence.  She was having a lot of physical symptoms such as reflux, chest pain chest pain.  She even went back to the urgent care on the same day I saw her last month and they thought she had reflux again.  She is thinks that she went through COVID-19 a few weeks ago as she had 1+ and 1- test but she is over that now.  The patient states that she is feeling better.  Her boyfriend also had COVID-19 and was in quarantine.  He is just now able to start on his reserve training.  They have been able to talk once a week and she feels  reassured.  She is no longer focused on all her somatic issues or feeling like something is seriously wrong with her.  She has had numerous exams and lab work and everything is checking out normal.  She never did increase the Lexapro and only uses the Xanax occasionally.  She is generally sleeping fairly well and functioning well at work.  She never did get set up with the counseling and we will try to get this done today Visit Diagnosis:    ICD-10-CM   1. Panic disorder  F41.0     Past Psychiatric History: None  Past Medical History:  Past Medical History:  Diagnosis Date  . Anxiety   . Depression     Past Surgical History:  Procedure Laterality Date  . WISDOM TOOTH EXTRACTION      Family Psychiatric History: see below  Family History:  Family History  Problem Relation Age of Onset  . Depression Mother   . Anxiety disorder Mother   . Drug abuse Father   . Anxiety disorder Maternal Aunt   . Alcohol abuse Maternal Grandfather   . Anxiety disorder Maternal Grandfather     Social History:  Social History   Socioeconomic History  . Marital status: Single    Spouse name: Not on file  . Number of children: Not on file  . Years of education: Not on file  . Highest education level: Not  on file  Occupational History  . Not on file  Social Needs  . Financial resource strain: Not on file  . Food insecurity    Worry: Not on file    Inability: Not on file  . Transportation needs    Medical: Not on file    Non-medical: Not on file  Tobacco Use  . Smoking status: Current Every Day Smoker    Packs/day: 1.00    Years: 4.00    Pack years: 4.00    Types: Cigarettes    Last attempt to quit: 06/29/2016    Years since quitting: 2.7  . Smokeless tobacco: Never Used  Substance and Sexual Activity  . Alcohol use: Yes    Comment: 3 beers 1 x per week  . Drug use: No    Comment: 05-21-2016 per pt 2 yrs ago  . Sexual activity: Yes    Birth control/protection: Condom  Lifestyle   . Physical activity    Days per week: Not on file    Minutes per session: Not on file  . Stress: Not on file  Relationships  . Social Herbalist on phone: Not on file    Gets together: Not on file    Attends religious service: Not on file    Active member of club or organization: Not on file    Attends meetings of clubs or organizations: Not on file    Relationship status: Not on file  Other Topics Concern  . Not on file  Social History Narrative  . Not on file    Allergies:  Allergies  Allergen Reactions  . Ciprofloxacin Other (See Comments)    Tendonitis   . Penicillins Hives    Has patient had a PCN reaction causing immediate rash, facial/tongue/throat swelling, SOB or lightheadedness with hypotension: No Has patient had a PCN reaction causing severe rash involving mucus membranes or skin necrosis: No Has patient had a PCN reaction that required hospitalization No Has patient had a PCN reaction occurring within the last 10 years: No If all of the above answers are "NO", then may proceed with Cephalosporin use.     Metabolic Disorder Labs: No results found for: HGBA1C, MPG No results found for: PROLACTIN No results found for: CHOL, TRIG, HDL, CHOLHDL, VLDL, LDLCALC Lab Results  Component Value Date   TSH 0.635 07/31/2016    Therapeutic Level Labs: No results found for: LITHIUM No results found for: VALPROATE No components found for:  CBMZ  Current Medications: Current Outpatient Medications  Medication Sig Dispense Refill  . ALPRAZolam (XANAX) 0.5 MG tablet Take 1 tablet (0.5 mg total) by mouth 3 (three) times daily as needed for sleep or anxiety. 90 tablet 2  . escitalopram (LEXAPRO) 20 MG tablet Take 1 tablet (20 mg total) by mouth 2 (two) times daily. 180 tablet 2  . omeprazole (PRILOSEC) 20 MG capsule Take 1 capsule (20 mg total) by mouth daily. 30 capsule 0   No current facility-administered medications for this visit.       Musculoskeletal: Strength & Muscle Tone: within normal limits Gait & Station: normal Patient leans: N/A  Psychiatric Specialty Exam: Review of Systems  All other systems reviewed and are negative.   unknown if currently breastfeeding.There is no height or weight on file to calculate BMI.  General Appearance: Casual, Neat and Well Groomed  Eye Contact:  Good  Speech:  Clear and Coherent  Volume:  Normal  Mood:  Anxious  Affect:  Appropriate and  Congruent  Thought Process:  Goal Directed  Orientation:  Full (Time, Place, and Person)  Thought Content: Rumination   Suicidal Thoughts:  No  Homicidal Thoughts:  No  Memory:  Immediate;   Good Recent;   Good Remote;   Good  Judgement:  Fair  Insight:  Fair  Psychomotor Activity:  Normal  Concentration:  Concentration: Good and Attention Span: Good  Recall:  Good  Fund of Knowledge: Good  Language: Good  Akathisia:  No  Handed:  Right  AIMS (if indicated): not done  Assets:  Communication Skills Desire for Improvement Physical Health Resilience Social Support Talents/Skills  ADL's:  Intact  Cognition: WNL  Sleep:  Good   Screenings: GAD-7     Office Visit from 04/19/2016 in [place] Visit from 02/28/2016 in [place]  Total GAD-7 Score  20  19    PHQ2-9     Office Visit from 02/10/2018 in [place] Visit from 10/03/2017 in [place] Office Visit from 07/18/2017 in [place] Office Visit from 07/10/2017 in [place] Visit from 04/19/2016 in [place]  PHQ-2 Total Score  0  0  0  0  4  PHQ-9 Total Score  -  -  -  -  15       Assessment and Plan: This patient is a 23 year old female with a history of anxiety and depression.  She did get worse for a while after her boyfriend had to leave but now she seems to have readjusted.  I  still think she needs counseling to help her deal with these changes.  She will continue Lexapro 20 mg daily for depression and Xanax 0.5 mg up to 3 times daily as needed for anxiety.  She will return to see me in 2 months   Levonne Spiller, MD 04/09/2019, 2:19 PM

## 2019-07-28 ENCOUNTER — Ambulatory Visit
Admission: EM | Admit: 2019-07-28 | Discharge: 2019-07-28 | Disposition: A | Discharge status: Home / Self Care | Payer: PRIVATE HEALTH INSURANCE | Attending: Emergency Medicine | Admitting: Emergency Medicine

## 2019-07-28 ENCOUNTER — Other Ambulatory Visit: Payer: Self-pay

## 2019-07-28 DIAGNOSIS — J3489 Other specified disorders of nose and nasal sinuses: Secondary | ICD-10-CM | POA: Diagnosis not present

## 2019-07-28 DIAGNOSIS — G44019 Episodic cluster headache, not intractable: Secondary | ICD-10-CM | POA: Diagnosis not present

## 2019-07-28 DIAGNOSIS — R42 Dizziness and giddiness: Secondary | ICD-10-CM | POA: Diagnosis not present

## 2019-07-28 MED ORDER — FLUTICASONE PROPIONATE 50 MCG/ACT NA SUSP: 1.0000 | Freq: Every day | NASAL | 0 refills | Status: DC

## 2019-07-28 NOTE — Discharge Instructions (Signed)
Advised patient to take medication as prescribed Increased fluid intake and eat nutritious food OTC tylenol for pain Use Flonase daily To return for worsening of symptoms

## 2019-07-28 NOTE — ED Triage Notes (Signed)
Pt presents with c/o dizziness and head ache that developed a couple weeks ago, pt also states her vision is blurry at times

## 2019-07-28 NOTE — ED Provider Notes (Signed)
[facility]    CSN: [id number] Arrival date & time: 07/28/19  1453      History   Chief Complaint Chief Complaint  Patient presents with  . Dizziness  . Headache    HPI Kayla Sanchez is a 23 y.o. female.   Kayla Sanchez 23 years old female presented to the urgent care for complaint of sinus pressure,  dizziness for the past week.  Denies a precipitating event, trauma, or recent URI within the symptoms worse izziness as unsteady to walk.  She stated it is intermittent.  has not tried any medication.  Nothing make her symptoms worse.  Denies tinnitus, chest pain, syncope, SOB, weakness, slurred speech, memory or emotional changes, facial drooping/ asymmetry, incoordination, numbness or tingling, abdominal pain, changes in bowel or bladder habits.    The history is provided by the patient. No language interpreter was used.  Dizziness Associated symptoms: headaches   Headache Associated symptoms: dizziness and sinus pressure     Past Medical History:  Diagnosis Date  . Anxiety   . Depression     Patient Active Problem List   Diagnosis Date Noted  . Depression 12/08/2012  . GAD (generalized anxiety disorder) 12/08/2012    Past Surgical History:  Procedure Laterality Date  . WISDOM TOOTH EXTRACTION      OB History    Gravida  1   Para      Term      Preterm      AB      Living        SAB      TAB      Ectopic      Multiple      Live Births               Home Medications    Prior to Admission medications   Medication Sig Start Date End Date Taking? Authorizing Provider  ALPRAZolam Prudy Feeler) 0.5 MG tablet Take 1 tablet (0.5 mg total) by mouth 3 (three) times daily as needed for sleep or anxiety. 03/11/19 03/10/20  Myrlene Broker, MD  escitalopram (LEXAPRO) 20 MG tablet Take 1 tablet (20 mg total) by mouth 2 (two) times daily. 03/11/19   Myrlene Broker, MD  fluticasone (FLONASE) 50 MCG/ACT nasal spray Place 1 spray into  both nostrils daily for 14 days. 07/28/19 08/11/19  Anamaria Dusenbury, Zachery Dakins, FNP  omeprazole (PRILOSEC) 20 MG capsule Take 1 capsule (20 mg total) by mouth daily. 03/11/19   Rennis Harding, PA-C    Family History Family History  Problem Relation Age of Onset  . Depression Mother   . Anxiety disorder Mother   . Drug abuse Father   . Anxiety disorder Maternal Aunt   . Alcohol abuse Maternal Grandfather   . Anxiety disorder Maternal Grandfather     Social History Social History   Tobacco Use  . Smoking status: Current Every Day Smoker    Packs/day: 1.00    Years: 4.00    Pack years: 4.00    Types: Cigarettes    Last attempt to quit: 06/29/2016    Years since quitting: 3.0  . Smokeless tobacco: Never Used  Substance Use Topics  . Alcohol use: Yes    Comment: 3 beers 1 x per week  . Drug use: No    Comment: 05-21-2016 per pt 2 yrs ago     Allergies   Ciprofloxacin and Penicillins   Review of Systems Review of Systems  Constitutional: Negative.  HENT: Positive for sinus pressure.   Respiratory: Negative.   Cardiovascular: Negative.   Neurological: Positive for dizziness and headaches.  ROS: All other are negativeds   Physical Exam Triage Vital Signs ED Triage Vitals  Enc Vitals Group     BP 07/28/19 1526 132/90     Pulse Rate 07/28/19 1526 87     Resp 07/28/19 1526 18     Temp 07/28/19 1526 98.9 F (37.2 C)     Temp src --      SpO2 07/28/19 1526 98 %     Weight --      Height --      Head Circumference --      Peak Flow --      Pain Score 07/28/19 1524 4     Pain Loc --      Pain Edu? --      Excl. in GC? --    No data found.  Updated Vital Signs BP 132/90   Pulse 87   Temp 98.9 F (37.2 C)   Resp 18   SpO2 98%   Visual Acuity Right Eye Distance:   Left Eye Distance:   Bilateral Distance:    Right Eye Near:   Left Eye Near:    Bilateral Near:     Physical Exam Constitutional:      General: She is not in acute distress.    Appearance: She  is well-developed and normal weight. She is not ill-appearing, toxic-appearing or diaphoretic.  HENT:     Head: Normocephalic.     Right Ear: Tympanic membrane, ear canal and external ear normal. There is no impacted cerumen.     Left Ear: Tympanic membrane, ear canal and external ear normal. There is no impacted cerumen.  Cardiovascular:     Rate and Rhythm: Normal rate and regular rhythm.     Pulses: Normal pulses.     Heart sounds: Normal heart sounds.  Pulmonary:     Effort: Pulmonary effort is normal. No respiratory distress.     Breath sounds: Normal breath sounds.  Chest:     Chest wall: No tenderness.  Abdominal:     General: Bowel sounds are normal.     Palpations: Abdomen is soft.  Neurological:     General: No focal deficit present.     Mental Status: She is alert and oriented to person, place, and time. Mental status is at baseline.     Cranial Nerves: Cranial nerves are intact. No cranial nerve deficit.     Sensory: Sensation is intact. No sensory deficit.     Motor: Motor function is intact. No weakness.     Coordination: Coordination is intact. Coordination normal.     Gait: Gait is intact. Gait normal.     Deep Tendon Reflexes:     Reflex Scores:      Patellar reflexes are 2+ on the right side and 2+ on the left side.     UC Treatments / Results  Labs (all labs ordered are listed, but only abnormal results are displayed) Labs Reviewed - No data to display  EKG   Radiology No results found.  Procedures Procedures (including critical care time)  Medications Ordered in UC Medications - No data to display  Initial Impression / Assessment and Plan / UC Course  I have reviewed the triage vital signs and the nursing notes.  Pertinent labs & imaging results that were available during my care of the patient were reviewed by me  and considered in my medical decision making (see chart for details).    Patient stable for discharge.  Benign physical exam.   Advised patient to  take Flonase as prescribed.  To return for worsening of symptoms.  Patient verbalized understanding of the plan of care.  Final Clinical Impressions(s) / UC Diagnoses   Final diagnoses:  Episodic cluster headache, not intractable  Sinus pressure  Dizziness, nonspecific     Discharge Instructions     Advised patient to take medication as prescribed Use Flonase daily To return for worsening of symptoms    ED Prescriptions    Medication Sig Dispense Auth. Provider   fluticasone (FLONASE) 50 MCG/ACT nasal spray Place 1 spray into both nostrils daily for 14 days. 16 g Emerson Monte, FNP     PDMP not reviewed this encounter.   Emerson Monte, [place] 07/28/19 1621

## 2019-08-18 ENCOUNTER — Ambulatory Visit: Payer: PRIVATE HEALTH INSURANCE | Attending: Internal Medicine

## 2019-08-18 ENCOUNTER — Other Ambulatory Visit: Payer: Self-pay

## 2019-08-18 DIAGNOSIS — Z20822 Contact with and (suspected) exposure to covid-19: Secondary | ICD-10-CM

## 2019-08-19 LAB — NOVEL CORONAVIRUS, NAA: SARS-CoV-2, NAA: NOT DETECTED

## 2019-08-20 ENCOUNTER — Ambulatory Visit
Admission: EM | Admit: 2019-08-20 | Discharge: 2019-08-20 | Disposition: A | Discharge status: Home / Self Care | Payer: Managed Care, Other (non HMO) | Attending: Emergency Medicine | Admitting: Emergency Medicine

## 2019-08-20 ENCOUNTER — Other Ambulatory Visit: Payer: Self-pay

## 2019-08-20 DIAGNOSIS — Z113 Encounter for screening for infections with a predominantly sexual mode of transmission: Secondary | ICD-10-CM | POA: Insufficient documentation

## 2019-08-20 DIAGNOSIS — R300 Dysuria: Secondary | ICD-10-CM | POA: Diagnosis present

## 2019-08-20 LAB — POCT URINALYSIS DIP (MANUAL ENTRY)
Glucose, UA: NEGATIVE mg/dL
Ketones, POC UA: NEGATIVE mg/dL
Leukocytes, UA: NEGATIVE
Nitrite, UA: NEGATIVE
Protein Ur, POC: NEGATIVE mg/dL
Spec Grav, UA: 1.025 (ref 1.010–1.025)
Urobilinogen, UA: 1 E.U./dL
pH, UA: 7 (ref 5.0–8.0)

## 2019-08-20 NOTE — ED Triage Notes (Signed)
Pt presents to UC for STD check. Pt is unsure if partner has an STD. They did not use protection. Hx of chlamydia w/ treatment. Pt reports a small amount of pain in vagina x2 days. Denies any other symptoms at this time.

## 2019-08-20 NOTE — Discharge Instructions (Addendum)
Point-of-care urinalysis showed trace of blood Vaginal self-swab obtained.  We will follow up with you regarding abnormal results Urine culture sent.  We will call you with the results.   Push fluids and get plenty of rest.   Follow up with PCP if symptoms persists Return here or go to ER if you have any new or worsening symptoms fever, chills, nausea, vomiting, abdominal or pelvic pain, painful intercourse, vaginal discharge, vaginal bleeding, persistent symptoms despite treatment, etc..Marland Kitchen

## 2019-08-20 NOTE — ED Provider Notes (Signed)
[facility]   [id number] 08/20/19 Arrival Time: 1454   [id number] DISCHARGE  SUBJECTIVE:  Kayla Sanchez is a 24 y.o. female who presents with complaints of gradual vaginal pain for the past 1 to 2 days.  She denies a precipitating event, recent sexual encounter or recent antibiotic use.  Patient is sexually active with 1 female partner.  Report no discharge at this time.  She has not used any medication.  Denies any worsening of symptoms.  She reports similar symptoms in the past and was diagnosed with UTI and was treated accordingly.  She denies fever, chills, nausea, vomiting, abdominal or pelvic pain, urinary symptoms, vaginal itching, vaginal odor, vaginal bleeding, dyspareunia, vaginal rashes or lesions.   No LMP recorded. (Menstrual status: IUD). Current birth control method: Compliant with BC:  ROS: As per HPI.  All other pertinent ROS negative.     Past Medical History:  Diagnosis Date  . Anxiety   . Depression    Past Surgical History:  Procedure Laterality Date  . WISDOM TOOTH EXTRACTION     Allergies  Allergen Reactions  . Ciprofloxacin Other (See Comments)    Tendonitis   . Penicillins Hives    Has patient had a PCN reaction causing immediate rash, facial/tongue/throat swelling, SOB or lightheadedness with hypotension: No Has patient had a PCN reaction causing severe rash involving mucus membranes or skin necrosis: No Has patient had a PCN reaction that required hospitalization No Has patient had a PCN reaction occurring within the last 10 years: No If all of the above answers are "NO", then may proceed with Cephalosporin use.    No current facility-administered medications on file prior to encounter.   Current Outpatient Medications on File Prior to Encounter  Medication Sig Dispense Refill  . ALPRAZolam (XANAX) 0.5 MG tablet Take 1 tablet (0.5 mg total) by mouth 3 (three) times daily as needed for sleep or anxiety. 90 tablet 2  . escitalopram  (LEXAPRO) 20 MG tablet Take 1 tablet (20 mg total) by mouth 2 (two) times daily. 180 tablet 2  . fluticasone (FLONASE) 50 MCG/ACT nasal spray Place 1 spray into both nostrils daily for 14 days. 16 g 0  . omeprazole (PRILOSEC) 20 MG capsule Take 1 capsule (20 mg total) by mouth daily. 30 capsule 0    Social History   Socioeconomic History  . Marital status: Single    Spouse name: Not on file  . Number of children: Not on file  . Years of education: Not on file  . Highest education level: Not on file  Occupational History  . Not on file  Tobacco Use  . Smoking status: Current Every Day Smoker    Packs/day: 1.00    Years: 4.00    Pack years: 4.00    Types: Cigarettes    Last attempt to quit: 06/29/2016    Years since quitting: 3.1  . Smokeless tobacco: Never Used  Substance and Sexual Activity  . Alcohol use: Yes    Comment: 3 beers 1 x per week  . Drug use: No    Comment: 05-21-2016 per pt 2 yrs ago  . Sexual activity: Yes    Birth control/protection: None  Other Topics Concern  . Not on file  Social History Narrative  . Not on file   Social Determinants of Health   Financial Resource Strain:   . Difficulty of Paying Living Expenses: Not on file  Food Insecurity:   . Worried About Programme researcher, broadcasting/film/video  in the Last Year: Not on file  . Ran Out of Food in the Last Year: Not on file  Transportation Needs:   . Lack of Transportation (Medical): Not on file  . Lack of Transportation (Non-Medical): Not on file  Physical Activity:   . Days of Exercise per Week: Not on file  . Minutes of Exercise per Session: Not on file  Stress:   . Feeling of Stress : Not on file  Social Connections:   . Frequency of Communication with Friends and Family: Not on file  . Frequency of Social Gatherings with Friends and Family: Not on file  . Attends Religious Services: Not on file  . Active Member of Clubs or Organizations: Not on file  . Attends Banker Meetings: Not on file  .  Marital Status: Not on file  Intimate Partner Violence:   . Fear of Current or Ex-Partner: Not on file  . Emotionally Abused: Not on file  . Physically Abused: Not on file  . Sexually Abused: Not on file   Family History  Problem Relation Age of Onset  . Depression Mother   . Anxiety disorder Mother   . Drug abuse Father   . Anxiety disorder Maternal Aunt   . Alcohol abuse Maternal Grandfather   . Anxiety disorder Maternal Grandfather     OBJECTIVE:  Vitals:   08/20/19 1502  BP: 133/86  Pulse: 84  Resp: 16  Temp: 98.2 F (36.8 C)  TempSrc: Oral  SpO2: 95%     General appearance: Alert, NAD, appears stated age Head: NCAT Throat: lips, mucosa, and tongue normal; teeth and gums normal Lungs: CTA bilaterally without adventitious breath sounds Heart: regular rate and rhythm.  Radial pulses 2+ symmetrical bilaterally Back: no CVA tenderness Abdomen: soft, non-tender; bowel sounds normal; no masses or organomegaly; no guarding or rebound tenderness GU: declines OR External examination without vulvar lesions or erythema Bimanual exam: Negative for cervical motion or adenexal tenderness; Speculum exam: Thick white/yellow discharge during pelvic exam.  Cervix visualized without erythema or lesions. Cervical swab obtained Skin: warm and dry Psychological:  Alert and cooperative. Normal mood and affect.  LABS:  Results for orders placed or performed during the hospital encounter of 08/20/19  POCT urinalysis dipstick  Result Value Ref Range   Color, UA yellow yellow   Clarity, UA clear clear   Glucose, UA negative negative mg/dL   Bilirubin, UA small (A) negative   Ketones, POC UA negative negative mg/dL   Spec Grav, UA 5.852 7.782 - 1.025   Blood, UA trace-intact (A) negative   pH, UA 7.0 5.0 - 8.0   Protein Ur, POC negative negative mg/dL   Urobilinogen, UA 1.0 0.2 or 1.0 E.U./dL   Nitrite, UA Negative Negative   Leukocytes, UA Negative Negative    Labs Reviewed  POCT  URINALYSIS DIP (MANUAL ENTRY) - Abnormal; Notable for the following components:      Result Value   Bilirubin, UA small (*)    Blood, UA trace-intact (*)    All other components within normal limits  URINE CULTURE  CERVICOVAGINAL ANCILLARY ONLY    ASSESSMENT & PLAN:  1. Dysuria   2. Screen for STD (sexually transmitted disease)     No orders of the defined types were placed in this encounter.   Pending: Labs Reviewed  POCT URINALYSIS DIP (MANUAL ENTRY) - Abnormal; Notable for the following components:      Result Value   Bilirubin, UA small (*)  Blood, UA trace-intact (*)    All other components within normal limits  URINE CULTURE  CERVICOVAGINAL ANCILLARY ONLY   Patient will be called if results are abnormal Advised patient to increase fluid intake To follow up with primary care or return for worsening of symptoms  DISCHARGE: Point-of-care urine analysis was negative Vaginal self-swab obtained.  We will follow up with you regarding abnormal results Urine culture sent.  We will call you with the results.   Push fluids and get plenty of rest.   Follow up with PCP if symptoms persists Return here or go to ER if you have any new or worsening symptoms fever, chills, nausea, vomiting, abdominal or pelvic pain, painful intercourse, vaginal discharge, vaginal bleeding, persistent symptoms despite treatment, etc...   Reviewed expectations re: course of current medical issues. Questions answered. Outlined signs and symptoms indicating need for more acute intervention. Patient verbalized understanding. After Visit Summary given.       Emerson Monte, [place] 08/20/19 1555

## 2019-08-22 LAB — URINE CULTURE

## 2019-08-24 ENCOUNTER — Telehealth: Payer: Self-pay | Admitting: *Deleted

## 2019-08-24 NOTE — Telephone Encounter (Signed)
Patient called given negative covid results . 

## 2019-08-25 ENCOUNTER — Telehealth: Payer: PRIVATE HEALTH INSURANCE | Admitting: Physician Assistant

## 2019-08-25 ENCOUNTER — Telehealth (HOSPITAL_COMMUNITY): Payer: Self-pay | Admitting: Emergency Medicine

## 2019-08-25 ENCOUNTER — Ambulatory Visit
Admission: EM | Admit: 2019-08-25 | Discharge: 2019-08-25 | Disposition: A | Discharge status: Home / Self Care | Payer: Managed Care, Other (non HMO) | Attending: Emergency Medicine | Admitting: Emergency Medicine

## 2019-08-25 DIAGNOSIS — Z113 Encounter for screening for infections with a predominantly sexual mode of transmission: Secondary | ICD-10-CM | POA: Diagnosis not present

## 2019-08-25 DIAGNOSIS — R300 Dysuria: Secondary | ICD-10-CM | POA: Diagnosis not present

## 2019-08-25 DIAGNOSIS — N760 Acute vaginitis: Secondary | ICD-10-CM

## 2019-08-25 NOTE — Telephone Encounter (Signed)
Unable to located pt specimen in main lab from visit on 08/20/2019. Contacted pt to return for recollection.

## 2019-08-25 NOTE — ED Triage Notes (Signed)
Pt here for recollection only. No charge, this is from visit from 08/20/2019

## 2019-08-25 NOTE — Progress Notes (Signed)
Hi Kayla Sanchez,   The best thing to do at this point is await the test results from your Urgent Care appointment.  Your treatment plan will be based on the results when they come back. In the meantime, you can try one of the following over the counter options for symptoms. Please note, these do not treat your problem. They may lessen the symptoms which are caused by the problem Replense or Vagisil feminine moisturizer, coconut oil, Glycerin has anti-yeast properties

## 2019-08-27 LAB — CERVICOVAGINAL ANCILLARY ONLY
Bacterial vaginitis: NEGATIVE
Candida vaginitis: POSITIVE — AB
Chlamydia: NEGATIVE
Neisseria Gonorrhea: POSITIVE — AB
Trichomonas: NEGATIVE

## 2019-08-28 ENCOUNTER — Telehealth: Payer: Self-pay

## 2019-08-28 ENCOUNTER — Ambulatory Visit (HOSPITAL_COMMUNITY)
Admission: EM | Admit: 2019-08-28 | Discharge: 2019-08-28 | Disposition: A | Discharge status: Home / Self Care | Payer: Managed Care, Other (non HMO) | Attending: Urgent Care | Admitting: Urgent Care

## 2019-08-28 ENCOUNTER — Telehealth (HOSPITAL_COMMUNITY): Payer: Self-pay | Admitting: Emergency Medicine

## 2019-08-28 ENCOUNTER — Other Ambulatory Visit: Payer: Self-pay

## 2019-08-28 ENCOUNTER — Encounter (HOSPITAL_COMMUNITY): Payer: Self-pay

## 2019-08-28 DIAGNOSIS — A549 Gonococcal infection, unspecified: Secondary | ICD-10-CM

## 2019-08-28 MED ORDER — CEFTRIAXONE SODIUM 500 MG IJ SOLR
500.0000 mg | Freq: Once | INTRAMUSCULAR | Status: AC
  Administered 2019-08-28: 500 mg via INTRAMUSCULAR

## 2019-08-28 MED ORDER — LIDOCAINE HCL (PF) 1 % IJ SOLN
INTRAMUSCULAR | Status: AC
  Filled 2019-08-28: qty 2

## 2019-08-28 MED ORDER — FLUCONAZOLE 200 MG PO TABS: 150.0000 mg | ORAL_TABLET | ORAL | 0 refills | Status: AC

## 2019-08-28 MED ORDER — CEFTRIAXONE SODIUM 500 MG IJ SOLR
INTRAMUSCULAR | Status: AC
  Filled 2019-08-28: qty 500

## 2019-08-28 NOTE — Telephone Encounter (Signed)
Candida (yeast) is positive.  Prescription for fluconazole was sent today.  Gonorrhea is positive.  Patient should return as soon as possible to the urgent care for treatment with 500mg  IM rocephin. Patient will not need to see a provider unless there are new symptoms she would like evaluated. Please refrain from sexual intercourse for now and for 7 days after treatment to give the medicine time to work. Sexual partners need to be notified and tested/treated. Condoms may reduce risk of reinfection. [facility] notified.   Patient contacted by phone and made aware of    results. Pt verbalized understanding and had all questions answered.  Will return today for treatment.

## 2019-08-28 NOTE — ED Notes (Signed)
Per Wallis Bamberg PA, pt is able to take Rocephin.

## 2019-08-28 NOTE — Telephone Encounter (Signed)
Diflucan sent to pharmacy.

## 2019-08-28 NOTE — ED Triage Notes (Signed)
Pt here for gonorrhea treatment. Orders received for 500mg  rocephin.

## 2019-08-28 NOTE — Telephone Encounter (Signed)
Pt called for std results, pt will need to come in for treatment, pt verbalized understanding

## 2019-09-05 ENCOUNTER — Other Ambulatory Visit (HOSPITAL_COMMUNITY): Payer: Self-pay | Admitting: Psychiatry

## 2019-09-07 NOTE — Telephone Encounter (Signed)
Per Provider: Pt needs appt. Called & spoke with patient appt scgeduled 09/08/19 2:20 pm

## 2019-09-07 NOTE — Telephone Encounter (Signed)
Pt needs appt

## 2019-09-08 ENCOUNTER — Other Ambulatory Visit: Payer: Self-pay

## 2019-09-08 ENCOUNTER — Encounter (HOSPITAL_COMMUNITY): Payer: Self-pay | Admitting: Psychiatry

## 2019-09-08 ENCOUNTER — Ambulatory Visit (INDEPENDENT_AMBULATORY_CARE_PROVIDER_SITE_OTHER): Payer: 59 | Admitting: Psychiatry

## 2019-09-08 DIAGNOSIS — F410 Panic disorder [episodic paroxysmal anxiety] without agoraphobia: Secondary | ICD-10-CM

## 2019-09-08 MED ORDER — ALPRAZOLAM 0.5 MG PO TABS: ORAL_TABLET | ORAL | 2 refills | Status: DC

## 2019-09-08 MED ORDER — ESCITALOPRAM OXALATE 20 MG PO TABS: 20.0000 mg | ORAL_TABLET | Freq: Every day | ORAL | 2 refills | Status: DC

## 2019-09-08 NOTE — Progress Notes (Signed)
Virtual Visit via Video Note  I connected with Kayla Sanchez on 09/08/19 at  2:20 PM EST by a video enabled telemedicine application and verified that I am speaking with the correct person using two identifiers.   I discussed the limitations of evaluation and management by telemedicine and the availability of in person appointments. The patient expressed understanding and agreed to proceed    I discussed the assessment and treatment plan with the patient. The patient was provided an opportunity to ask questions and all were answered. The patient agreed with the plan and demonstrated an understanding of the instructions.   The patient was advised to call back or seek an in-person evaluation if the symptoms worsen or if the condition fails to improve as anticipated.  I provided 15 minutes of non-face-to-face time during this encounter.   Levonne Spiller, MD  [facility] MD/PA/NP OP Progress Note  09/08/2019 2:29 PM Kayla Sanchez  MRN:  [id number]  Chief Complaint:  Chief Complaint    Depression; Anxiety; Follow-up     ZCH:YIFO patient is a 24 year old single white female who lives with her-2 children in [place].  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  She had missed some appointments.  She tells me that her boyfriend who was in the [employer] reserves has broken up with her.  She claims that he believes some rumors that she was seeing someone else and would not listen to her side.  He broke up with her on New Year's Eve and she has not heard from him since.  She was very sad about it for a while but seems to be doing better now.  He is coming back in March and she thinks he may want to see her again but she is not holding her breath on this.  In the meantime she is enjoying her job and time with her children.  She is not dating anyone else.  She still has some symptoms of anxiety at times such as feeling jittery and shaky.  She  sometimes feels better if she can drink more fluids and eat regular meals and I encouraged her to do this as well as to get adequate sleep.  She still thinks the Lexapro and Xanax have helped  considerably with her anxiety Visit Diagnosis:    ICD-10-CM   1. Panic disorder  F41.0 escitalopram (LEXAPRO) 20 MG tablet    Past Psychiatric History: None  Past Medical History:  Past Medical History:  Diagnosis Date  . Anxiety   . Depression     Past Surgical History:  Procedure Laterality Date  . WISDOM TOOTH EXTRACTION      Family Psychiatric History: See below  Family History:  Family History  Problem Relation Age of Onset  . Depression Mother   . Anxiety disorder Mother   . Drug abuse Father   . Anxiety disorder Maternal Aunt   . Alcohol abuse Maternal Grandfather   . Anxiety disorder Maternal Grandfather     Social History:  Social History   Socioeconomic History  . Marital status: Single    Spouse name: Not on file  . Number of children: Not on file  . Years of education: Not on file  . Highest education level: Not on file  Occupational History  . Not on file  Tobacco Use  . Smoking status: Current Every Day Smoker    Packs/day: 1.00    Years: 4.00  Pack years: 4.00    Types: Cigarettes    Last attempt to quit: 06/29/2016    Years since quitting: 3.1  . Smokeless tobacco: Never Used  Substance and Sexual Activity  . Alcohol use: Yes    Comment: 3 beers 1 x per week  . Drug use: No    Comment: 05-21-2016 per pt 2 yrs ago  . Sexual activity: Yes    Birth control/protection: None  Other Topics Concern  . Not on file  Social History Narrative  . Not on file   Social Determinants of Health   Financial Resource Strain:   . Difficulty of Paying Living Expenses: Not on file  Food Insecurity:   . Worried About Charity fundraiser in the Last Year: Not on file  . Ran Out of Food in the Last Year: Not on file  Transportation Needs:   . Lack of  Transportation (Medical): Not on file  . Lack of Transportation (Non-Medical): Not on file  Physical Activity:   . Days of Exercise per Week: Not on file  . Minutes of Exercise per Session: Not on file  Stress:   . Feeling of Stress : Not on file  Social Connections:   . Frequency of Communication with Friends and Family: Not on file  . Frequency of Social Gatherings with Friends and Family: Not on file  . Attends Religious Services: Not on file  . Active Member of Clubs or Organizations: Not on file  . Attends Archivist Meetings: Not on file  . Marital Status: Not on file    Allergies:  Allergies  Allergen Reactions  . Ciprofloxacin Other (See Comments)    Tendonitis   . Penicillins Hives    Has patient had a PCN reaction causing immediate rash, facial/tongue/throat swelling, SOB or lightheadedness with hypotension: No Has patient had a PCN reaction causing severe rash involving mucus membranes or skin necrosis: No Has patient had a PCN reaction that required hospitalization No Has patient had a PCN reaction occurring within the last 10 years: No If all of the above answers are "NO", then may proceed with Cephalosporin use.     Metabolic Disorder Labs: No results found for: HGBA1C, MPG No results found for: PROLACTIN No results found for: CHOL, TRIG, HDL, CHOLHDL, VLDL, LDLCALC Lab Results  Component Value Date   TSH 0.635 07/31/2016    Therapeutic Level Labs: No results found for: LITHIUM No results found for: VALPROATE No components found for:  CBMZ  Current Medications: Current Outpatient Medications  Medication Sig Dispense Refill  . ALPRAZolam (XANAX) 0.5 MG tablet TAKE 1 TABLET BY MOUTH THREE TIMES DAILY AS NEEDED FOR  SLEEP  OR  ANXIETY 90 tablet 2  . escitalopram (LEXAPRO) 20 MG tablet Take 1 tablet (20 mg total) by mouth daily. 90 tablet 2  . fluticasone (FLONASE) 50 MCG/ACT nasal spray Place 1 spray into both nostrils daily for 14 days. 16 g 0   . omeprazole (PRILOSEC) 20 MG capsule Take 1 capsule (20 mg total) by mouth daily. 30 capsule 0   No current facility-administered medications for this visit.     Musculoskeletal: Strength & Muscle Tone: within normal limits Gait & Station: normal Patient leans: N/A  Psychiatric Specialty Exam: Review of Systems  Psychiatric/Behavioral: The patient is nervous/anxious.   All other systems reviewed and are negative.   unknown if currently breastfeeding.There is no height or weight on file to calculate BMI.  General Appearance: Casual and Fairly Groomed  Eye Contact:  Good  Speech:  Clear and Coherent  Volume:  Normal  Mood:  Anxious  Affect:  Appropriate and Congruent  Thought Process:  Goal Directed  Orientation:  Full (Time, Place, and Person)  Thought Content: WDL   Suicidal Thoughts:  No  Homicidal Thoughts:  No  Memory:  Immediate;   Good Recent;   Good Remote;   Good  Judgement:  Good  Insight:  Fair  Psychomotor Activity:  Normal  Concentration:  Concentration: Good and Attention Span: Good  Recall:  Good  Fund of Knowledge: Good  Language: Good  Akathisia:  No  Handed:  Right  AIMS (if indicated): not done  Assets:  Communication Skills Desire for Improvement Physical Health Resilience Social Support Talents/Skills  ADL's:  Intact  Cognition: WNL  Sleep:  Fair   Screenings: GAD-7     Office Visit from 04/19/2016 in [place] Visit from 02/28/2016 in [place]  Total GAD-7 Score  20  19    PHQ2-9     Office Visit from 02/10/2018 in [place] Visit from 10/03/2017 in [place] Office Visit from 07/18/2017 in [place] Office Visit from 07/10/2017 in [place] Visit from 04/19/2016 in [place]  PHQ-2 Total Score  0  0  0  0  4  PHQ-9 Total Score  --  --  --  --  15        Assessment and Plan: This patient is a 24 year old female with a history of anxiety and depression.  She seems to get very caught up in relationships that do not always work out and then she is quite disappointed.  She asked to get into therapy and I think this is a good idea.  The medications have also been helpful so she will continue Lexapro 20 mg daily for depression and Xanax 0.5 mg up to 3 times daily as needed for anxiety.  She will return to see me in 3 months   Levonne Spiller, MD 09/08/2019, 2:29 PM

## 2019-09-18 ENCOUNTER — Telehealth: Payer: Medicaid Other

## 2019-09-21 ENCOUNTER — Other Ambulatory Visit: Payer: Self-pay

## 2019-09-21 ENCOUNTER — Ambulatory Visit
Admission: EM | Admit: 2019-09-21 | Discharge: 2019-09-21 | Disposition: A | Discharge status: Home / Self Care | Payer: Managed Care, Other (non HMO) | Attending: Emergency Medicine | Admitting: Emergency Medicine

## 2019-09-21 DIAGNOSIS — G44209 Tension-type headache, unspecified, not intractable: Secondary | ICD-10-CM | POA: Diagnosis not present

## 2019-09-21 MED ORDER — GABAPENTIN 100 MG PO CAPS: 100.0000 mg | ORAL_CAPSULE | Freq: Three times a day (TID) | ORAL | 0 refills | Status: DC

## 2019-09-21 NOTE — ED Triage Notes (Signed)
Pt presents with c/o neck and head pain for past 10 days. Pt seen last week for same and took muscle relaxer's with no relief also no relief with tylenol and ibuprofen . Pt also has c/o nausea

## 2019-09-21 NOTE — ED Provider Notes (Signed)
[facility]    CSN: [id number] Arrival date & time: 09/21/19  1627      History   Chief Complaint Chief Complaint  Patient presents with  . Headache    HPI Kayla Sanchez is a 24 y.o. female.   Who presented to the urgent care with a complaint of headache for more than 10 days.  She localizes her pain to to her neck and occipital head.  She describes the pain as constant and intense in character.  She has tried OTC ibuprofen and baclofen without relief.  She reports similar symptoms in the past that improved with ibuprofen.  She does not described this as the worst headache of her life.  She denies fever, chills, nausea, vomiting, aura, rhinorrhea, watery eyes, chest pain, SOB, abdominal pain, weakness, numbness or tingling.    The history is provided by the patient. No language interpreter was used.  Headache   Past Medical History:  Diagnosis Date  . Anxiety   . Depression     Patient Active Problem List   Diagnosis Date Noted  . Depression 12/08/2012  . GAD (generalized anxiety disorder) 12/08/2012    Past Surgical History:  Procedure Laterality Date  . WISDOM TOOTH EXTRACTION      OB History    Gravida  1   Para      Term      Preterm      AB      Living        SAB      TAB      Ectopic      Multiple      Live Births               Home Medications    Prior to Admission medications   Medication Sig Start Date End Date Taking? Authorizing Provider  ALPRAZolam Prudy Feeler) 0.5 MG tablet TAKE 1 TABLET BY MOUTH THREE TIMES DAILY AS NEEDED FOR  SLEEP  OR  ANXIETY 09/08/19   Myrlene Broker, MD  escitalopram (LEXAPRO) 20 MG tablet Take 1 tablet (20 mg total) by mouth daily. 09/08/19   Myrlene Broker, MD  fluticasone (FLONASE) 50 MCG/ACT nasal spray Place 1 spray into both nostrils daily for 14 days. 07/28/19 08/11/19  Tehilla Coffel, Zachery Dakins, FNP  gabapentin (NEURONTIN) 100 MG capsule Take 1 capsule (100 mg total) by mouth 3 (three)  times daily. 09/21/19   Brianah Hopson, Zachery Dakins, FNP  omeprazole (PRILOSEC) 20 MG capsule Take 1 capsule (20 mg total) by mouth daily. 03/11/19   Rennis Harding, PA-C    Family History Family History  Problem Relation Age of Onset  . Depression Mother   . Anxiety disorder Mother   . Drug abuse Father   . Anxiety disorder Maternal Aunt   . Alcohol abuse Maternal Grandfather   . Anxiety disorder Maternal Grandfather     Social History Social History   Tobacco Use  . Smoking status: Current Every Day Smoker    Packs/day: 1.00    Years: 4.00    Pack years: 4.00    Types: Cigarettes    Last attempt to quit: 06/29/2016    Years since quitting: 3.2  . Smokeless tobacco: Never Used  Substance Use Topics  . Alcohol use: Yes    Comment: 3 beers 1 x per week  . Drug use: No    Comment: 05-21-2016 per pt 2 yrs ago     Allergies   Ciprofloxacin and Penicillins  Review of Systems Review of Systems  Constitutional: Negative.   Respiratory: Negative.   Cardiovascular: Negative.   Neurological: Positive for headaches.  All other systems reviewed and are negative.    Physical Exam Triage Vital Signs ED Triage Vitals  Enc Vitals Group     BP 09/21/19 1640 (!) 144/87     Pulse Rate 09/21/19 1640 71     Resp 09/21/19 1640 18     Temp 09/21/19 1640 98.4 F (36.9 C)     Temp src --      SpO2 09/21/19 1640 97 %     Weight --      Height --      Head Circumference --      Peak Flow --      Pain Score 09/21/19 1638 8     Pain Loc --      Pain Edu? --      Excl. in [place]? --    No data found.  Updated Vital Signs BP (!) 144/87   Pulse 71   Temp 98.4 F (36.9 C)   Resp 18   LMP  (LMP Unknown)   SpO2 97%   Visual Acuity Right Eye Distance:   Left Eye Distance:   Bilateral Distance:    Right Eye Near:   Left Eye Near:    Bilateral Near:     Physical Exam Vitals and nursing note reviewed.  Constitutional:      General: She is not in acute distress.    Appearance:  She is well-developed and normal weight. She is not ill-appearing or toxic-appearing.  HENT:     Head: Normocephalic and atraumatic.  Cardiovascular:     Rate and Rhythm: Normal rate and regular rhythm.     Pulses: Normal pulses.     Heart sounds: Normal heart sounds. No murmur.  Pulmonary:     Effort: Pulmonary effort is normal. No respiratory distress.     Breath sounds: Normal breath sounds. No stridor. No wheezing, rhonchi or rales.  Musculoskeletal:     Cervical back: Normal range of motion. No rigidity.  Neurological:     General: No focal deficit present.     Mental Status: She is alert and oriented to person, place, and time.     Cranial Nerves: No cranial nerve deficit.     Sensory: Sensation is intact. No sensory deficit.     Motor: Motor function is intact. No weakness.     Coordination: Coordination is intact. Coordination normal.     Gait: Gait is intact. Gait normal.     Deep Tendon Reflexes: Reflexes normal.     Reflex Scores:      Patellar reflexes are 2+ on the right side and 2+ on the left side. Psychiatric:        Mood and Affect: Mood normal.        Behavior: Behavior normal.      UC Treatments / Results  Labs (all labs ordered are listed, but only abnormal results are displayed) Labs Reviewed - No data to display  EKG   Radiology No results found.  Procedures Procedures (including critical care time)  Medications Ordered in UC Medications - No data to display  Initial Impression / Assessment and Plan / UC Course  I have reviewed the triage vital signs and the nursing notes.  Pertinent labs & imaging results that were available during my care of the patient were reviewed by me and considered in my medical decision making (  see chart for details).     Patient stable at discharge Advised patient to continue to take ibuprofen as prescribed Gabapentin was prescribed Return for worsening symptoms Follow-up with primary care   Final Clinical  Impressions(s) / UC Diagnoses   Final diagnoses:  Tension headache     Discharge Instructions     Take medication as prescribed Take ibuprofen as prescribed Follow-up with primary care  Return for worsening of symptoms    ED Prescriptions    Medication Sig Dispense Auth. Provider   gabapentin (NEURONTIN) 100 MG capsule Take 1 capsule (100 mg total) by mouth 3 (three) times daily. 30 capsule Reshawn Ostlund, Zachery Dakins, FNP     PDMP not reviewed this encounter.   Durward Parcel, FNP 09/21/19 1714

## 2019-09-21 NOTE — Discharge Instructions (Addendum)
Take medication as prescribed Continue to use ibuprofen as prescribed Follow-up with primary care  Return for worsening of symptoms

## 2019-10-05 ENCOUNTER — Encounter: Payer: Self-pay | Admitting: Physician Assistant

## 2019-10-05 ENCOUNTER — Ambulatory Visit: Payer: Managed Care, Other (non HMO) | Admitting: Physician Assistant

## 2019-10-05 ENCOUNTER — Encounter: Payer: Self-pay | Admitting: Emergency Medicine

## 2019-10-05 ENCOUNTER — Other Ambulatory Visit: Payer: Self-pay

## 2019-10-05 VITALS — BP 140/102 | HR 94 | Temp 99.0°F | Resp 16 | Ht 62.0 in | Wt 146.0 lb

## 2019-10-05 DIAGNOSIS — F324 Major depressive disorder, single episode, in partial remission: Secondary | ICD-10-CM

## 2019-10-05 DIAGNOSIS — K219 Gastro-esophageal reflux disease without esophagitis: Secondary | ICD-10-CM | POA: Diagnosis not present

## 2019-10-05 DIAGNOSIS — Z6826 Body mass index (BMI) 26.0-26.9, adult: Secondary | ICD-10-CM

## 2019-10-05 DIAGNOSIS — F17200 Nicotine dependence, unspecified, uncomplicated: Secondary | ICD-10-CM

## 2019-10-05 DIAGNOSIS — F411 Generalized anxiety disorder: Secondary | ICD-10-CM

## 2019-10-05 DIAGNOSIS — I10 Essential (primary) hypertension: Secondary | ICD-10-CM

## 2019-10-05 MED ORDER — OMEPRAZOLE 20 MG PO CPDR: 20.0000 mg | DELAYED_RELEASE_CAPSULE | Freq: Every day | ORAL | 3 refills | Status: DC

## 2019-10-05 NOTE — Patient Instructions (Signed)
Please keep well-hydrated and get plenty of rest. Make sure to continue your low-salt diet (see below). Increase aerobic exercise over the next few weeks, working your way up to a goal of 150 minutes per week.   Please consider letting us start a Nicoderm patch to help with smoking cessation.   Take the lisinopril daily as directed. Get a BP cuff to check BP at home every other day over the next 2 weeks.  Write these numbers down so we can see how BP is responding.  Take the omeprazole daily over the next 2 weeks.  Avoid late-night eating and heavy foods.   DASH Eating Plan DASH stands for "Dietary Approaches to Stop Hypertension." The DASH eating plan is a healthy eating plan that has been shown to reduce high blood pressure (hypertension). It may also reduce your risk for type 2 diabetes, heart disease, and stroke. The DASH eating plan may also help with weight loss. What are tips for following this plan?  General guidelines  Avoid eating more than 2,300 mg (milligrams) of salt (sodium) a day. If you have hypertension, you may need to reduce your sodium intake to 1,500 mg a day.  Limit alcohol intake to no more than 1 drink a day for nonpregnant women and 2 drinks a day for men. One drink equals 12 oz of beer, 5 oz of wine, or 1 oz of hard liquor.  Work with your health care provider to maintain a healthy body weight or to lose weight. Ask what an ideal weight is for you.  Get at least 30 minutes of exercise that causes your heart to beat faster (aerobic exercise) most days of the week. Activities may include walking, swimming, or biking.  Work with your health care provider or diet and nutrition specialist (dietitian) to adjust your eating plan to your individual calorie needs. Reading food labels   Check food labels for the amount of sodium per serving. Choose foods with less than 5 percent of the Daily Value of sodium. Generally, foods with less than 300 mg of sodium per serving  fit into this eating plan.  To find whole grains, look for the word "whole" as the first word in the ingredient list. Shopping  Buy products labeled as "low-sodium" or "no salt added."  Buy fresh foods. Avoid canned foods and premade or frozen meals. Cooking  Avoid adding salt when cooking. Use salt-free seasonings or herbs instead of table salt or sea salt. Check with your health care provider or pharmacist before using salt substitutes.  Do not fry foods. Cook foods using healthy methods such as baking, boiling, grilling, and broiling instead.  Cook with heart-healthy oils, such as olive, canola, soybean, or sunflower oil. Meal planning  Eat a balanced diet that includes: ? 5 or more servings of fruits and vegetables each day. At each meal, try to fill half of your plate with fruits and vegetables. ? Up to 6-8 servings of whole grains each day. ? Less than 6 oz of lean meat, poultry, or fish each day. A 3-oz serving of meat is about the same size as a deck of cards. One egg equals 1 oz. ? 2 servings of low-fat dairy each day. ? A serving of nuts, seeds, or beans 5 times each week. ? Heart-healthy fats. Healthy fats called Omega-3 fatty acids are found in foods such as flaxseeds and coldwater fish, like sardines, salmon, and mackerel.  Limit how much you eat of the following: ? Canned or  prepackaged foods. ? Food that is high in trans fat, such as fried foods. ? Food that is high in saturated fat, such as fatty meat. ? Sweets, desserts, sugary drinks, and other foods with added sugar. ? Full-fat dairy products.  Do not salt foods before eating.  Try to eat at least 2 vegetarian meals each week.  Eat more home-cooked food and less restaurant, buffet, and fast food.  When eating at a restaurant, ask that your food be prepared with less salt or no salt, if possible. What foods are recommended? The items listed may not be a complete list. Talk with your dietitian about what  dietary choices are best for you. Grains Whole-grain or whole-wheat bread. Whole-grain or whole-wheat pasta. Brown rice. Orpah Cobb. Bulgur. Whole-grain and low-sodium cereals. Pita bread. Low-fat, low-sodium crackers. Whole-wheat flour tortillas. Vegetables Fresh or frozen vegetables (raw, steamed, roasted, or grilled). Low-sodium or reduced-sodium tomato and vegetable juice. Low-sodium or reduced-sodium tomato sauce and tomato paste. Low-sodium or reduced-sodium canned vegetables. Fruits All fresh, dried, or frozen fruit. Canned fruit in natural juice (without added sugar). Meat and other protein foods Skinless chicken or [place]. Ground chicken or [place]. Pork with fat trimmed off. Fish and seafood. Egg whites. Dried beans, peas, or lentils. Unsalted nuts, nut butters, and seeds. Unsalted canned beans. Lean cuts of beef with fat trimmed off. Low-sodium, lean deli meat. Dairy Low-fat (1%) or fat-free (skim) milk. Fat-free, low-fat, or reduced-fat cheeses. Nonfat, low-sodium ricotta or cottage cheese. Low-fat or nonfat yogurt. Low-fat, low-sodium cheese. Fats and oils Soft margarine without trans fats. Vegetable oil. Low-fat, reduced-fat, or light mayonnaise and salad dressings (reduced-sodium). Canola, safflower, olive, soybean, and sunflower oils. Avocado. Seasoning and other foods Herbs. Spices. Seasoning mixes without salt. Unsalted popcorn and pretzels. Fat-free sweets. What foods are not recommended? The items listed may not be a complete list. Talk with your dietitian about what dietary choices are best for you. Grains Baked goods made with fat, such as croissants, muffins, or some breads. Dry pasta or rice meal packs. Vegetables Creamed or fried vegetables. Vegetables in a cheese sauce. Regular canned vegetables (not low-sodium or reduced-sodium). Regular canned tomato sauce and paste (not low-sodium or reduced-sodium). Regular tomato and vegetable juice (not low-sodium or  reduced-sodium). Rosita Fire. Olives. Fruits Canned fruit in a light or heavy syrup. Fried fruit. Fruit in cream or butter sauce. Meat and other protein foods Fatty cuts of meat. Ribs. Fried meat. Tomasa Blase. Sausage. Bologna and other processed lunch meats. Salami. Fatback. Hotdogs. Bratwurst. Salted nuts and seeds. Canned beans with added salt. Canned or smoked fish. Whole eggs or egg yolks. Chicken or [place] with skin. Dairy Whole or 2% milk, cream, and half-and-half. Whole or full-fat cream cheese. Whole-fat or sweetened yogurt. Full-fat cheese. Nondairy creamers. Whipped toppings. Processed cheese and cheese spreads. Fats and oils Butter. Stick margarine. Lard. Shortening. Ghee. Bacon fat. Tropical oils, such as coconut, palm kernel, or palm oil. Seasoning and other foods Salted popcorn and pretzels. Onion salt, garlic salt, seasoned salt, table salt, and sea salt. Worcestershire sauce. Tartar sauce. Barbecue sauce. Teriyaki sauce. Soy sauce, including reduced-sodium. Steak sauce. Canned and packaged gravies. Fish sauce. Oyster sauce. Cocktail sauce. Horseradish that you find on the shelf. Ketchup. Mustard. Meat flavorings and tenderizers. Bouillon cubes. Hot sauce and Tabasco sauce. Premade or packaged marinades. Premade or packaged taco seasonings. Relishes. Regular salad dressings. Where to find more information:  National Heart, Lung, and Blood Institute: PopSteam.is  American Heart Association: www.heart.org Summary  The DASH  eating plan is a healthy eating plan that has been shown to reduce high blood pressure (hypertension). It may also reduce your risk for type 2 diabetes, heart disease, and stroke.  With the DASH eating plan, you should limit salt (sodium) intake to 2,300 mg a day. If you have hypertension, you may need to reduce your sodium intake to 1,500 mg a day.  When on the DASH eating plan, aim to eat more fresh fruits and vegetables, whole grains, lean proteins, low-fat  dairy, and heart-healthy fats.  Work with your health care provider or diet and nutrition specialist (dietitian) to adjust your eating plan to your individual calorie needs. This information is not intended to replace advice given to you by your health care provider. Make sure you discuss any questions you have with your health care provider. Document Revised: 06/28/2017 Document Reviewed: 07/09/2016 Elsevier Patient Education  2020 [place] with Quitting Smoking  Quitting smoking is a physical and mental challenge. You will face cravings, withdrawal symptoms, and temptation. Before quitting, work with your health care provider to make a plan that can help you cope. Preparation can help you quit and keep you from giving in. How can I cope with cravings? Cravings usually last for 5-10 minutes. If you get through it, the craving will pass. Consider taking the following actions to help you cope with cravings:  Keep your mouth busy: ? Chew sugar-free gum. ? Suck on hard candies or a straw. ? Brush your teeth.  Keep your hands and body busy: ? Immediately change to a different activity when you feel a craving. ? Squeeze or play with a ball. ? Do an activity or a hobby, like making bead jewelry, practicing needlepoint, or working with wood. ? Mix up your normal routine. ? Take a short exercise break. Go for a quick walk or run up and down stairs. ? Spend time in public places where smoking is not allowed.  Focus on doing something kind or helpful for someone else.  Call a friend or family member to talk during a craving.  Join a support group.  Call a quit line, such as 1-800-QUIT-NOW.  Talk with your health care provider about medicines that might help you cope with cravings and make quitting easier for you. How can I deal with withdrawal symptoms? Your body may experience negative effects as it tries to get used to not having nicotine in the system. These effects are  called withdrawal symptoms. They may include:  Feeling hungrier than normal.  Trouble concentrating.  Irritability.  Trouble sleeping.  Feeling depressed.  Restlessness and agitation.  Craving a cigarette. To manage withdrawal symptoms:  Avoid places, people, and activities that trigger your cravings.  Remember why you want to quit.  Get plenty of sleep.  Avoid coffee and other caffeinated drinks. These may worsen some of your symptoms. How can I handle social situations? Social situations can be difficult when you are quitting smoking, especially in the first few weeks. To manage this, you can:  Avoid parties, bars, and other social situations where people might be smoking.  Avoid alcohol.  Leave right away if you have the urge to smoke.  Explain to your family and friends that you are quitting smoking. Ask for understanding and support.  Plan activities with friends or family where smoking is not an option. What are some ways I can cope with stress? Wanting to smoke may cause stress, and stress can make you want to  smoke. Find ways to manage your stress. Relaxation techniques can help. For example:  Breathe slowly and deeply, in through your nose and out through your mouth.  Listen to soothing, relaxing music.  Talk with a family member or friend about your stress.  Light a candle.  Soak in a bath or take a shower.  Think about a peaceful place. What are some ways I can prevent weight gain? Be aware that many people gain weight after they quit smoking. However, not everyone does. To keep from gaining weight, have a plan in place before you quit and stick to the plan after you quit. Your plan should include:  Having healthy snacks. When you have a craving, it may help to: ? Eat plain popcorn, crunchy carrots, celery, or other cut vegetables. ? Chew sugar-free gum.  Changing how you eat: ? Eat small portion sizes at meals. ? Eat 4-6 small meals throughout  the day instead of 1-2 large meals a day. ? Be mindful when you eat. Do not watch television or do other things that might distract you as you eat.  Exercising regularly: ? Make time to exercise each day. If you do not have time for a long workout, do short bouts of exercise for 5-10 minutes several times a day. ? Do some form of strengthening exercise, like weight lifting, and some form of aerobic exercise, like running or swimming.  Drinking plenty of water or other low-calorie or no-calorie drinks. Drink 6-8 glasses of water daily, or as much as instructed by your health care provider. Summary  Quitting smoking is a physical and mental challenge. You will face cravings, withdrawal symptoms, and temptation to smoke again. Preparation can help you as you go through these challenges.  You can cope with cravings by keeping your mouth busy (such as by chewing gum), keeping your body and hands busy, and making calls to family, friends, or a helpline for people who want to quit smoking.  You can cope with withdrawal symptoms by avoiding places where people smoke, avoiding drinks with caffeine, and getting plenty of rest.  Ask your health care provider about the different ways to prevent weight gain, avoid stress, and handle social situations. This information is not intended to replace advice given to you by your health care provider. Make sure you discuss any questions you have with your health care provider. Document Revised: 06/28/2017 Document Reviewed: 07/13/2016 Elsevier Patient Education  2020 [employer].

## 2019-10-05 NOTE — Progress Notes (Signed)
Patient presents to clinic today to establish care.  Acute Concerns: Patient endorses 4 weeks of intermittent posterior headaches. Has been seen multiple times in ER in urgent care, diagnosed with tension headache which she has been treated.  Blood pressure also consistently elevated at these visits.  Patient concerned that headaches are stemming from her blood pressure elevation.  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  Patient does drink few alcoholic beverages per week.  No regular exercise.  Did have high sodium diet but has been working on this over the past couple weeks.  Also smokes half a pack per day. Patient denies chest pain, palpitations, lightheadedness, dizziness, vision changes. Body mass index is 26.7 kg/m.  Chronic Issues: Anxiety/Depression --patient currently on a regimen of Lexapro and alprazolam.  Followed by psychiatry.  GERD --patient with prior history of GERD.  States she has been doing well but over the past couple months notes almost daily heartburn symptoms.  Denies abdominal pain but notes occasional nausea.  Denies melena hematochezia or tenesmus.  Has not taken anything for symptoms.  Health Maintenance: Immunizations -- Flu shot up-to-date.  Unsure of last tetanus.  Will obtain records PAP --patient endorses last Pap October 2020.  Will need records.  Past Medical History:  Diagnosis Date  . Anxiety   . Depression   . GERD (gastroesophageal reflux disease)     Past Surgical History:  Procedure Laterality Date  . WISDOM TOOTH EXTRACTION      Current Outpatient Medications on File Prior to Visit  Medication Sig Dispense Refill  . ALPRAZolam (XANAX) 0.5 MG tablet TAKE 1 TABLET BY MOUTH THREE TIMES DAILY AS NEEDED FOR  SLEEP  OR  ANXIETY 90 tablet 2  . escitalopram (LEXAPRO) 20 MG tablet Take 1 tablet (20 mg total) by mouth daily. 90 tablet 2  . lisinopril (ZESTRIL) 10 MG tablet Take by mouth.      No current facility-administered medications on file prior to visit.    Allergies  Allergen Reactions  . Ciprofloxacin Other (See Comments)    Tendonitis   . Penicillins Hives    Has patient had a PCN reaction causing immediate rash, facial/tongue/throat swelling, SOB or lightheadedness with hypotension: No Has patient had a PCN reaction causing severe rash involving mucus membranes or skin necrosis: No Has patient had a PCN reaction that required hospitalization No Has patient had a PCN reaction occurring within the last 10 years: No If all of the above answers are "NO", then may proceed with Cephalosporin use.   . Nitrofurantoin Itching    Family History  Problem Relation Age of Onset  . Depression Mother   . Anxiety disorder Mother   . Drug abuse Father   . Anxiety disorder Maternal Aunt   . Alcohol abuse Maternal Grandfather   . Anxiety disorder Maternal Grandfather     Social History   Socioeconomic History  . Marital status: Single    Spouse name: Not on file  . Number of children: Not on file  . Years of education: Not on file  . Highest education level: Not on file  Occupational History  . Not on file  Tobacco Use  . Smoking status: Current Every Day Smoker    Packs/day: 0.50    Years: 4.00    Pack years: 2.00    Types: Cigarettes    Last attempt to quit: 06/29/2016    Years since quitting: 3.2  .  Smokeless tobacco: Never Used  Substance and Sexual Activity  . Alcohol use: Yes    Comment: 3 beers 1 x per week  . Drug use: No    Comment: 05-21-2016 per pt 2 yrs ago  . Sexual activity: Yes    Birth control/protection: None  Other Topics Concern  . Not on file  Social History Narrative  . Not on file   Social Determinants of Health   Financial Resource Strain:   . Difficulty of Paying Living Expenses: Not on file  Food Insecurity:   . Worried About Programme researcher, broadcasting/film/video in the Last Year: Not on file  . Ran Out of Food in the Last Year: Not on  file  Transportation Needs:   . Lack of Transportation (Medical): Not on file  . Lack of Transportation (Non-Medical): Not on file  Physical Activity:   . Days of Exercise per Week: Not on file  . Minutes of Exercise per Session: Not on file  Stress:   . Feeling of Stress : Not on file  Social Connections:   . Frequency of Communication with Friends and Family: Not on file  . Frequency of Social Gatherings with Friends and Family: Not on file  . Attends Religious Services: Not on file  . Active Member of Clubs or Organizations: Not on file  . Attends Banker Meetings: Not on file  . Marital Status: Not on file  Intimate Partner Violence:   . Fear of Current or Ex-Partner: Not on file  . Emotionally Abused: Not on file  . Physically Abused: Not on file  . Sexually Abused: Not on file   Review of Systems  Constitutional: Negative for fever and weight loss.  HENT: Negative for ear discharge, ear pain, hearing loss and tinnitus.   Eyes: Negative for blurred vision, double vision, photophobia and pain.  Respiratory: Negative for cough and shortness of breath.   Cardiovascular: Negative for chest pain and palpitations.  Gastrointestinal: Positive for heartburn and nausea. Negative for abdominal pain, blood in stool, constipation, diarrhea, melena and vomiting.  Genitourinary: Negative for dysuria, flank pain, frequency, hematuria and urgency.  Musculoskeletal: Negative for falls.  Neurological: Positive for headaches. Negative for dizziness and loss of consciousness.  Endo/Heme/Allergies: Negative for environmental allergies.  Psychiatric/Behavioral: Negative for depression, hallucinations, substance abuse and suicidal ideas. The patient is not nervous/anxious and does not have insomnia.     Ht 5\' 2"  (1.575 m)   Wt 146 lb (66.2 kg)   LMP  (LMP Unknown)   BMI 26.70 kg/m   Physical Exam Vitals reviewed.  Constitutional:      Appearance: Normal appearance.  HENT:      Head: Normocephalic and atraumatic.     Right Ear: Tympanic membrane normal.     Left Ear: Tympanic membrane normal.     Mouth/Throat:     Mouth: Mucous membranes are moist.  Eyes:     Conjunctiva/sclera: Conjunctivae normal.     Pupils: Pupils are equal, round, and reactive to light.  Neck:     Thyroid: No thyroid mass, thyromegaly or thyroid tenderness.  Cardiovascular:     Rate and Rhythm: Normal rate and regular rhythm.     Pulses: Normal pulses.     Heart sounds: Normal heart sounds.  Pulmonary:     Effort: Pulmonary effort is normal.     Breath sounds: Normal breath sounds.  Musculoskeletal:     Cervical back: Neck supple.  Neurological:     Mental  Status: She is alert.     Recent Results (from the past 2160 hour(s))  Novel Coronavirus, NAA ([facility])     Status: None   Collection Time: 08/18/19  3:10 PM   Specimen: Nasopharyngeal(NP) swabs in vial transport medium   NASOPHARYNGE  TESTING  Result Value Ref Range   SARS-CoV-2, NAA Not Detected Not Detected    Comment: This nucleic acid amplification test was developed and its performance characteristics determined by [employer]. Nucleic acid amplification tests include RT-PCR and TMA. This test has not been FDA cleared or approved. This test has been authorized by FDA under an Emergency Use Authorization (EUA). This test is only authorized for the duration of time the declaration that circumstances exist justifying the authorization of the emergency use of in vitro diagnostic tests for detection of SARS-CoV-2 virus and/or diagnosis of COVID-19 infection under section 564(b)(1) of the Act, 21 U.S.C. 315QMG-8(Q) (1), unless the authorization is terminated or revoked sooner. When diagnostic testing is negative, the possibility of a false negative result should be considered in the context of a patient's recent exposures and the presence of clinical signs and symptoms consistent with COVID-19. An individual  without symptoms of COVID-19 and who is not shedding SARS-CoV-2 virus wo uld expect to have a negative (not detected) result in this assay.   POCT urinalysis dipstick     Status: Abnormal   Collection Time: 08/20/19  3:17 PM  Result Value Ref Range   Color, UA yellow yellow   Clarity, UA clear clear   Glucose, UA negative negative mg/dL   Bilirubin, UA small (A) negative   Ketones, POC UA negative negative mg/dL   Spec Grav, UA 7.619 5.093 - 1.025   Blood, UA trace-intact (A) negative   pH, UA 7.0 5.0 - 8.0   Protein Ur, POC negative negative mg/dL   Urobilinogen, UA 1.0 0.2 or 1.0 E.U./dL   Nitrite, UA Negative Negative   Leukocytes, UA Negative Negative  Urine Culture     Status: Abnormal   Collection Time: 08/20/19  3:38 PM   Specimen: Urine, Clean Catch  Result Value Ref Range   Specimen Description      URINE, CLEAN CATCH Performed at [facility], [address] [place], [place] [address]    Special Requests      NONE Performed at [facility], [address] [place], [place] [address]    Culture MULTIPLE SPECIES PRESENT, SUGGEST RECOLLECTION (A)    Report Status 08/22/2019 FINAL   Cervicovaginal ancillary only     Status: Abnormal   Collection Time: 08/25/19 12:00 AM  Result Value Ref Range   Bacterial vaginitis Negative for Bacterial Vaginitis Microorganisms     Comment: Normal Reference Range - Negative   Candida vaginitis **POSITIVE for Candida species** (A)     Comment: Normal Reference Range - Negative   Trichomonas Negative     Comment: Normal Reference Range - Negative   Chlamydia Negative     Comment: Normal Reference Range - Negative   Neisseria Gonorrhea **POSITIVE** (A)     Comment: Normal Reference Range - Negative    Assessment/Plan: 1. Hypertension, unspecified type Stage II HTN. Multiple previous elevations on file and per patient report. She is overweight, does have intake of alcohol although not excessive, is a smoker, no regular exercise and  poor diet. Will start DASH diet. Have her continue to limit caffeine. Decrease alcohol consumption to a couple of drinks per week.  Start aerobic exercise, beginning with walking  for 15 minutes or so a few times per week and building up to a goal of 150 minutes per week. Will have her start Lisinopril 10 mg given by ER physician. Take daily as directed. She has been encouraged to get a BP cuff to check home BP readings and record. Follow-up 2 weeks for reassessment of BP.   2. Tobacco use disorder Discussed smoking cessation. States she feels she needs to quit but is not sure if she is ready. Has stopped previously on her own during pregnancy and feels she can do it on her own again. Discussed potential for Nicoderm. Would want to avoid Bupropion and Chantix if possible due to her history of anxiety/depression. She will give some thought to the Nicoderm and let us know.  3. Gastroesophageal reflux disease without esophagitis Daily symptoms. No alarm signs/symptoms present. Start 2 week course of omeprazole 20 mg daily. GERD diet reviewed. Handout given. Follow-up scheduled.   4. Major depressive disorder in partial remission, unspecified whether recurrent ([place]) 5. GAD (generalized anxiety disorder) Continue management per Psychiatry.  6. BMI 26.0-26.9,adult Dietary and exercise recommendations reviewed. Will check labs at her follow-up visit.   This visit occurred during the SARS-CoV-2 public health emergency.  Safety protocols were in place, including screening questions prior to the visit, additional usage of staff PPE, and extensive cleaning of exam room while observing appropriate contact time as indicated for disinfecting solutions.     Leeanne Rio, PA-C

## 2019-10-06 ENCOUNTER — Telehealth: Payer: Self-pay | Admitting: Physician Assistant

## 2019-10-06 DIAGNOSIS — I10 Essential (primary) hypertension: Secondary | ICD-10-CM

## 2019-10-06 NOTE — Telephone Encounter (Signed)
Pt called in stating that she thinks she is having an allergic reaction to the Lisinopril, she states this was given to her at the ER. She is having a tingling sensation in her face and neck that started about 3 hrs again and is getting worse.  She is not having any shortness of breat or difficulty swallowing.  Pt can be reached at the home #

## 2019-10-06 NOTE — Telephone Encounter (Signed)
Spoke with patient about stopping the Lisinopril. Start taking Benadryl OTC every 4-6 hrs to help relieve symptoms We will call patient tomorrow to see how she is doing Will contact her at [phone] x [phone] work number. Ok to leave message on VM

## 2019-10-06 NOTE — Telephone Encounter (Signed)
Today started around 12p started having some tingling sensation of skin of chest and radiating to her face (lips,cheek and eye) This is her 2nd dose today.  Took the Lisinopril at 9 am this morning. Today BP 103/79  Denies any headache

## 2019-10-06 NOTE — Telephone Encounter (Signed)
No more lisinopril. Start Benadryl OTC every 4-6 hours. If not improving or anything worsens, needs ER assessment We will need to switch her medication once things calm down.

## 2019-10-06 NOTE — Telephone Encounter (Signed)
Called patient but unable to leave a message due to VM being full.

## 2019-10-07 NOTE — Telephone Encounter (Signed)
Called patient today. She states the symptoms did go away and then came back but not is fully resolved.

## 2019-10-08 MED ORDER — HYDROCHLOROTHIAZIDE 12.5 MG PO TABS: 12.5000 mg | ORAL_TABLET | Freq: Every day | ORAL | 1 refills | Status: DC

## 2019-10-08 NOTE — Telephone Encounter (Signed)
Called patient at work number and left a detailed message on VM. Rx for HCTZ 12.5 mg sent to the [facility] pharmacy.  Patient has pending appoint on 10/19/19 for follow up.

## 2019-10-08 NOTE — Telephone Encounter (Signed)
Received clarification regarding last message -- was "now" fully resolved. Would have her start HCTZ 12.5 mg instead for her hypertension. Continue working on lifestyle changes. Follow-up in office in 2 weeks for reassessment.

## 2019-10-08 NOTE — Addendum Note (Signed)
Addended by: Con Memos on: 10/08/2019 11:27 AM   Modules accepted: Orders

## 2019-10-12 ENCOUNTER — Ambulatory Visit (INDEPENDENT_AMBULATORY_CARE_PROVIDER_SITE_OTHER): Payer: Managed Care, Other (non HMO)

## 2019-10-12 ENCOUNTER — Other Ambulatory Visit (HOSPITAL_COMMUNITY)
Admission: RE | Admit: 2019-10-12 | Discharge: 2019-10-12 | Disposition: A | Discharge status: Home / Self Care | Payer: Managed Care, Other (non HMO) | Source: Ambulatory Visit | Attending: Physician Assistant | Admitting: Physician Assistant

## 2019-10-12 ENCOUNTER — Ambulatory Visit (INDEPENDENT_AMBULATORY_CARE_PROVIDER_SITE_OTHER): Payer: Managed Care, Other (non HMO) | Admitting: Physician Assistant

## 2019-10-12 ENCOUNTER — Encounter: Payer: Self-pay | Admitting: Physician Assistant

## 2019-10-12 ENCOUNTER — Other Ambulatory Visit: Payer: Self-pay

## 2019-10-12 DIAGNOSIS — Z113 Encounter for screening for infections with a predominantly sexual mode of transmission: Secondary | ICD-10-CM

## 2019-10-12 DIAGNOSIS — R300 Dysuria: Secondary | ICD-10-CM

## 2019-10-12 MED ORDER — SULFAMETHOXAZOLE-TRIMETHOPRIM 800-160 MG PO TABS: 1.0000 | ORAL_TABLET | Freq: Two times a day (BID) | ORAL | 0 refills | Status: DC

## 2019-10-12 NOTE — Progress Notes (Signed)
I have discussed the procedure for the virtual visit with the patient who has given consent to proceed with assessment and treatment.   Pt is unable to get her vitals during visit  Con Memos, CMA

## 2019-10-12 NOTE — Progress Notes (Signed)
Virtual Visit via Telephone Note  I connected with Kayla Sanchez on 10/12/19 at 11:00 AM EDT by telephone and verified that I am speaking with the correct person using two identifiers.  Location: Patient: Home Provider: [facility]   I discussed the limitations, risks, security and privacy concerns of performing an evaluation and management service by telephone and the availability of in person appointments. I also discussed with the patient that there may be a patient responsible charge related to this service. The patient expressed understanding and agreed to proceed.  History of Present Illness: Patient presents via phone today doxy platform not working , complaining of couple days of dysuria, urinary urgency and frequency.  Denies any hematuria, low back pain, nausea or vomiting.  Denies abdominal pain.  Denies any vaginal bleeding, discharge or pain.  Is sexually active, having a new female partner with last episode of intercourse 4 days ago. No protection used at that time.  Notes he is completely asymptomatic. IUD in place.    Observations/Objective: No labored breathing.  Speech is clear and coherent with logical content.  Patient is alert and oriented at baseline.   Assessment and Plan: 1. Routine screening for STI (sexually transmitted infection) We will check routine labs as noted.  Safe sex practices reviewed with patient.  She is to refrain from any sexual activity until results are in.  Lab appointment scheduled. - HIV Antibody (routine testing w rflx) - [facility] - HSV(herpes smplx)abs-1+2(IgG+IgM)-bld - Urine cytology ancillary only([place])  2. Dysuria We will start empiric treatment for UTI giving this is the most likely culprit for her symptoms.  Patient with multiple drug allergies.  Will start Bactrim twice daily x5 days.  Patient to come in for urine culture to be obtained.  Supportive measures and OTC medications reviewed.  Will alter treatment based on  culture results. - Urine Culture   Follow Up Instructions:    I discussed the assessment and treatment plan with the patient. The patient was provided an opportunity to ask questions and all were answered. The patient agreed with the plan and demonstrated an understanding of the instructions.   The patient was advised to call back or seek an in-person evaluation if the symptoms worsen or if the condition fails to improve as anticipated.  I provided 10 minutes of non-face-to-face time during this encounter.   Piedad Climes, PA-C

## 2019-10-12 NOTE — Progress Notes (Signed)
Patient presents for venipuncture. Labs released. Specimen obtained.

## 2019-10-13 ENCOUNTER — Ambulatory Visit: Payer: Managed Care, Other (non HMO)

## 2019-10-13 ENCOUNTER — Ambulatory Visit: Payer: Managed Care, Other (non HMO) | Attending: Internal Medicine

## 2019-10-13 DIAGNOSIS — Z20822 Contact with and (suspected) exposure to covid-19: Secondary | ICD-10-CM

## 2019-10-13 LAB — URINE CULTURE
MICRO NUMBER:: 10251893
SPECIMEN QUALITY:: ADEQUATE

## 2019-10-13 LAB — HIV ANTIBODY (ROUTINE TESTING W REFLEX): HIV 1&2 Ab, 4th Generation: NONREACTIVE

## 2019-10-13 LAB — RPR: RPR Ser Ql: NONREACTIVE

## 2019-10-14 ENCOUNTER — Other Ambulatory Visit: Payer: Self-pay

## 2019-10-14 LAB — NOVEL CORONAVIRUS, NAA: SARS-CoV-2, NAA: NOT DETECTED

## 2019-10-14 LAB — URINE CYTOLOGY ANCILLARY ONLY
Chlamydia: NEGATIVE
Comment: NEGATIVE
Comment: NEGATIVE
Comment: NORMAL
Neisseria Gonorrhea: NEGATIVE
Trichomonas: NEGATIVE

## 2019-10-14 LAB — HSV(HERPES SMPLX)ABS-I+II(IGG+IGM)-BLD
HSV 1 Glycoprotein G Ab, IgG: <0.91 index (ref 0.00–0.90)
HSV 2 IgG, Type Spec: <0.91 index (ref 0.00–0.90)
HSVI/II Comb IgM: <0.91 Ratio (ref 0.00–0.90)

## 2019-10-14 MED ORDER — CEFDINIR 300 MG PO CAPS: 300.0000 mg | ORAL_CAPSULE | Freq: Two times a day (BID) | ORAL | 0 refills | Status: AC

## 2019-10-16 ENCOUNTER — Other Ambulatory Visit: Payer: Self-pay | Admitting: Emergency Medicine

## 2019-10-16 DIAGNOSIS — R300 Dysuria: Secondary | ICD-10-CM

## 2019-10-19 ENCOUNTER — Ambulatory Visit: Payer: Managed Care, Other (non HMO) | Admitting: Physician Assistant

## 2019-10-19 DIAGNOSIS — Z0289 Encounter for other administrative examinations: Secondary | ICD-10-CM

## 2019-10-21 ENCOUNTER — Ambulatory Visit (HOSPITAL_COMMUNITY): Payer: 59 | Admitting: Psychiatry

## 2019-10-23 ENCOUNTER — Ambulatory Visit (INDEPENDENT_AMBULATORY_CARE_PROVIDER_SITE_OTHER): Payer: Managed Care, Other (non HMO)

## 2019-10-23 ENCOUNTER — Other Ambulatory Visit: Payer: Self-pay

## 2019-10-23 DIAGNOSIS — R300 Dysuria: Secondary | ICD-10-CM

## 2019-10-23 LAB — POCT URINALYSIS DIPSTICK
Bilirubin, UA: NEGATIVE
Blood, UA: NEGATIVE
Glucose, UA: NEGATIVE
Ketones, UA: NEGATIVE
Leukocytes, UA: NEGATIVE
Nitrite, UA: NEGATIVE
Protein, UA: NEGATIVE
Spec Grav, UA: <=1.005 — AB (ref 1.010–1.025)
Urobilinogen, UA: 0.2 E.U./dL
pH, UA: 6.5 (ref 5.0–8.0)

## 2019-10-25 LAB — URINE CULTURE
MICRO NUMBER:: 10298644
SPECIMEN QUALITY:: ADEQUATE

## 2019-11-09 ENCOUNTER — Encounter: Payer: Self-pay | Admitting: Physician Assistant

## 2019-11-09 ENCOUNTER — Other Ambulatory Visit: Payer: Self-pay

## 2019-11-09 ENCOUNTER — Telehealth (INDEPENDENT_AMBULATORY_CARE_PROVIDER_SITE_OTHER): Payer: Managed Care, Other (non HMO) | Admitting: Physician Assistant

## 2019-11-09 DIAGNOSIS — B9689 Other specified bacterial agents as the cause of diseases classified elsewhere: Secondary | ICD-10-CM

## 2019-11-09 DIAGNOSIS — J019 Acute sinusitis, unspecified: Secondary | ICD-10-CM | POA: Diagnosis not present

## 2019-11-09 MED ORDER — BENZONATATE 100 MG PO CAPS: 100.0000 mg | ORAL_CAPSULE | Freq: Three times a day (TID) | ORAL | 0 refills | Status: DC | PRN

## 2019-11-09 MED ORDER — DOXYCYCLINE HYCLATE 100 MG PO TABS: 100.0000 mg | ORAL_TABLET | Freq: Two times a day (BID) | ORAL | 0 refills | Status: DC

## 2019-11-09 NOTE — Progress Notes (Signed)
I have discussed the procedure for the virtual visit with the patient who has given consent to proceed with assessment and treatment.   Pt is unable to get her vitals during the visit  Con Memos, CMA

## 2019-11-09 NOTE — Patient Instructions (Signed)
Instructions sent to MyChart

## 2019-11-09 NOTE — Progress Notes (Signed)
Virtual Visit via Video   I connected with patient on 11/09/19 at 11:00 AM EDT by a video enabled telemedicine application and verified that I am speaking with the correct person using two identifiers.  Location patient: Home Location provider: [facility], Office Persons participating in the virtual visit: Patient, Provider, [place] (Patina Moore)  I discussed the limitations of evaluation and management by telemedicine and the availability of in person appointments. The patient expressed understanding and agreed to proceed.  Subjective:   HPI:   Patient presents via [facility] today greater than 1 week of nasal congestion with sinus pressure and sinus discomfort.  Notes symptoms were initially improving since yesterday has developed a cough productive of yellow sputum associated with chest congestion.  Denies any fever, chills, wheezing or shortness of breath.  Denies any loss of taste or smell.  Denies GI symptoms.  Denies recent travel or sick contact.  Has been keeping well hydrated.  ROS:   See pertinent positives and negatives per HPI.  Patient Active Problem List   Diagnosis Date Noted  . Depression 12/08/2012  . GAD (generalized anxiety disorder) 12/08/2012    Social History   Tobacco Use  . Smoking status: Current Every Day Smoker    Packs/day: 1.00    Years: 4.00    Pack years: 4.00    Types: Cigarettes  . Smokeless tobacco: Never Used  Substance Use Topics  . Alcohol use: Yes    Comment: 3 beers 1-2 x per week    Current Outpatient Medications:  .  ALPRAZolam (XANAX) 0.5 MG tablet, TAKE 1 TABLET BY MOUTH THREE TIMES DAILY AS NEEDED FOR  SLEEP  OR  ANXIETY, Disp: 90 tablet, Rfl: 2 .  escitalopram (LEXAPRO) 20 MG tablet, Take 1 tablet (20 mg total) by mouth daily., Disp: 90 tablet, Rfl: 2 .  levonorgestrel (LILETTA, 52 MG,) 19.5 MCG/DAY IUD IUD, 1 each by Intrauterine route once., Disp: , Rfl:  .  benzonatate (TESSALON) 100 MG capsule, Take 1 capsule (100  mg total) by mouth 3 (three) times daily as needed., Disp: 30 capsule, Rfl: 0 .  doxycycline (VIBRA-TABS) 100 MG tablet, Take 1 tablet (100 mg total) by mouth 2 (two) times daily., Disp: 14 tablet, Rfl: 0 .  hydrochlorothiazide (HYDRODIURIL) 12.5 MG tablet, Take 1 tablet (12.5 mg total) by mouth daily. (Patient not taking: Reported on 10/12/2019), Disp: 30 tablet, Rfl: 1 .  omeprazole (PRILOSEC) 20 MG capsule, Take 1 capsule (20 mg total) by mouth daily. (Patient not taking: Reported on 11/09/2019), Disp: 30 capsule, Rfl: 3  Allergies  Allergen Reactions  . Ciprofloxacin Other (See Comments)    Tendonitis   . Penicillins Hives    Has patient had a PCN reaction causing immediate rash, facial/tongue/throat swelling, SOB or lightheadedness with hypotension: No Has patient had a PCN reaction causing severe rash involving mucus membranes or skin necrosis: No Has patient had a PCN reaction that required hospitalization No Has patient had a PCN reaction occurring within the last 10 years: No If all of the above answers are "NO", then may proceed with Cephalosporin use.   . Nitrofurantoin Itching    Objective:   Breastfeeding No   Patient is well-developed, well-nourished in no acute distress.  Resting comfortably at home.  Head is normocephalic, atraumatic.  No labored breathing.  Speech is clear and coherent with logical content.  Patient is alert and oriented at baseline.   Assessment and Plan:   1. Acute bacterial sinusitis With cough and  some chest congestion.  No exposure to Covid.  Symptoms seem classic for sinusitis.  Concern for bacterial sinusitis due to double worsening of symptoms. Patient is penicillin allergic.  Will Rx doxycycline.  Rx Tessalon for cough.  Recommend patient start daily antihistamine.  Start OTC plain Mucinex twice daily.  Other supportive measures and OTC medications reviewed.  Handout sent to patient's MyChart.  Strict return precautions reviewed with  patient.    Piedad Climes, PA-C 11/09/2019

## 2019-11-30 ENCOUNTER — Encounter (HOSPITAL_COMMUNITY): Payer: Self-pay | Admitting: Psychiatry

## 2019-11-30 ENCOUNTER — Telehealth (INDEPENDENT_AMBULATORY_CARE_PROVIDER_SITE_OTHER): Payer: 59 | Admitting: Psychiatry

## 2019-11-30 ENCOUNTER — Other Ambulatory Visit: Payer: Self-pay

## 2019-11-30 DIAGNOSIS — F410 Panic disorder [episodic paroxysmal anxiety] without agoraphobia: Secondary | ICD-10-CM

## 2019-11-30 MED ORDER — ESCITALOPRAM OXALATE 20 MG PO TABS: 20.0000 mg | ORAL_TABLET | Freq: Every day | ORAL | 2 refills | Status: DC

## 2019-11-30 NOTE — Progress Notes (Addendum)
Virtual Visit via Telephone Note  I connected with Kayla Sanchez on 11/30/19 at  2:20 PM EDT by telephone and verified that I am speaking with the correct person using two identifiers.   I discussed the limitations, risks, security and privacy concerns of performing an evaluation and management service by telephone and the availability of in person appointments. I also discussed with the patient that there may be a patient responsible charge related to this service. The patient expressed understanding and agreed to proceed.    I discussed the assessment and treatment plan with the patient. The patient was provided an opportunity to ask questions and all were answered. The patient agreed with the plan and demonstrated an understanding of the instructions.   The patient was advised to call back or seek an in-person evaluation if the symptoms worsen or if the condition fails to improve as anticipated.  I provided 15 minutes of non-face-to-face time during this encounter. Location: provider office, patient home  Levonne Spiller, MD  [facility] MD/PA/NP OP Progress Note  11/30/2019 2:29 PM Kayla Sanchez  MRN:  [id number]  Chief Complaint:  Chief Complaint    Anxiety; Depression; Follow-up     HPI: This patient is a 24 year old single white female who lives with her 2 children in [place].  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  She states for the most part she was doing okay.  A couple of months ago she seemed to have chronic headaches and her blood pressure was elevated at all of her urgent care visits.  However she never consistently took the prescribed medicine for blood pressure.  She has not really checked it lately but states that she "feels fine" she has made changes in her posture and sleeping with less pillows and her headaches seem to be much better.  Overall her mood is pretty good and she is less anxious.  She uses the Lexapro every day  but rarely has to use the Xanax.  She is "talking" to her ex-boyfriend.  He is in the [employer] and they broke up but she is not sure where things are headed.  She denies serious depression or suicidal ideation Visit Diagnosis:    ICD-10-CM   1. Panic disorder  F41.0 escitalopram (LEXAPRO) 20 MG tablet    Past Psychiatric History: none  Past Medical History:  Past Medical History:  Diagnosis Date  . Anxiety   . Depression   . GERD (gastroesophageal reflux disease)     Past Surgical History:  Procedure Laterality Date  . WISDOM TOOTH EXTRACTION      Family Psychiatric History: see below  Family History:  Family History  Problem Relation Age of Onset  . Depression Mother   . Anxiety disorder Mother   . Drug abuse Father   . Anxiety disorder Maternal Aunt   . Alcohol abuse Maternal Grandfather   . Anxiety disorder Maternal Grandfather     Social History:  Social History   Socioeconomic History  . Marital status: Single    Spouse name: Not on file  . Number of children: Not on file  . Years of education: Not on file  . Highest education level: Not on file  Occupational History  . Not on file  Tobacco Use  . Smoking status: Current Every Day Smoker    Packs/day: 1.00    Years: 4.00    Pack years: 4.00    Types: Cigarettes  .  Smokeless tobacco: Never Used  Substance and Sexual Activity  . Alcohol use: Yes    Comment: 3 beers 1-2 x per week  . Drug use: No    Comment: 05-21-2016 per pt 2 yrs ago  . Sexual activity: Yes    Birth control/protection: None  Other Topics Concern  . Not on file  Social History Narrative  . Not on file   Social Determinants of Health   Financial Resource Strain:   . Difficulty of Paying Living Expenses:   Food Insecurity:   . Worried About Programme researcher, broadcasting/film/video in the Last Year:   . Barista in the Last Year:   Transportation Needs:   . Freight forwarder (Medical):   Marland Kitchen Lack of Transportation (Non-Medical):    Physical Activity:   . Days of Exercise per Week:   . Minutes of Exercise per Session:   Stress:   . Feeling of Stress :   Social Connections:   . Frequency of Communication with Friends and Family:   . Frequency of Social Gatherings with Friends and Family:   . Attends Religious Services:   . Active Member of Clubs or Organizations:   . Attends Banker Meetings:   Marland Kitchen Marital Status:     Allergies:  Allergies  Allergen Reactions  . Ciprofloxacin Other (See Comments)    Tendonitis   . Penicillins Hives    Has patient had a PCN reaction causing immediate rash, facial/tongue/throat swelling, SOB or lightheadedness with hypotension: No Has patient had a PCN reaction causing severe rash involving mucus membranes or skin necrosis: No Has patient had a PCN reaction that required hospitalization No Has patient had a PCN reaction occurring within the last 10 years: No If all of the above answers are "NO", then may proceed with Cephalosporin use.   . Nitrofurantoin Itching    Metabolic Disorder Labs: No results found for: HGBA1C, MPG No results found for: PROLACTIN No results found for: CHOL, TRIG, HDL, CHOLHDL, VLDL, LDLCALC Lab Results  Component Value Date   TSH 0.635 07/31/2016    Therapeutic Level Labs: No results found for: LITHIUM No results found for: VALPROATE No components found for:  CBMZ  Current Medications: Current Outpatient Medications  Medication Sig Dispense Refill  . ALPRAZolam (XANAX) 0.5 MG tablet TAKE 1 TABLET BY MOUTH THREE TIMES DAILY AS NEEDED FOR  SLEEP  OR  ANXIETY 90 tablet 2  . benzonatate (TESSALON) 100 MG capsule Take 1 capsule (100 mg total) by mouth 3 (three) times daily as needed. 30 capsule 0  . doxycycline (VIBRA-TABS) 100 MG tablet Take 1 tablet (100 mg total) by mouth 2 (two) times daily. 14 tablet 0  . escitalopram (LEXAPRO) 20 MG tablet Take 1 tablet (20 mg total) by mouth daily. 90 tablet 2  . hydrochlorothiazide  (HYDRODIURIL) 12.5 MG tablet Take 1 tablet (12.5 mg total) by mouth daily. (Patient not taking: Reported on 10/12/2019) 30 tablet 1  . levonorgestrel (LILETTA, 52 MG,) 19.5 MCG/DAY IUD IUD 1 each by Intrauterine route once.    Marland Kitchen omeprazole (PRILOSEC) 20 MG capsule Take 1 capsule (20 mg total) by mouth daily. (Patient not taking: Reported on 11/09/2019) 30 capsule 3   No current facility-administered medications for this visit.     Musculoskeletal: Strength & Muscle Tone: within normal limits Gait & Station: normal Patient leans: N/A  Psychiatric Specialty Exam: Review of Systems  All other systems reviewed and are negative.   There were no  vitals taken for this visit.There is no height or weight on file to calculate BMI.  General Appearance: Casual and Fairly Groomed  Eye Contact:  Good  Speech:  Clear and Coherent  Volume:  Normal  Mood:  Euthymic  Affect:  Appropriate and Congruent  Thought Process:  Goal Directed  Orientation:  Full (Time, Place, and Person)  Thought Content: WDL   Suicidal Thoughts:  No  Homicidal Thoughts:  No  Memory:  Immediate;   Good Recent;   Good Remote;   Good  Judgement:  Good  Insight:  Fair  Psychomotor Activity:  Normal  Concentration:  Concentration: Good and Attention Span: Good  Recall:  Good  Fund of Knowledge: Good  Language: Good  Akathisia:  No  Handed:  Right  AIMS (if indicated): not done  Assets:  Communication Skills Desire for Improvement Physical Health Resilience Social Support Talents/Skills  ADL's:  Intact  Cognition: WNL  Sleep:  Good   Screenings: GAD-7     Office Visit from 04/19/2016 in [facility] Office Visit from 02/28/2016 in [facility]  Total GAD-7 Score  20  19    PHQ2-9     Office Visit from 10/12/2019 in [facility] Visit from 02/10/2018 in [facility]18/2017 in [facility] Office Visit from 07/10/2017 in [facility]  PHQ-2 Total Score  1  0  0  0  0  PHQ-9 Total Score  1  --  --  --  --       Assessment and Plan: This patient is a 24 year old female with a history of anxiety and depression.  She seems to be doing better.  She will continue Lexapro 20 mg daily for depression.  She rarely uses the Xanax 0.5 mg up to 3 times daily for anxiety.  She will return to see me in 4 months   [name], MD 11/30/2019, 2:29 PM

## 2020-01-04 ENCOUNTER — Other Ambulatory Visit: Payer: Self-pay

## 2020-01-04 ENCOUNTER — Encounter: Payer: Self-pay | Admitting: Physician Assistant

## 2020-01-04 ENCOUNTER — Ambulatory Visit (INDEPENDENT_AMBULATORY_CARE_PROVIDER_SITE_OTHER): Payer: Managed Care, Other (non HMO) | Admitting: Physician Assistant

## 2020-01-04 ENCOUNTER — Ambulatory Visit: Payer: Managed Care, Other (non HMO) | Attending: Internal Medicine

## 2020-01-04 VITALS — BP 118/70 | HR 82 | Temp 98.1°F | Ht 62.0 in | Wt 196.0 lb

## 2020-01-04 DIAGNOSIS — R300 Dysuria: Secondary | ICD-10-CM

## 2020-01-04 DIAGNOSIS — Z20822 Contact with and (suspected) exposure to covid-19: Secondary | ICD-10-CM

## 2020-01-04 LAB — POCT URINALYSIS DIPSTICK
Bilirubin, UA: NEGATIVE
Blood, UA: NEGATIVE
Glucose, UA: NEGATIVE
Ketones, UA: NEGATIVE
Nitrite, UA: NEGATIVE
Protein, UA: NEGATIVE
Spec Grav, UA: 1.02 (ref 1.010–1.025)
Urobilinogen, UA: 0.2 E.U./dL
pH, UA: 6 (ref 5.0–8.0)

## 2020-01-04 MED ORDER — CEPHALEXIN 500 MG PO CAPS: 500.0000 mg | ORAL_CAPSULE | Freq: Two times a day (BID) | ORAL | 0 refills | Status: AC

## 2020-01-04 NOTE — Progress Notes (Signed)
Patient presents to clinic today c/o 5 days of dysuria, urinary urgency, urinary frequency, suprapubic pressure and now with some low back pain bilateral. Denies fever, chills, nausea or vomiting. Denies vaginal symptoms. IUD in place.    Past Medical History:  Diagnosis Date  . Anxiety   . Depression   . GERD (gastroesophageal reflux disease)     Current Outpatient Medications on File Prior to Visit  Medication Sig Dispense Refill  . ALPRAZolam (XANAX) 0.5 MG tablet TAKE 1 TABLET BY MOUTH THREE TIMES DAILY AS NEEDED FOR  SLEEP  OR  ANXIETY 90 tablet 2  . benzonatate (TESSALON) 100 MG capsule Take 1 capsule (100 mg total) by mouth 3 (three) times daily as needed. 30 capsule 0  . doxycycline (VIBRA-TABS) 100 MG tablet Take 1 tablet (100 mg total) by mouth 2 (two) times daily. 14 tablet 0  . escitalopram (LEXAPRO) 20 MG tablet Take 1 tablet (20 mg total) by mouth daily. 90 tablet 2  . hydrochlorothiazide (HYDRODIURIL) 12.5 MG tablet Take 1 tablet (12.5 mg total) by mouth daily. (Patient not taking: Reported on 10/12/2019) 30 tablet 1  . levonorgestrel (LILETTA, 52 MG,) 19.5 MCG/DAY IUD IUD 1 each by Intrauterine route once.    Marland Kitchen omeprazole (PRILOSEC) 20 MG capsule Take 1 capsule (20 mg total) by mouth daily. (Patient not taking: Reported on 11/09/2019) 30 capsule 3   No current facility-administered medications on file prior to visit.    Allergies  Allergen Reactions  . Ciprofloxacin Other (See Comments)    Tendonitis   . Penicillins Hives    Has patient had a PCN reaction causing immediate rash, facial/tongue/throat swelling, SOB or lightheadedness with hypotension: No Has patient had a PCN reaction causing severe rash involving mucus membranes or skin necrosis: No Has patient had a PCN reaction that required hospitalization No Has patient had a PCN reaction occurring within the last 10 years: No If all of the above answers are "NO", then may proceed with Cephalosporin use.   .  Nitrofurantoin Itching    Family History  Problem Relation Age of Onset  . Depression Mother   . Anxiety disorder Mother   . Drug abuse Father   . Anxiety disorder Maternal Aunt   . Alcohol abuse Maternal Grandfather   . Anxiety disorder Maternal Grandfather     Social History   Socioeconomic History  . Marital status: Single    Spouse name: Not on file  . Number of children: Not on file  . Years of education: Not on file  . Highest education level: Not on file  Occupational History  . Not on file  Tobacco Use  . Smoking status: Current Every Day Smoker    Packs/day: 1.00    Years: 4.00    Pack years: 4.00    Types: Cigarettes  . Smokeless tobacco: Never Used  Substance and Sexual Activity  . Alcohol use: Yes    Comment: 3 beers 1-2 x per week  . Drug use: No    Comment: 05-21-2016 per pt 2 yrs ago  . Sexual activity: Yes    Birth control/protection: None  Other Topics Concern  . Not on file  Social History Narrative  . Not on file   Social Determinants of Health   Financial Resource Strain:   . Difficulty of Paying Living Expenses:   Food Insecurity:   . Worried About Programme researcher, broadcasting/film/video in the Last Year:   . [employer] of Food in the Last  Year:   Transportation Needs:   . Freight forwarder (Medical):   Marland Kitchen Lack of Transportation (Non-Medical):   Physical Activity:   . Days of Exercise per Week:   . Minutes of Exercise per Session:   Stress:   . Feeling of Stress :   Social Connections:   . Frequency of Communication with Friends and Family:   . Frequency of Social Gatherings with Friends and Family:   . Attends Religious Services:   . Active Member of Clubs or Organizations:   . Attends Banker Meetings:   Marland Kitchen Marital Status:     Review of Systems - See HPI.  All other ROS are negative.  There were no vitals taken for this visit.  Physical Exam  Recent Results (from the past 2160 hour(s))  Urine cytology ancillary only([place])      Status: None   Collection Time: 10/12/19 11:27 AM  Result Value Ref Range   Neisseria Gonorrhea Negative    Chlamydia Negative    Trichomonas Negative    Comment Normal Reference Range Trichomonas - Negative    Comment Normal Reference [place] Chlamydia - Negative    Comment      Normal Reference Range Neisseria Gonorrhea - Negative  HIV Antibody (routine testing w rflx)     Status: None   Collection Time: 10/12/19  1:27 PM  Result Value Ref Range   HIV 1&2 Ab, 4th Generation NON-REACTIVE NON-REACTI    Comment: HIV-1 antigen and HIV-1/HIV-2 antibodies were not detected. There is no laboratory evidence of HIV infection. Marland Kitchen PLEASE NOTE: This information has been disclosed to you from records whose confidentiality may be protected by state law.  If your state requires such protection, then the state law prohibits you from making any further disclosure of the information without the specific written consent of the person to whom it pertains, or as otherwise permitted by law. A general authorization for the release of medical or other information is NOT sufficient for this purpose. . For additional information please refer to http://education.questdiagnostics.com/faq/FAQ106 (This link is being provided for informational/ educational purposes only.) . Marland Kitchen The performance of this assay has not been clinically validated in patients less than 49 years old. .   RPR     Status: None   Collection Time: 10/12/19  1:27 PM  Result Value Ref Range   RPR Ser Ql NON-REACTIVE NON-REACTI  HSV(herpes smplx)abs-1+2(IgG+IgM)-bld     Status: None   Collection Time: 10/12/19  1:27 PM  Result Value Ref Range   HSVI/II Comb IgM <0.91 0.00 - 0.90 Ratio    Comment:                                  Negative        <0.91                                  Equivocal 0.91 - 1.09                                  Positive        >1.09    HSV 1 Glycoprotein G Ab, IgG <0.91 0.00 - 0.90 index    Comment:  Negative        <0.91                                  Equivocal 0.91 - 1.09                                  Positive        >1.09  Note: Negative indicates no antibodies detected to  HSV-1. Equivocal may suggest early infection.  If  clinically appropriate, retest at later date. Positive  indicates antibodies detected to HSV-1.    HSV 2 IgG, Type Spec <0.91 0.00 - 0.90 index    Comment:                                  Negative        <0.91                                  Equivocal 0.91 - 1.09                                  Positive        >1.09  Note: Negative indicates no antibodies detected to  HSV-2. Equivocal may suggest early infection.  If  clinically appropriate, retest at later date. Positive  indicates antibodies detected to HSV-2.   Urine Culture     Status: None   Collection Time: 10/12/19  1:27 PM   Specimen: Blood  Result Value Ref Range   MICRO NUMBER: [id number]    SPECIMEN QUALITY: Adequate    Sample Source URINE    STATUS: FINAL    ISOLATE 1:      Growth of mixed flora was isolated, suggesting probable contamination. No further testing will be performed. If clinically indicated, recollection using a method to minimize contamination, with prompt transfer to Urine Culture Transport Tube, is  recommended.   Novel Coronavirus, NAA ([facility])     Status: None   Collection Time: 10/13/19  1:52 PM   Specimen: Nasopharyngeal(NP) swabs in vial transport medium   NASOPHARYNGE  TESTING  Result Value Ref Range   SARS-CoV-2, NAA Not Detected Not Detected    Comment: This nucleic acid amplification test was developed and its performance characteristics determined by [employer], [employer] and [employer]. Nucleic acid amplification tests include RT-PCR and TMA. This test has not been FDA cleared or approved. This test has been authorized by FDA under an Emergency Use Authorization (EUA). This test is only authorized for the duration of time the declaration  that circumstances exist justifying the authorization of the emergency use of in vitro diagnostic tests for detection of SARS-CoV-2 virus and/or diagnosis of COVID-19 infection under section 564(b)(1) of the Act, 21 U.S.C. 235TIR-4(E) (1), unless the authorization is terminated or revoked sooner. When diagnostic testing is negative, the possibility of a false negative result should be considered in the context of a patient's recent exposures and the presence of clinical signs and symptoms consistent with COVID-19. An individual without symptoms of COVID-19 and who is not shedding SARS-CoV-2 virus wo uld expect to have a negative (not detected) result in this assay.   Urine Culture  Status: None   Collection Time: 10/23/19  1:17 PM   Specimen: Urine  Result Value Ref Range   MICRO NUMBER: [id number]    SPECIMEN QUALITY: Adequate    Sample Source URINE    STATUS: FINAL    Result:      Growth of mixed flora was isolated, suggesting probable contamination. No further testing will be performed. If clinically indicated, recollection using a method to minimize contamination, with prompt transfer to Urine Culture Transport Tube, is  recommended.   POCT Urinalysis Dipstick     Status: Abnormal   Collection Time: 10/23/19  1:20 PM  Result Value Ref Range   Color, UA pale yellow    Clarity, UA clear    Glucose, UA Negative Negative   Bilirubin, UA Negative    Ketones, UA Negative    Spec Grav, UA <=1.005 (A) 1.010 - 1.025   Blood, UA Negative    pH, UA 6.5 5.0 - 8.0   Protein, UA Negative Negative   Urobilinogen, UA 0.2 0.2 or 1.0 E.U./dL   Nitrite, UA Negative    Leukocytes, UA Negative Negative   Appearance     Odor      Assessment/Plan: 1. Dysuria Urine dip with large LE. Will send for culture. Will start empiric treatment for UTI with Keflex 500 mg BID. Supportive measures and OTC medications reviewed. Will alter treatment regimen based on culture results. Recommend she  follow-up with her Urologist giving frequent UTI.  - POCT urinalysis dipstick - Urine Culture - cephALEXin (KEFLEX) 500 MG capsule; Take 1 capsule (500 mg total) by mouth 2 (two) times daily for 7 days.  Dispense: 14 capsule; Refill: 0  This visit occurred during the SARS-CoV-2 public health emergency.  Safety protocols were in place, including screening questions prior to the visit, additional usage of staff PPE, and extensive cleaning of exam room while observing appropriate contact time as indicated for disinfecting solutions.     Piedad Climes, PA-C

## 2020-01-04 NOTE — Patient Instructions (Signed)
Your symptoms are consistent with a bladder infection, also called acute cystitis. Please take your antibiotic (Keflex) as directed until all pills are gone.  Stay very well hydrated.  Consider a daily probiotic (Align, Culturelle, or Activia) to help prevent stomach upset caused by the antibiotic.  Taking a probiotic daily may also help prevent recurrent UTIs.  Also consider taking AZO (Phenazopyridine) tablets to help decrease pain with urination.  I will call you with your urine testing results.  We will change antibiotics if indicated.  Call or return to clinic if symptoms are not resolved by completion of antibiotic.   Urinary Tract Infection A urinary tract infection (UTI) can occur any place along the urinary tract. The tract includes the kidneys, ureters, bladder, and urethra. A type of germ called bacteria often causes a UTI. UTIs are often helped with antibiotic medicine.  HOME CARE   If given, take antibiotics as told by your doctor. Finish them even if you start to feel better.  Drink enough fluids to keep your pee (urine) clear or pale yellow.  Avoid tea, drinks with caffeine, and bubbly (carbonated) drinks.  Pee often. Avoid holding your pee in for a long time.  Pee before and after having sex (intercourse).  Wipe from front to back after you poop (bowel movement) if you are a woman. Use each tissue only once. GET HELP RIGHT AWAY IF:   You have back pain.  You have lower belly (abdominal) pain.  You have chills.  You feel sick to your stomach (nauseous).  You throw up (vomit).  Your burning or discomfort with peeing does not go away.  You have a fever.  Your symptoms are not better in 3 days. MAKE SURE YOU:   Understand these instructions.  Will watch your condition.  Will get help right away if you are not doing well or get worse. Document Released: 01/02/2008 Document Revised: 04/09/2012 Document Reviewed: 02/14/2012 ExitCare Patient Information 2015  [employer]. This information is not intended to replace advice given to you by your health care provider. Make sure you discuss any questions you have with your health care provider.   

## 2020-01-05 LAB — URINE CULTURE
MICRO NUMBER:: 10561061
SPECIMEN QUALITY:: ADEQUATE

## 2020-01-05 LAB — SARS-COV-2, NAA 2 DAY TAT

## 2020-01-05 LAB — NOVEL CORONAVIRUS, NAA: SARS-CoV-2, NAA: NOT DETECTED

## 2020-01-10 IMAGING — DX DG ABDOMEN 1V
1 series · 1 of 1 positions shown · non-contrast
Comparison: None.

CLINICAL DATA: 22-year-old female with left flank pain. History of
kidney stones. Initial encounter.

EXAM:
ABDOMEN - 1 VIEW

[abdomen kub]
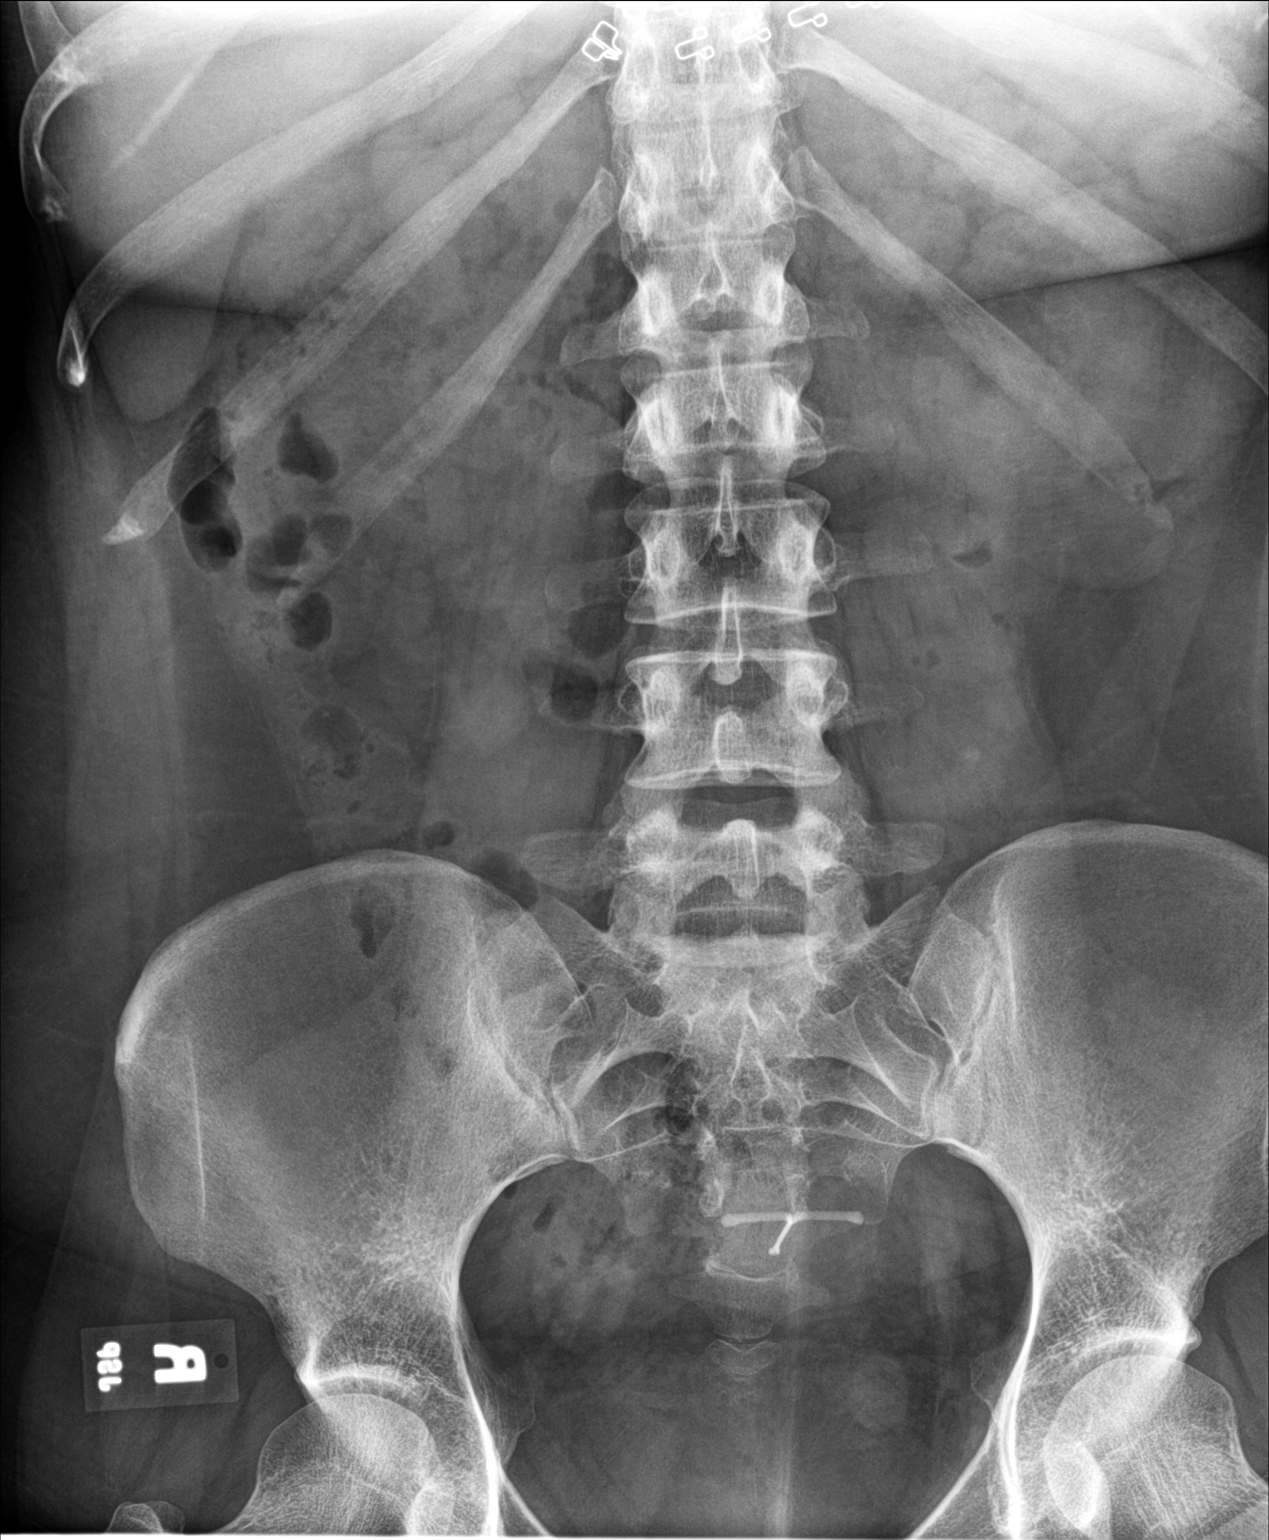

[1 of 1 positions shown; findings below may reference images not displayed]

FINDINGS: Left-sided 5 mm rounded calcification (lower L4 level). Question
possible left ureteral calculus.

No renal calculi noted although overlying stool slightly limits
evaluation.

IUD in place.

No acute osseous abnormality.
IMPRESSION: Left-sided 5 mm rounded calcification (lower L4 level). Question
possible left ureteral calculus.

## 2020-01-29 ENCOUNTER — Encounter: Payer: Self-pay | Admitting: Physician Assistant

## 2020-01-29 DIAGNOSIS — L709 Acne, unspecified: Secondary | ICD-10-CM

## 2020-02-19 ENCOUNTER — Ambulatory Visit: Payer: Managed Care, Other (non HMO) | Admitting: Physician Assistant

## 2020-02-25 ENCOUNTER — Ambulatory Visit: Payer: Managed Care, Other (non HMO) | Admitting: Family Medicine

## 2020-02-25 ENCOUNTER — Encounter: Payer: Self-pay | Admitting: Family Medicine

## 2020-02-25 ENCOUNTER — Other Ambulatory Visit: Payer: Self-pay

## 2020-02-25 ENCOUNTER — Other Ambulatory Visit (HOSPITAL_COMMUNITY)
Admission: RE | Admit: 2020-02-25 | Discharge: 2020-02-25 | Disposition: A | Discharge status: Home / Self Care | Payer: Managed Care, Other (non HMO) | Source: Ambulatory Visit | Attending: Family Medicine | Admitting: Family Medicine

## 2020-02-25 VITALS — BP 124/84 | HR 88 | Temp 97.9°F | Resp 16 | Ht 62.0 in | Wt 200.0 lb

## 2020-02-25 DIAGNOSIS — H6983 Other specified disorders of Eustachian tube, bilateral: Secondary | ICD-10-CM

## 2020-02-25 DIAGNOSIS — B9689 Other specified bacterial agents as the cause of diseases classified elsewhere: Secondary | ICD-10-CM

## 2020-02-25 DIAGNOSIS — J019 Acute sinusitis, unspecified: Secondary | ICD-10-CM

## 2020-02-25 DIAGNOSIS — Z202 Contact with and (suspected) exposure to infections with a predominantly sexual mode of transmission: Secondary | ICD-10-CM | POA: Diagnosis not present

## 2020-02-25 MED ORDER — FLUTICASONE PROPIONATE 50 MCG/ACT NA SUSP
2.0000 | Freq: Every day | NASAL | 6 refills | Status: DC
Start: 2020-02-25 — End: 2021-10-23

## 2020-02-25 MED ORDER — DOXYCYCLINE HYCLATE 100 MG PO TABS
100.0000 mg | ORAL_TABLET | Freq: Two times a day (BID) | ORAL | 0 refills | Status: DC
Start: 2020-02-25 — End: 2020-03-09

## 2020-02-25 NOTE — Progress Notes (Signed)
   Subjective:    Patient ID: Kayla Sanchez, female    DOB: 1996/02/18, 24 y.o.   MRN: [id number]  HPI Dizziness- sxs started 2 days ago.  Ears feel 'full', head feels 'full'.  No fevers.  Mild frontal sinus pain. + tooth pain.  Pain when leaning forward. No drainage from ears.  Some muffled hearing.  Has sensation of being on a boat when walking.  No nausea or vomiting.  Not currently taking allergy medication.  + nasal and chest congestion.  + cough- wet but not productive.  No known sick contacts.    Possible exposure to STDs- pt is asymptomatic.  Has a new partner.  Review of Systems For ROS see HPI   This visit occurred during the SARS-CoV-2 public health emergency.  Safety protocols were in place, including screening questions prior to the visit, additional usage of staff PPE, and extensive cleaning of exam room while observing appropriate contact time as indicated for disinfecting solutions.       Objective:   Physical Exam Vitals reviewed.  Constitutional:      General: She is not in acute distress.    Appearance: She is well-developed. She is obese.  HENT:     Head: Normocephalic and atraumatic.     Right Ear: Tympanic membrane is retracted.     Left Ear: Tympanic membrane is retracted.     Nose: Mucosal edema and rhinorrhea present.     Right Sinus: Maxillary sinus tenderness and frontal sinus tenderness present.     Left Sinus: Maxillary sinus tenderness and frontal sinus tenderness present.  Eyes:     Conjunctiva/sclera: Conjunctivae normal.     Pupils: Pupils are equal, round, and reactive to light.  Cardiovascular:     Rate and Rhythm: Normal rate and regular rhythm.     Heart sounds: Normal heart sounds.  Pulmonary:     Effort: Pulmonary effort is normal. No respiratory distress.     Breath sounds: Normal breath sounds. No wheezing.  Musculoskeletal:     Cervical back: Normal range of motion and neck supple.  Lymphadenopathy:     Cervical: No cervical  adenopathy.           Assessment & Plan:  Bacterial sinusitis- new.  Pt's sxs and PE consistent w/ infxn.  This is likely the cause of her episodic dizziness as her TMs are markedly retracted.  Start Doxy due to PCN allergy and add Flonase to improve eustachian tube dysfxn.  Reviewed supportive care and red flags that should prompt return.  Pt expressed understanding and is in agreement w/ plan.   Eustachian tube dysfxn- bilateral.  Start Flonase.  Pt expressed understanding and is in agreement w/ plan.   Possible exposure to STI- asymptomatic but pt has new partner and wants to be sure she does not have anything.  She declines RPR and HIV

## 2020-02-25 NOTE — Patient Instructions (Addendum)
Follow up as needed or as scheduled START the Doxycycline twice daily- take w/ food and avoid direct sun exposure Drink LOTS of fluids ADD the nasal spray- 2 sprays each nostril daily We'll notify you of your lab results and treat anything if needed Call with any questions or concerns Hang in there!!!

## 2020-02-29 LAB — URINE CYTOLOGY ANCILLARY ONLY
Chlamydia: NEGATIVE
Comment: NEGATIVE
Comment: NEGATIVE
Comment: NORMAL
Neisseria Gonorrhea: NEGATIVE
Trichomonas: NEGATIVE

## 2020-03-02 ENCOUNTER — Other Ambulatory Visit: Payer: Self-pay

## 2020-03-02 ENCOUNTER — Telehealth (INDEPENDENT_AMBULATORY_CARE_PROVIDER_SITE_OTHER): Payer: 59 | Admitting: Psychiatry

## 2020-03-02 DIAGNOSIS — Z5329 Procedure and treatment not carried out because of patient's decision for other reasons: Secondary | ICD-10-CM

## 2020-03-03 ENCOUNTER — Other Ambulatory Visit: Payer: Self-pay

## 2020-03-03 ENCOUNTER — Encounter (HOSPITAL_COMMUNITY): Payer: Self-pay | Admitting: Psychiatry

## 2020-03-03 ENCOUNTER — Telehealth (INDEPENDENT_AMBULATORY_CARE_PROVIDER_SITE_OTHER): Payer: 59 | Admitting: Psychiatry

## 2020-03-03 DIAGNOSIS — F410 Panic disorder [episodic paroxysmal anxiety] without agoraphobia: Secondary | ICD-10-CM | POA: Diagnosis not present

## 2020-03-03 MED ORDER — ESCITALOPRAM OXALATE 20 MG PO TABS: 20.0000 mg | ORAL_TABLET | Freq: Every day | ORAL | 2 refills | Status: DC

## 2020-03-03 MED ORDER — ALPRAZOLAM 0.5 MG PO TABS: ORAL_TABLET | ORAL | 2 refills | Status: DC

## 2020-03-03 NOTE — Progress Notes (Signed)
Virtual Visit via Video Note  I connected with Kayla Sanchez on 03/03/20 at  2:00 PM EDT by a video enabled telemedicine application and verified that I am speaking with the correct person using two identifiers.   I discussed the limitations of evaluation and management by telemedicine and the availability of in person appointments. The patient expressed understanding and agreed to proceed.    I discussed the assessment and treatment plan with the patient. The patient was provided an opportunity to ask questions and all were answered. The patient agreed with the plan and demonstrated an understanding of the instructions.   The patient was advised to call back or seek an in-person evaluation if the symptoms worsen or if the condition fails to improve as anticipated.  I provided 15 minutes of non-face-to-face time during this encounter. Location: Provider office, patient home  [name], MD  [facility] MD/PA/NP OP Progress Note  03/03/2020 2:13 PM Kayla Sanchez  MRN:  [id number]  Chief Complaint:  Chief Complaint    Depression; Anxiety; Follow-up     HPI: This patient is a 24 year old single white female who lives with her 2 children in [place].  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  The patient returns for follow-up after 3 months.  She states that she has been under a lot of stress lately.  Her grandmother died recently and they have been very close.  The man she was dating ended up getting the new girlfriend which really hurt her feelings.  She and the father of her children are going through conflicts regarding the children's visitation schedule and they may need to go to court.  She states at times she feels dissociated from herself which is a type of anxiety.  I urged her to use breathing and mindfulness to get herself back on track.  She is somewhat anxious but the Xanax helps and she thinks the Lexapro continues to help her depression and anxiety.  She denies any  thoughts of suicide or self-harm Visit Diagnosis:    ICD-10-CM   1. Panic disorder  F41.0 escitalopram (LEXAPRO) 20 MG tablet    Past Psychiatric History: none  Past Medical History:  Past Medical History:  Diagnosis Date  . Anxiety   . Depression   . GERD (gastroesophageal reflux disease)     Past Surgical History:  Procedure Laterality Date  . WISDOM TOOTH EXTRACTION      Family Psychiatric History: see below Family History:  Family History  Problem Relation Age of Onset  . Depression Mother   . Anxiety disorder Mother   . Drug abuse Father   . Anxiety disorder Maternal Aunt   . Alcohol abuse Maternal Grandfather   . Anxiety disorder Maternal Grandfather     Social History:  Social History   Socioeconomic History  . Marital status: Single    Spouse name: Not on file  . Number of children: Not on file  . Years of education: Not on file  . Highest education level: Not on file  Occupational History  . Not on file  Tobacco Use  . Smoking status: Current Every Day Smoker    Packs/day: 1.00    Years: 4.00    Pack years: 4.00    Types: Cigarettes  . Smokeless tobacco: Never Used  Vaping Use  . Vaping Use: Former  Substance and Sexual Activity  . Alcohol use: Yes    Comment: 3 beers 1-2 x per week  . Drug use:  No    Comment: 05-21-2016 per pt 2 yrs ago  . Sexual activity: Yes    Birth control/protection: None  Other Topics Concern  . Not on file  Social History Narrative  . Not on file   Social Determinants of Health   Financial Resource Strain:   . Difficulty of Paying Living Expenses:   Food Insecurity:   . Worried About Programme researcher, broadcasting/film/video in the Last Year:   . Barista in the Last Year:   Transportation Needs:   . Freight forwarder (Medical):   Marland Kitchen Lack of Transportation (Non-Medical):   Physical Activity:   . Days of Exercise per Week:   . Minutes of Exercise per Session:   Stress:   . Feeling of Stress :   Social Connections:    . Frequency of Communication with Friends and Family:   . Frequency of Social Gatherings with Friends and Family:   . Attends Religious Services:   . Active Member of Clubs or Organizations:   . Attends Banker Meetings:   Marland Kitchen Marital Status:     Allergies:  Allergies  Allergen Reactions  . Ciprofloxacin Other (See Comments)    Tendonitis   . Penicillins Hives    Has patient had a PCN reaction causing immediate rash, facial/tongue/throat swelling, SOB or lightheadedness with hypotension: No Has patient had a PCN reaction causing severe rash involving mucus membranes or skin necrosis: No Has patient had a PCN reaction that required hospitalization No Has patient had a PCN reaction occurring within the last 10 years: No If all of the above answers are "NO", then may proceed with Cephalosporin use.   . Nitrofurantoin Itching    Metabolic Disorder Labs: No results found for: HGBA1C, MPG No results found for: PROLACTIN No results found for: CHOL, TRIG, HDL, CHOLHDL, VLDL, LDLCALC Lab Results  Component Value Date   TSH 0.635 07/31/2016    Therapeutic Level Labs: No results found for: LITHIUM No results found for: VALPROATE No components found for:  CBMZ  Current Medications: Current Outpatient Medications  Medication Sig Dispense Refill  . ALPRAZolam (XANAX) 0.5 MG tablet TAKE 1 TABLET BY MOUTH THREE TIMES DAILY AS NEEDED FOR  SLEEP  OR  ANXIETY 90 tablet 2  . doxycycline (VIBRA-TABS) 100 MG tablet Take 1 tablet (100 mg total) by mouth 2 (two) times daily. 14 tablet 0  . escitalopram (LEXAPRO) 20 MG tablet Take 1 tablet (20 mg total) by mouth daily. 90 tablet 2  . fluticasone (FLONASE) 50 MCG/ACT nasal spray Place 2 sprays into both nostrils daily. 16 g 6  . hydrochlorothiazide (HYDRODIURIL) 12.5 MG tablet Take 1 tablet (12.5 mg total) by mouth daily. (Patient not taking: Reported on 10/12/2019) 30 tablet 1  . levonorgestrel (LILETTA, 52 MG,) 19.5 MCG/DAY IUD IUD 1  each by Intrauterine route once.    Marland Kitchen omeprazole (PRILOSEC) 20 MG capsule Take 1 capsule (20 mg total) by mouth daily. 30 capsule 3   No current facility-administered medications for this visit.     Musculoskeletal: Strength & Muscle Tone: within normal limits Gait & Station: normal Patient leans: N/A  Psychiatric Specialty Exam: Review of Systems  Psychiatric/Behavioral: The patient is nervous/anxious.   All other systems reviewed and are negative.   There were no vitals taken for this visit.There is no height or weight on file to calculate BMI.  General Appearance: Casual and Fairly Groomed  Eye Contact:  Good  Speech:  Clear and Coherent  Volume:  Normal  Mood:  Anxious  Affect:  Appropriate and Congruent  Thought Process:  Goal Directed  Orientation:  Full (Time, Place, and Person)  Thought Content: Rumination   Suicidal Thoughts:  No  Homicidal Thoughts:  No  Memory:  Immediate;   Good Recent;   Good Remote;   Good  Judgement:  Good  Insight:  Fair  Psychomotor Activity:  Normal  Concentration:  Concentration: Good and Attention Span: Good  Recall:  Good  Fund of Knowledge: Good  Language: Good  Akathisia:  No  Handed:  Right  AIMS (if indicated): not done  Assets:  Communication Skills Desire for Improvement Physical Health Resilience Social Support Talents/Skills  ADL's:  Intact  Cognition: WNL  Sleep:  Good   Screenings: GAD-7     Office Visit from 04/19/2016 in [facility] Office Visit from 02/28/2016 in [facility]  Total GAD-7 Score 20 19    PHQ2-9     Office Visit from 02/25/2020 in [facility] Office Visit from 10/12/2019 in [facility] Office Visit from 02/10/2018 in [facility] Office Visit from 10/03/2017 in [facility] Office Visit from 07/18/2017 in [facility]  PHQ-2 Total Score 0 1 0 0 0  PHQ-9 Total Score 0 1 -- -- --       Assessment and Plan: This patient is a 24 year old female with a history of anxiety and depression.  She continues to do fairly well although she is dealing with more anxiety.  She is using the Xanax a little bit more frequently but she can use the Xanax 0.5 mg up to 3 times daily.  She will continue Lexapro 20 mg daily for depression and anxiety.  She will return to see me in 2 months   [name], MD 03/03/2020, 2:13 PM

## 2020-03-09 ENCOUNTER — Ambulatory Visit: Payer: Managed Care, Other (non HMO) | Admitting: Physician Assistant

## 2020-03-09 ENCOUNTER — Other Ambulatory Visit (HOSPITAL_COMMUNITY)
Admission: RE | Admit: 2020-03-09 | Discharge: 2020-03-09 | Disposition: A | Discharge status: Home / Self Care | Payer: Managed Care, Other (non HMO) | Source: Ambulatory Visit | Attending: Physician Assistant | Admitting: Physician Assistant

## 2020-03-09 ENCOUNTER — Encounter: Payer: Self-pay | Admitting: Physician Assistant

## 2020-03-09 ENCOUNTER — Other Ambulatory Visit: Payer: Self-pay

## 2020-03-09 VITALS — BP 118/78 | HR 95 | Temp 98.7°F | Ht 62.0 in | Wt 204.0 lb

## 2020-03-09 DIAGNOSIS — R300 Dysuria: Secondary | ICD-10-CM

## 2020-03-09 DIAGNOSIS — Z113 Encounter for screening for infections with a predominantly sexual mode of transmission: Secondary | ICD-10-CM | POA: Insufficient documentation

## 2020-03-09 DIAGNOSIS — R1084 Generalized abdominal pain: Secondary | ICD-10-CM | POA: Diagnosis not present

## 2020-03-09 DIAGNOSIS — R109 Unspecified abdominal pain: Secondary | ICD-10-CM | POA: Insufficient documentation

## 2020-03-09 LAB — CBC WITH DIFFERENTIAL/PLATELET
Absolute Monocytes: 593 cells/uL (ref 200–950)
Basophils Absolute: 48 cells/uL (ref 0–200)
Basophils Relative: 0.7 %
Eosinophils Absolute: 179 cells/uL (ref 15–500)
Eosinophils Relative: 2.6 %
HCT: 48.5 % — ABNORMAL HIGH (ref 35.0–45.0)
Hemoglobin: 17.1 g/dL — ABNORMAL HIGH (ref 11.7–15.5)
Lymphs Abs: 1863 cells/uL (ref 850–3900)
MCH: 34.2 pg — ABNORMAL HIGH (ref 27.0–33.0)
MCHC: 35.3 g/dL (ref 32.0–36.0)
MCV: 97 fL (ref 80.0–100.0)
MPV: 10.8 fL (ref 7.5–12.5)
Monocytes Relative: 8.6 %
Neutro Abs: 4216 cells/uL (ref 1500–7800)
Neutrophils Relative %: 61.1 %
Platelets: 213 10*3/uL (ref 140–400)
RBC: 5 10*6/uL (ref 3.80–5.10)
RDW: 12.5 % (ref 11.0–15.0)
Total Lymphocyte: 27 %
WBC: 6.9 10*3/uL (ref 3.8–10.8)

## 2020-03-09 LAB — POC URINALSYSI DIPSTICK (AUTOMATED)
Bilirubin, UA: NEGATIVE
Blood, UA: NEGATIVE
Glucose, UA: NEGATIVE
Ketones, UA: NEGATIVE
Leukocytes, UA: NEGATIVE
Nitrite, UA: NEGATIVE
Protein, UA: NEGATIVE
Spec Grav, UA: 1.015 (ref 1.010–1.025)
Urobilinogen, UA: 0.2 E.U./dL
pH, UA: 6 (ref 5.0–8.0)

## 2020-03-09 LAB — POCT URINE PREGNANCY: Preg Test, Ur: NEGATIVE

## 2020-03-09 NOTE — Progress Notes (Signed)
Kayla Sanchez is a 24 y.o. female here for a possible kidney infection.  I acted as a Neurosurgeon for [employer], PA-C Kayla Sanchez, [place]  History of Present Illness:   Chief Complaint  Patient presents with  . Pyelonephritis    HPI   Back pain Pt c/o lower abdomen and low back pain. Symptoms stated last week.  Pt took Cystex, which helped temporarily. Pt is having burning while urinating, when she does urinate she doesn't feel like she empties her bladder all of the way.   She is concerned because she is having recurrent symptoms. Per chart review this is her 5th documented episode in about 6 months. Reports that each time this happens she has an inconclusive urine culture.  Had slight vaginal discharge yesterday, white in appearance. Does have hx of BV but several years ago.  She is sexually active with more than one partner and is agreeable to STI testing today.  She used to go to urologist awhile back when she had kidney stones -- but this was when she was pregnant and has not been back since.  Denies: fevers, chills, nausea, vomiting, malaise, recent painful sex   Past Medical History:  Diagnosis Date  . Anxiety   . Depression   . GERD (gastroesophageal reflux disease)      Social History   Tobacco Use  . Smoking status: Current Every Day Smoker    Packs/day: 1.00    Years: 4.00    Pack years: 4.00    Types: Cigarettes  . Smokeless tobacco: Never Used  Vaping Use  . Vaping Use: Former  Substance Use Topics  . Alcohol use: Yes    Comment: 3 beers 1-2 x per week  . Drug use: No    Comment: 05-21-2016 per pt 2 yrs ago    Past Surgical History:  Procedure Laterality Date  . WISDOM TOOTH EXTRACTION      Family History  Problem Relation Age of Onset  . Depression Mother   . Anxiety disorder Mother   . Drug abuse Father   . Anxiety disorder Maternal Aunt   . Alcohol abuse Maternal Grandfather   . Anxiety disorder Maternal Grandfather     Allergies   Allergen Reactions  . Ciprofloxacin Other (See Comments)    Tendonitis   . Penicillins Hives    Has patient had a PCN reaction causing immediate rash, facial/tongue/throat swelling, SOB or lightheadedness with hypotension: No Has patient had a PCN reaction causing severe rash involving mucus membranes or skin necrosis: No Has patient had a PCN reaction that required hospitalization No Has patient had a PCN reaction occurring within the last 10 years: No If all of the above answers are "NO", then may proceed with Cephalosporin use.   . Nitrofurantoin Itching    Current Medications:   Current Outpatient Medications:  .  ALPRAZolam (XANAX) 0.5 MG tablet, TAKE 1 TABLET BY MOUTH THREE TIMES DAILY AS NEEDED FOR  SLEEP  OR  ANXIETY, Disp: 90 tablet, Rfl: 2 .  escitalopram (LEXAPRO) 20 MG tablet, Take 1 tablet (20 mg total) by mouth daily., Disp: 90 tablet, Rfl: 2 .  levonorgestrel (LILETTA, 52 MG,) 19.5 MCG/DAY IUD IUD, 1 each by Intrauterine route once., Disp: , Rfl:  .  omeprazole (PRILOSEC) 20 MG capsule, Take 1 capsule (20 mg total) by mouth daily., Disp: 30 capsule, Rfl: 3 .  fluticasone (FLONASE) 50 MCG/ACT nasal spray, Place 2 sprays into both nostrils daily. (Patient not taking: Reported on  03/09/2020), Disp: 16 g, Rfl: 6 .  hydrochlorothiazide (HYDRODIURIL) 12.5 MG tablet, Take 1 tablet (12.5 mg total) by mouth daily. (Patient not taking: Reported on 03/09/2020), Disp: 30 tablet, Rfl: 1   Review of Systems:   ROS  Negative unless otherwise specified per HPI.  Vitals:   Vitals:   03/09/20 1336  BP: 118/78  Pulse: 95  Temp: 98.7 F (37.1 C)  TempSrc: Temporal  SpO2: 96%  Weight: 204 lb (92.5 kg)  Height: 5\' 2"  (1.575 m)     Body mass index is 37.31 kg/m.  Physical Exam:   Physical Exam Vitals and nursing note reviewed.  Constitutional:      General: She is not in acute distress.    Appearance: She is well-developed. She is not ill-appearing or toxic-appearing.   Cardiovascular:     Rate and Rhythm: Normal rate and regular rhythm.     Pulses: Normal pulses.     Heart sounds: Normal heart sounds, S1 normal and S2 normal.     Comments: No LE edema Pulmonary:     Effort: Pulmonary effort is normal.     Breath sounds: Normal breath sounds.  Abdominal:     General: Abdomen is flat. Bowel sounds are normal.     Palpations: Abdomen is soft.     Tenderness: There is no abdominal tenderness. There is no right CVA tenderness or left CVA tenderness.  Skin:    General: Skin is warm and dry.  Neurological:     Mental Status: She is alert.     GCS: GCS eye subscore is 4. GCS verbal subscore is 5. GCS motor subscore is 6.  Psychiatric:        Speech: Speech normal.        Behavior: Behavior normal. Behavior is cooperative.     Results for orders placed or performed in visit on 03/09/20  POCT Urinalysis Dipstick (Automated)  Result Value Ref Range   Color, UA dark    Clarity, UA coudy    Glucose, UA Negative Negative   Bilirubin, UA Neg    Ketones, UA Neg    Spec Grav, UA 1.015 1.010 - 1.025   Blood, UA neg    pH, UA 6.0 5.0 - 8.0   Protein, UA Negative Negative   Urobilinogen, UA 0.2 0.2 or 1.0 E.U./dL   Nitrite, UA neg    Leukocytes, UA Negative Negative  POCT urine pregnancy  Result Value Ref Range   Preg Test, Ur Negative Negative    Assessment and Plan:   Kayla Sanchez was seen today for pyelonephritis.  Diagnoses and all orders for this visit:  Dysuria UA is negative. Will await culture results to determine treatment; also testing for BV and yeast today. Consider urology referral. Worsening precautions advised. -     POCT Urinalysis Dipstick (Automated) -     POCT urine pregnancy -     Urine Culture; Future -     Urine Culture  Generalized abdominal pain; Flank pain No acute abdominal pain or CVA tenderness today. Vitals stable. Given chronicity and frequency of symptoms, will update CMP and CBC today. She is always  requesting imaging today. Will order CT scan. -     CT RENAL STONE STUDY; Future -     Comprehensive metabolic panel; Future -     CBC with Differential/Platelet; Future -     Cervicovaginal ancillary only( [place]) -     CBC with Differential/Platelet -     Comprehensive metabolic panel  Screening examination for STD (sexually transmitted disease) Will update STD screening today, she is also agreeable to HIV and RPR. Discussion about HSV as recent partner has this, she is agreeable to this today and is able to verbalize the intracacies of interpretation of these results. -     HSV(herpes simplex vrs) 1+2 ab-IgG; Future -     HIV Antibody (routine testing w rflx); Future -     RPR; Future -     RPR -     HIV Antibody (routine testing w rflx) -     HSV(herpes simplex vrs) 1+2 ab-IgG  . Reviewed expectations re: course of current medical issues. . Discussed self-management of symptoms. . Outlined signs and symptoms indicating need for more acute intervention. . Patient verbalized understanding and all questions were answered. . See orders for this visit as documented in the electronic medical record. . Patient received an After-Visit Summary.  CMA or LPN served as scribe during this visit. History, Physical, and Plan performed by medical provider. The above documentation has been reviewed and is accurate and complete.  Time spent with patient today was 25 minutes which consisted of chart review, discussing diagnosis, work up, treatment answering questions and documentation.   Jarold Motto, PA-C

## 2020-03-09 NOTE — Patient Instructions (Signed)
It was great to see you!  Labs today to check for HIV, syphilis, herpes, and update kidney/liver function.  I will be in touch with all results.  Please schedule your CT scan.  Next steps would include referral to urologist if indicated.  Take care,  Jarold Motto PA-C

## 2020-03-10 ENCOUNTER — Ambulatory Visit
Admission: RE | Admit: 2020-03-10 | Discharge: 2020-03-10 | Disposition: A | Discharge status: Home / Self Care | Payer: Managed Care, Other (non HMO) | Source: Ambulatory Visit | Attending: Physician Assistant | Admitting: Physician Assistant

## 2020-03-10 ENCOUNTER — Encounter: Payer: Self-pay | Admitting: Physician Assistant

## 2020-03-10 ENCOUNTER — Other Ambulatory Visit: Payer: Self-pay | Admitting: Physician Assistant

## 2020-03-10 DIAGNOSIS — R1084 Generalized abdominal pain: Secondary | ICD-10-CM

## 2020-03-10 LAB — COMPREHENSIVE METABOLIC PANEL
AG Ratio: 1.6 (calc) (ref 1.0–2.5)
ALT: 18 U/L (ref 6–29)
AST: 15 U/L (ref 10–30)
Albumin: 4.2 g/dL (ref 3.6–5.1)
Alkaline phosphatase (APISO): 76 U/L (ref 31–125)
BUN: 12 mg/dL (ref 7–25)
CO2: 28 mmol/L (ref 20–32)
Calcium: 10.2 mg/dL (ref 8.6–10.2)
Chloride: 100 mmol/L (ref 98–110)
Creat: 0.74 mg/dL (ref 0.50–1.10)
Globulin: 2.7 g/dL (calc) (ref 1.9–3.7)
Glucose, Bld: 85 mg/dL (ref 65–99)
Potassium: 4.2 mmol/L (ref 3.5–5.3)
Sodium: 136 mmol/L (ref 135–146)
Total Bilirubin: 0.6 mg/dL (ref 0.2–1.2)
Total Protein: 6.9 g/dL (ref 6.1–8.1)

## 2020-03-10 LAB — RPR: RPR Ser Ql: NONREACTIVE

## 2020-03-10 LAB — URINE CULTURE
MICRO NUMBER:: 10814094
SPECIMEN QUALITY:: ADEQUATE

## 2020-03-10 LAB — CERVICOVAGINAL ANCILLARY ONLY
Bacterial Vaginitis (gardnerella): POSITIVE — AB
Candida Glabrata: NEGATIVE
Candida Vaginitis: NEGATIVE
Chlamydia: NEGATIVE
Comment: NEGATIVE
Comment: NEGATIVE
Comment: NEGATIVE
Comment: NEGATIVE
Comment: NEGATIVE
Comment: NORMAL
Neisseria Gonorrhea: NEGATIVE
Trichomonas: NEGATIVE

## 2020-03-10 LAB — HIV ANTIBODY (ROUTINE TESTING W REFLEX): HIV 1&2 Ab, 4th Generation: NONREACTIVE

## 2020-03-10 LAB — HSV(HERPES SIMPLEX VRS) I + II AB-IGG
HAV 1 IGG,TYPE SPECIFIC AB: <0.9 index
HSV 2 IGG,TYPE SPECIFIC AB: <0.9 index

## 2020-03-10 MED ORDER — METRONIDAZOLE 500 MG PO TABS
500.0000 mg | ORAL_TABLET | Freq: Two times a day (BID) | ORAL | 0 refills | Status: DC
Start: 2020-03-10 — End: 2021-10-05

## 2020-03-10 NOTE — Telephone Encounter (Signed)
Please advise 

## 2020-03-11 ENCOUNTER — Other Ambulatory Visit: Payer: Self-pay

## 2020-03-11 DIAGNOSIS — R300 Dysuria: Secondary | ICD-10-CM

## 2020-03-18 ENCOUNTER — Telehealth: Payer: Self-pay | Admitting: Physician Assistant

## 2020-03-18 NOTE — Telephone Encounter (Signed)
Patient called stating she was seen by Jarold Motto on 8/11.  Patient completed CT.  Several kidney stones were found.  Patient is waiting for referral to go through with [facility].  Would like to know what if anything she can take for pain?

## 2020-03-18 NOTE — Telephone Encounter (Signed)
Should I send this to sam?

## 2020-03-18 NOTE — Telephone Encounter (Signed)
Pt called back, asked her how she is feeling? Pt said not great she just left the Urologist office, she said he didn't even discuss the stones just her pain. Asked pt what medications he gave her? Pt said she does not know will pickup after work. Asked her where her pain is? Pt said left flank area. Any nausea or vomiting? Pt said no. Told pt if he did not give her anything for pain and needs something to let us know. If symptoms worsen need to go to Urgent care or ED. Pt verbalized understanding.

## 2020-03-18 NOTE — Telephone Encounter (Signed)
Sending to evaluating/treating provider first to review.

## 2020-03-18 NOTE — Telephone Encounter (Signed)
Please call and document patients symptoms. Any red flags -- needs to go to urgent care or ER.  Offer flomax and tramadol -- I will send in tramadol after you speak with patient.

## 2020-03-18 NOTE — Telephone Encounter (Signed)
Tried to contact pt mailbox is full unable to leave message. 

## 2020-06-28 ENCOUNTER — Other Ambulatory Visit: Payer: Self-pay

## 2020-06-28 ENCOUNTER — Encounter (HOSPITAL_COMMUNITY): Payer: Self-pay | Admitting: Psychiatry

## 2020-06-28 ENCOUNTER — Telehealth (INDEPENDENT_AMBULATORY_CARE_PROVIDER_SITE_OTHER): Payer: 59 | Admitting: Psychiatry

## 2020-06-28 DIAGNOSIS — F410 Panic disorder [episodic paroxysmal anxiety] without agoraphobia: Secondary | ICD-10-CM

## 2020-06-28 MED ORDER — ESCITALOPRAM OXALATE 20 MG PO TABS: 20.0000 mg | ORAL_TABLET | Freq: Every day | ORAL | 2 refills | Status: DC

## 2020-06-28 MED ORDER — ALPRAZOLAM 0.5 MG PO TABS: ORAL_TABLET | ORAL | 2 refills | Status: DC

## 2020-06-28 NOTE — Progress Notes (Signed)
Virtual Visit via Video Note  I connected with Kayla Sanchez on 06/28/20 at 10:40 AM EST by a video enabled telemedicine application and verified that I am speaking with the correct person using two identifiers.  Location: Patient: home Provider: home   I discussed the limitations of evaluation and management by telemedicine and the availability of in person appointments. The patient expressed understanding and agreed to proceed.   I discussed the assessment and treatment plan with the patient. The patient was provided an opportunity to ask questions and all were answered. The patient agreed with the plan and demonstrated an understanding of the instructions.   The patient was advised to call back or seek an in-person evaluation if the symptoms worsen or if the condition fails to improve as anticipated.  I provided 15 minutes of non-face-to-face time during this encounter.   [name], MD  [facility] MD/PA/NP OP Progress Note  06/28/2020 10:54 AM Kayla Sanchez  MRN:  [id number]  Chief Complaint:  Chief Complaint    Depression; Anxiety; Follow-up     HPI: This patient is a 24 year old single white female who lives with her 2 children in [place].  She has been working as is scheduled for medical office but is currently unemployed.  The patient returns for follow-up after 3 months.  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  She is trying to find other work.  She is not wanting to look in a major Medical Center because she is still hesitant to get the coronavirus vaccine.  We discussed this at length today.  Overall however her mood has been fairly good and her anxiety is under good control.  She generally is sleeping well.  She denies any thoughts of self-harm or suicide. Visit Diagnosis:    ICD-10-CM   1. Panic disorder  F41.0 escitalopram (LEXAPRO) 20 MG tablet    Past Psychiatric History:  none  Past Medical History:  Past Medical History:  Diagnosis Date  . Anxiety   . Depression   . GERD (gastroesophageal reflux disease)     Past Surgical History:  Procedure Laterality Date  . WISDOM TOOTH EXTRACTION      Family Psychiatric History: see below  Family History:  Family History  Problem Relation Age of Onset  . Depression Mother   . Anxiety disorder Mother   . Drug abuse Father   . Anxiety disorder Maternal Aunt   . Alcohol abuse Maternal Grandfather   . Anxiety disorder Maternal Grandfather     Social History:  Social History   Socioeconomic History  . Marital status: Single    Spouse name: Not on file  . Number of children: Not on file  . Years of education: Not on file  . Highest education level: Not on file  Occupational History  . Not on file  Tobacco Use  . Smoking status: Current Every Day Smoker    Packs/day: 1.00    Years: 4.00    Pack years: 4.00    Types: Cigarettes  . Smokeless tobacco: Never Used  Vaping Use  . Vaping Use: Former  Substance and Sexual Activity  . Alcohol use: Yes    Comment: 3 beers 1-2 x per week  . Drug use: No    Comment: 05-21-2016 per pt 2 yrs ago  . Sexual activity: Yes    Birth control/protection: None  Other Topics Concern  .  Not on file  Social History Narrative  . Not on file   Social Determinants of Health   Financial Resource Strain:   . Difficulty of Paying Living Expenses: Not on file  Food Insecurity:   . Worried About Programme researcher, broadcasting/film/video in the Last Year: Not on file  . Ran Out of Food in the Last Year: Not on file  Transportation Needs:   . Lack of Transportation (Medical): Not on file  . Lack of Transportation (Non-Medical): Not on file  Physical Activity:   . Days of Exercise per Week: Not on file  . Minutes of Exercise per Session: Not on file  Stress:   . Feeling of Stress : Not on file  Social Connections:   . Frequency of Communication with Friends and Family: Not on file  .  Frequency of Social Gatherings with Friends and Family: Not on file  . Attends Religious Services: Not on file  . Active Member of Clubs or Organizations: Not on file  . Attends Banker Meetings: Not on file  . Marital Status: Not on file    Allergies:  Allergies  Allergen Reactions  . Ciprofloxacin Other (See Comments)    Tendonitis   . Penicillins Hives    Has patient had a PCN reaction causing immediate rash, facial/tongue/throat swelling, SOB or lightheadedness with hypotension: No Has patient had a PCN reaction causing severe rash involving mucus membranes or skin necrosis: No Has patient had a PCN reaction that required hospitalization No Has patient had a PCN reaction occurring within the last 10 years: No If all of the above answers are "NO", then may proceed with Cephalosporin use.   . Nitrofurantoin Itching    Metabolic Disorder Labs: No results found for: HGBA1C, MPG No results found for: PROLACTIN No results found for: CHOL, TRIG, HDL, CHOLHDL, VLDL, LDLCALC Lab Results  Component Value Date   TSH 0.635 07/31/2016    Therapeutic Level Labs: No results found for: LITHIUM No results found for: VALPROATE No components found for:  CBMZ  Current Medications: Current Outpatient Medications  Medication Sig Dispense Refill  . ALPRAZolam (XANAX) 0.5 MG tablet TAKE 1 TABLET BY MOUTH THREE TIMES DAILY AS NEEDED FOR  SLEEP  OR  ANXIETY 90 tablet 2  . escitalopram (LEXAPRO) 20 MG tablet Take 1 tablet (20 mg total) by mouth daily. 90 tablet 2  . fluticasone (FLONASE) 50 MCG/ACT nasal spray Place 2 sprays into both nostrils daily. (Patient not taking: Reported on 03/09/2020) 16 g 6  . hydrochlorothiazide (HYDRODIURIL) 12.5 MG tablet Take 1 tablet (12.5 mg total) by mouth daily. (Patient not taking: Reported on 03/09/2020) 30 tablet 1  . levonorgestrel (LILETTA, 52 MG,) 19.5 MCG/DAY IUD IUD 1 each by Intrauterine route once.    . metroNIDAZOLE (FLAGYL) 500 MG tablet  Take 1 tablet (500 mg total) by mouth 2 (two) times daily. 14 tablet 0  . omeprazole (PRILOSEC) 20 MG capsule Take 1 capsule (20 mg total) by mouth daily. 30 capsule 3   No current facility-administered medications for this visit.     Musculoskeletal: Strength & Muscle Tone: within normal limits Gait & Station: normal Patient leans: N/A  Psychiatric Specialty Exam: Review of Systems  All other systems reviewed and are negative.   There were no vitals taken for this visit.There is no height or weight on file to calculate BMI.  General Appearance: Casual and Fairly Groomed  Eye Contact:  Good  Speech:  Clear and Coherent  Volume:  Normal  Mood:  Euthymic  Affect:  Appropriate and Congruent  Thought Process:  Goal Directed  Orientation:  Full (Time, Place, and Person)  Thought Content: WDL   Suicidal Thoughts:  No  Homicidal Thoughts:  No  Memory:  Immediate;   Good Recent;   Good Remote;   Good  Judgement:  Good  Insight:  Fair  Psychomotor Activity:  Normal  Concentration:  Concentration: Good and Attention Span: Good  Recall:  Good  Fund of Knowledge: Good  Language: Good  Akathisia:  No  Handed:  Right  AIMS (if indicated): not done  Assets:  Communication Skills Desire for Improvement Physical Health Resilience Social Support Talents/Skills  ADL's:  Intact  Cognition: WNL  Sleep:  Good   Screenings: GAD-7     Office Visit from 04/19/2016 in [facility] Office Visit from 02/28/2016 in [facility]  Total GAD-7 Score 20 19    PHQ2-9     Office Visit from 02/25/2020 in [facility] Office Visit from 10/12/2019 in [facility] Office Visit from 02/10/2018 in [facility] Office Visit from 10/03/2017 in [facility] Office Visit from 07/18/2017 in [facility]  PHQ-2 Total Score 0 1 0  0 0  PHQ-9 Total Score 0 1 -- -- --       Assessment and Plan: This patient is a 24 year old female with a history of anxiety depression.  For the most part she is doing well.  She will continue Lexapro 20 mg daily for depression and anxiety and Xanax 0.5 mg up to 3 times daily as needed for breakthrough anxiety.  She will return to see me in 3 months   [name], MD 06/28/2020, 10:54 AM

## 2020-07-15 ENCOUNTER — Telehealth (HOSPITAL_COMMUNITY): Payer: 59 | Admitting: Psychiatry

## 2020-08-05 ENCOUNTER — Other Ambulatory Visit (HOSPITAL_COMMUNITY): Payer: Self-pay | Admitting: Psychiatry

## 2020-08-08 ENCOUNTER — Telehealth (HOSPITAL_COMMUNITY): Payer: Self-pay | Admitting: *Deleted

## 2020-08-08 NOTE — Telephone Encounter (Signed)
Opened in Error.

## 2020-08-09 ENCOUNTER — Other Ambulatory Visit (HOSPITAL_COMMUNITY): Payer: Self-pay | Admitting: Psychiatry

## 2020-08-09 MED ORDER — ALPRAZOLAM 0.5 MG PO TABS: ORAL_TABLET | ORAL | 2 refills | Status: DC

## 2020-09-20 ENCOUNTER — Other Ambulatory Visit: Payer: Self-pay

## 2020-09-20 ENCOUNTER — Telehealth (INDEPENDENT_AMBULATORY_CARE_PROVIDER_SITE_OTHER): Payer: Self-pay | Admitting: Psychiatry

## 2020-09-20 ENCOUNTER — Telehealth (HOSPITAL_COMMUNITY): Payer: Self-pay | Admitting: *Deleted

## 2020-09-20 ENCOUNTER — Encounter (HOSPITAL_COMMUNITY): Payer: Self-pay | Admitting: Psychiatry

## 2020-09-20 DIAGNOSIS — F410 Panic disorder [episodic paroxysmal anxiety] without agoraphobia: Secondary | ICD-10-CM

## 2020-09-20 MED ORDER — ESCITALOPRAM OXALATE 20 MG PO TABS: 30.0000 mg | ORAL_TABLET | Freq: Every day | ORAL | 2 refills | Status: DC

## 2020-09-20 MED ORDER — ALPRAZOLAM 0.5 MG PO TABS: 0.5000 mg | ORAL_TABLET | Freq: Two times a day (BID) | ORAL | 2 refills | Status: DC | PRN

## 2020-09-20 NOTE — Progress Notes (Signed)
Virtual Visit via Telephone Note  I connected with Kayla Sanchez on 09/20/20 at  1:20 PM EST by telephone and verified that I am speaking with the correct person using two identifiers.  Location: Patient: home Provider: office   I discussed the limitations, risks, security and privacy concerns of performing an evaluation and management service by telephone and the availability of in person appointments. I also discussed with the patient that there may be a patient responsible charge related to this service. The patient expressed understanding and agreed to proceed.    I discussed the assessment and treatment plan with the patient. The patient was provided an opportunity to ask questions and all were answered. The patient agreed with the plan and demonstrated an understanding of the instructions.   The patient was advised to call back or seek an in-person evaluation if the symptoms worsen or if the condition fails to improve as anticipated.  I provided 15 minutes of non-face-to-face time during this encounter.   [name], MD  [facility] MD/PA/NP OP Progress Note  09/20/2020 1:40 PM Kayla Sanchez  MRN:  [id number]  Chief Complaint:  Chief Complaint    Anxiety; Follow-up     HPI: This patient is a 25 year old single white female who lives with her 2 children in [place]. 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  The patient returns for follow-up after 3 months. She states that she recently got a new job and really likes it. Her children both had coronavirus which was stressful but they are doing somewhat better now. She had it in the beginning of the pandemic but is still not vaccinated and does not plan to be.  The patient states her mood is fairly good. She finds however that she has been more anxious lately. We discussed going up a little bit on the Lexapro to help with this. She is generally sleeping well denies  suicidal ideation. She only uses the Xanax occasionally and only up to about twice a day at most so we will cut down the quantity per month Visit Diagnosis:    ICD-10-CM   1. Panic disorder  F41.0 escitalopram (LEXAPRO) 20 MG tablet    Past Psychiatric History: none  Past Medical History:  Past Medical History:  Diagnosis Date  . Anxiety   . Depression   . GERD (gastroesophageal reflux disease)     Past Surgical History:  Procedure Laterality Date  . WISDOM TOOTH EXTRACTION      Family Psychiatric History: see below  Family History:  Family History  Problem Relation Age of Onset  . Depression Mother   . Anxiety disorder Mother   . Drug abuse Father   . Anxiety disorder Maternal Aunt   . Alcohol abuse Maternal Grandfather   . Anxiety disorder Maternal Grandfather     Social History:  Social History   Socioeconomic History  . Marital status: Single    Spouse name: Not on file  . Number of children: Not on file  . Years of education: Not on file  . Highest education level: Not on file  Occupational History  . Not on file  Tobacco Use  . Smoking status: Current Every Day Smoker    Packs/day: 1.00    Years: 4.00    Pack years: 4.00    Types: Cigarettes  . Smokeless tobacco: Never Used  Vaping Use  . Vaping Use: Former  Substance and Sexual Activity  .  Alcohol use: Yes    Comment: 3 beers 1-2 x per week  . Drug use: No    Comment: 05-21-2016 per pt 2 yrs ago  . Sexual activity: Yes    Birth control/protection: None  Other Topics Concern  . Not on file  Social History Narrative  . Not on file   Social Determinants of Health   Financial Resource Strain: Not on file  Food Insecurity: Not on file  Transportation Needs: Not on file  Physical Activity: Not on file  Stress: Not on file  Social Connections: Not on file    Allergies:  Allergies  Allergen Reactions  . Ciprofloxacin Other (See Comments)    Tendonitis   . Penicillins Hives    Has patient  had a PCN reaction causing immediate rash, facial/tongue/throat swelling, SOB or lightheadedness with hypotension: No Has patient had a PCN reaction causing severe rash involving mucus membranes or skin necrosis: No Has patient had a PCN reaction that required hospitalization No Has patient had a PCN reaction occurring within the last 10 years: No If all of the above answers are "NO", then may proceed with Cephalosporin use.   . Nitrofurantoin Itching    Metabolic Disorder Labs: No results found for: HGBA1C, MPG No results found for: PROLACTIN No results found for: CHOL, TRIG, HDL, CHOLHDL, VLDL, LDLCALC Lab Results  Component Value Date   TSH 0.635 07/31/2016    Therapeutic Level Labs: No results found for: LITHIUM No results found for: VALPROATE No components found for:  CBMZ  Current Medications: Current Outpatient Medications  Medication Sig Dispense Refill  . ALPRAZolam (XANAX) 0.5 MG tablet Take 1 tablet (0.5 mg total) by mouth 2 (two) times daily as needed for anxiety. 60 tablet 2  . escitalopram (LEXAPRO) 20 MG tablet Take 1.5 tablets (30 mg total) by mouth daily. 135 tablet 2  . fluticasone (FLONASE) 50 MCG/ACT nasal spray Place 2 sprays into both nostrils daily. (Patient not taking: Reported on 03/09/2020) 16 g 6  . hydrochlorothiazide (HYDRODIURIL) 12.5 MG tablet Take 1 tablet (12.5 mg total) by mouth daily. (Patient not taking: Reported on 03/09/2020) 30 tablet 1  . levonorgestrel (LILETTA, 52 MG,) 19.5 MCG/DAY IUD IUD 1 each by Intrauterine route once.    . metroNIDAZOLE (FLAGYL) 500 MG tablet Take 1 tablet (500 mg total) by mouth 2 (two) times daily. 14 tablet 0  . omeprazole (PRILOSEC) 20 MG capsule Take 1 capsule (20 mg total) by mouth daily. 30 capsule 3   No current facility-administered medications for this visit.     Musculoskeletal: Strength & Muscle Tone: within normal limits Gait & Station: normal Patient leans: N/A  Psychiatric Specialty Exam: Review  of Systems  Psychiatric/Behavioral: The patient is nervous/anxious.   All other systems reviewed and are negative.   There were no vitals taken for this visit.There is no height or weight on file to calculate BMI.  General Appearance: NA  Eye Contact:  NA  Speech:  Clear and Coherent  Volume:  Normal  Mood:  Euthymic and anxious  Affect:  NA  Thought Process:  Goal Directed  Orientation:  Full (Time, Place, and Person)  Thought Content: WDL   Suicidal Thoughts:  No  Homicidal Thoughts:  No  Memory:  Immediate;   Good Recent;   Good Remote;   Fair  Judgement:  Good  Insight:  Fair  Psychomotor Activity:  Normal  Concentration:  Concentration: Good and Attention Span: Good  Recall:  Good  Fund of  Knowledge: Good  Language: Good  Akathisia:  No  Handed:  Right  AIMS (if indicated): not done  Assets:  Communication Skills Desire for Improvement Physical Health Resilience Social Support Talents/Skills Vocational/Educational  ADL's:  Intact  Cognition: WNL  Sleep:  Good   Screenings: GAD-7   Flowsheet Row Office Visit from 04/19/2016 in [facility] Office Visit from 02/28/2016 in [facility]  Total GAD-7 Score 20 19    PHQ2-9   Flowsheet Row Office Visit from 02/25/2020 in [facility] Office Visit from 10/12/2019 in [facility] Office Visit from 02/10/2018 in [facility] Office Visit from 10/03/2017 in [facility] Office Visit from 07/18/2017 in [facility]  PHQ-2 Total Score 0 1 0 0 0  PHQ-9 Total Score 0 1 -- -- --       Assessment and Plan: This patient is a 25 year old female with a history of anxiety. Her anxiety has been a little bit worse recently so we will increase Lexapro to 30 mg daily. She will continue Xanax 0.5 mg but only up to twice daily for breakthrough anxiety. She  will return to see me in 3 months or call sooner as needed.   [name], MD 09/20/2020, 1:40 PM

## 2020-09-20 NOTE — Telephone Encounter (Signed)
LMOM to call office back to sch f/u appt

## 2020-11-23 ENCOUNTER — Encounter: Payer: Medicaid Other | Admitting: Physician Assistant

## 2020-12-12 ENCOUNTER — Telehealth (INDEPENDENT_AMBULATORY_CARE_PROVIDER_SITE_OTHER): Payer: Self-pay | Admitting: Psychiatry

## 2020-12-12 ENCOUNTER — Other Ambulatory Visit: Payer: Self-pay

## 2020-12-12 ENCOUNTER — Encounter (HOSPITAL_COMMUNITY): Payer: Self-pay | Admitting: Psychiatry

## 2020-12-12 DIAGNOSIS — F410 Panic disorder [episodic paroxysmal anxiety] without agoraphobia: Secondary | ICD-10-CM

## 2020-12-12 MED ORDER — ESCITALOPRAM OXALATE 20 MG PO TABS
30.0000 mg | ORAL_TABLET | Freq: Two times a day (BID) | ORAL | 2 refills | Status: DC
Start: 2020-12-12 — End: 2021-03-22

## 2020-12-12 MED ORDER — ALPRAZOLAM 0.5 MG PO TABS: 0.5000 mg | ORAL_TABLET | Freq: Three times a day (TID) | ORAL | 2 refills | Status: DC | PRN

## 2020-12-12 NOTE — Progress Notes (Signed)
Virtual Visit via Telephone Note  I connected with Kayla Sanchez on 12/12/20 at  1:20 PM EDT by telephone and verified that I am speaking with the correct person using two identifiers.  Location: Patient: home Provider: home office   I discussed the limitations, risks, security and privacy concerns of performing an evaluation and management service by telephone and the availability of in person appointments. I also discussed with the patient that there may be a patient responsible charge related to this service. The patient expressed understanding and agreed to proceed.     I discussed the assessment and treatment plan with the patient. The patient was provided an opportunity to ask questions and all were answered. The patient agreed with the plan and demonstrated an understanding of the instructions.   The patient was advised to call back or seek an in-person evaluation if the symptoms worsen or if the condition fails to improve as anticipated.  I provided 15 minutes of non-face-to-face time during this encounter.   [name], MD  [facility] MD/PA/NP OP Progress Note  12/12/2020 1:35 PM Kayla Sanchez  MRN:  [id number]  Chief Complaint:  Chief Complaint    Depression; Anxiety; Follow-up     HPI: This patient is a 25 year old single white female who lives with her 2 children in [place].  She is currently unemployed.  The patient returns for follow-up after 3 months regarding her anxiety and depression.  She states that she got very sick in March.  It started out as tonsillitis but eventually was diagnosed as mononucleosis.  She missed 4 weeks of work and had to quit her job.  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  The patient states that she has been more anxious recently.  Her mood is still somewhat erratic but more depressed than anything else.  She is still quite fatigued from having mono and I explained that it  might take up to 6 months to recover from this.  I suggested we go up on the Lexapro and the Xanax for now and she agrees.  She denies any thoughts of self-harm or suicidal ideation Visit Diagnosis:    ICD-10-CM   1. Panic disorder  F41.0 escitalopram (LEXAPRO) 20 MG tablet    Past Psychiatric History: none  Past Medical History:  Past Medical History:  Diagnosis Date  . Anxiety   . Depression   . GERD (gastroesophageal reflux disease)     Past Surgical History:  Procedure Laterality Date  . WISDOM TOOTH EXTRACTION      Family Psychiatric History:see below  Family History:  Family History  Problem Relation Age of Onset  . Depression Mother   . Anxiety disorder Mother   . Drug abuse Father   . Anxiety disorder Maternal Aunt   . Alcohol abuse Maternal Grandfather   . Anxiety disorder Maternal Grandfather     Social History:  Social History   Socioeconomic History  . Marital status: Single    Spouse name: Not on file  . Number of children: Not on file  . Years of education: Not on file  . Highest education level: Not on file  Occupational History  . Not on file  Tobacco Use  . Smoking status: Current Every Day Smoker    Packs/day: 1.00    Years: 4.00    Pack years: 4.00    Types: Cigarettes  . Smokeless tobacco: Never Used  Vaping Use  .  Vaping Use: Former  Substance and Sexual Activity  . Alcohol use: Yes    Comment: 3 beers 1-2 x per week  . Drug use: No    Comment: 05-21-2016 per pt 2 yrs ago  . Sexual activity: Yes    Birth control/protection: None  Other Topics Concern  . Not on file  Social History Narrative  . Not on file   Social Determinants of Health   Financial Resource Strain: Not on file  Food Insecurity: Not on file  Transportation Needs: Not on file  Physical Activity: Not on file  Stress: Not on file  Social Connections: Not on file    Allergies:  Allergies  Allergen Reactions  . Ciprofloxacin Other (See Comments)     Tendonitis   . Penicillins Hives    Has patient had a PCN reaction causing immediate rash, facial/tongue/throat swelling, SOB or lightheadedness with hypotension: No Has patient had a PCN reaction causing severe rash involving mucus membranes or skin necrosis: No Has patient had a PCN reaction that required hospitalization No Has patient had a PCN reaction occurring within the last 10 years: No If all of the above answers are "NO", then may proceed with Cephalosporin use.   . Nitrofurantoin Itching    Metabolic Disorder Labs: No results found for: HGBA1C, MPG No results found for: PROLACTIN No results found for: CHOL, TRIG, HDL, CHOLHDL, VLDL, LDLCALC Lab Results  Component Value Date   TSH 0.635 07/31/2016    Therapeutic Level Labs: No results found for: LITHIUM No results found for: VALPROATE No components found for:  CBMZ  Current Medications: Current Outpatient Medications  Medication Sig Dispense Refill  . ALPRAZolam (XANAX) 0.5 MG tablet Take 1 tablet (0.5 mg total) by mouth 3 (three) times daily as needed for anxiety. 90 tablet 2  . escitalopram (LEXAPRO) 20 MG tablet Take 1.5 tablets (30 mg total) by mouth 2 (two) times daily. 180 tablet 2  . fluticasone (FLONASE) 50 MCG/ACT nasal spray Place 2 sprays into both nostrils daily. (Patient not taking: Reported on 03/09/2020) 16 g 6  . hydrochlorothiazide (HYDRODIURIL) 12.5 MG tablet Take 1 tablet (12.5 mg total) by mouth daily. (Patient not taking: Reported on 03/09/2020) 30 tablet 1  . levonorgestrel (LILETTA, 52 MG,) 19.5 MCG/DAY IUD IUD 1 each by Intrauterine route once.    . metroNIDAZOLE (FLAGYL) 500 MG tablet Take 1 tablet (500 mg total) by mouth 2 (two) times daily. 14 tablet 0  . omeprazole (PRILOSEC) 20 MG capsule Take 1 capsule (20 mg total) by mouth daily. 30 capsule 3   No current facility-administered medications for this visit.     Musculoskeletal: Strength & Muscle Tone: within normal limits Gait & Station:  normal Patient leans: N/A  Psychiatric Specialty Exam: Review of Systems  Constitutional: Positive for fatigue.  Psychiatric/Behavioral: The patient is nervous/anxious.   All other systems reviewed and are negative.   There were no vitals taken for this visit.There is no height or weight on file to calculate BMI.  General Appearance: NA  Eye Contact:  NA  Speech:  Clear and Coherent  Volume:  Normal  Mood:  Anxious  Affect:  NA  Thought Process:  Goal Directed  Orientation:  Full (Time, Place, and Person)  Thought Content: Rumination   Suicidal Thoughts:  No  Homicidal Thoughts:  No  Memory:  Immediate;   Good Recent;   Good Remote;   Good  Judgement:  Good  Insight:  Fair  Psychomotor Activity:  Decreased  Concentration:  Concentration: Good and Attention Span: Good  Recall:  Good  Fund of Knowledge: Good  Language: Good  Akathisia:  No  Handed:  Right  AIMS (if indicated): not done  Assets:  Communication Skills Desire for Improvement Physical Health Resilience Social Support Talents/Skills  ADL's:  Intact  Cognition: WNL  Sleep:  Good   Screenings: GAD-7   Flowsheet Row Office Visit from 04/19/2016 in [facility] Office Visit from 02/28/2016 in [facility]  Total GAD-7 Score 20 19    PHQ2-9   Flowsheet Row [facility] Office Visit from 02/25/2020 in [facility] Office Visit from 10/12/2019 in [facility] Office Visit from 02/10/2018 in [facility] Office Visit from 10/03/2017 in [facility]  PHQ-2 Total Score 2 0 1 0 0  PHQ-9 Total Score 9 0 1 -- --    Flowsheet Row Video [facility]12/2020 [facility]  C-SSRS RISK CATEGORY No Risk       Assessment and Plan: Patient is a  24 year old female with a history of anxiety.  Again her anxiety has become a bit worse since she had mono.  We will increase Lexapro to 40 mg daily and continue Xanax but increase to 0.5 mg 3 times daily.  She will return to see me in 2 months   [name], MD 12/12/2020, 1:35 PM

## 2020-12-14 ENCOUNTER — Encounter: Payer: Self-pay | Admitting: Physician Assistant

## 2020-12-29 DIAGNOSIS — N760 Acute vaginitis: Secondary | ICD-10-CM | POA: Diagnosis not present

## 2020-12-29 DIAGNOSIS — Z6836 Body mass index (BMI) 36.0-36.9, adult: Secondary | ICD-10-CM | POA: Diagnosis not present

## 2020-12-29 DIAGNOSIS — Z01419 Encounter for gynecological examination (general) (routine) without abnormal findings: Secondary | ICD-10-CM | POA: Diagnosis not present

## 2021-01-15 IMAGING — CR CHEST - 2 VIEW
2 series · 2 of 2 positions shown · non-contrast
Comparison: None.

CLINICAL DATA: Left chest and arm pain for the past 4 days. Smoker.

EXAM:
CHEST - 2 VIEW

[chest pa]
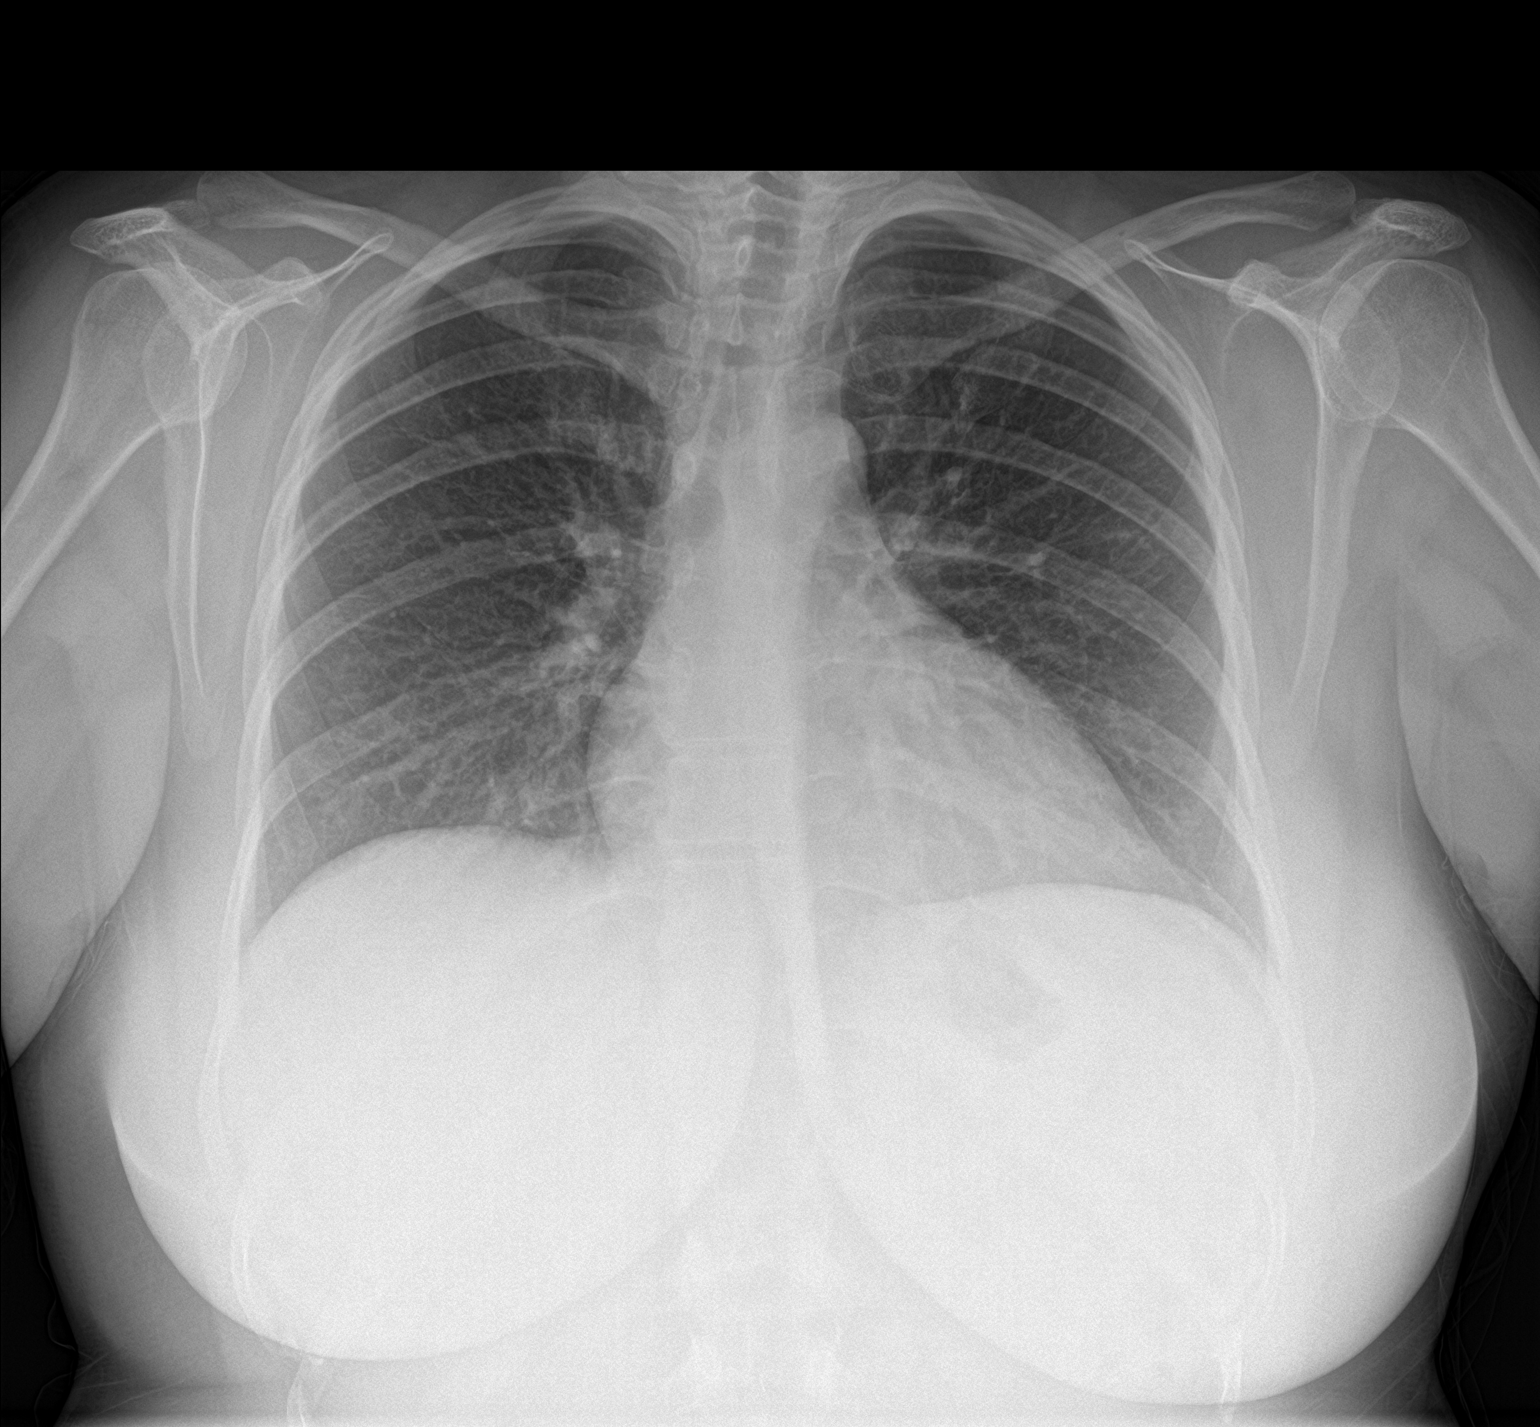

[chest lat]
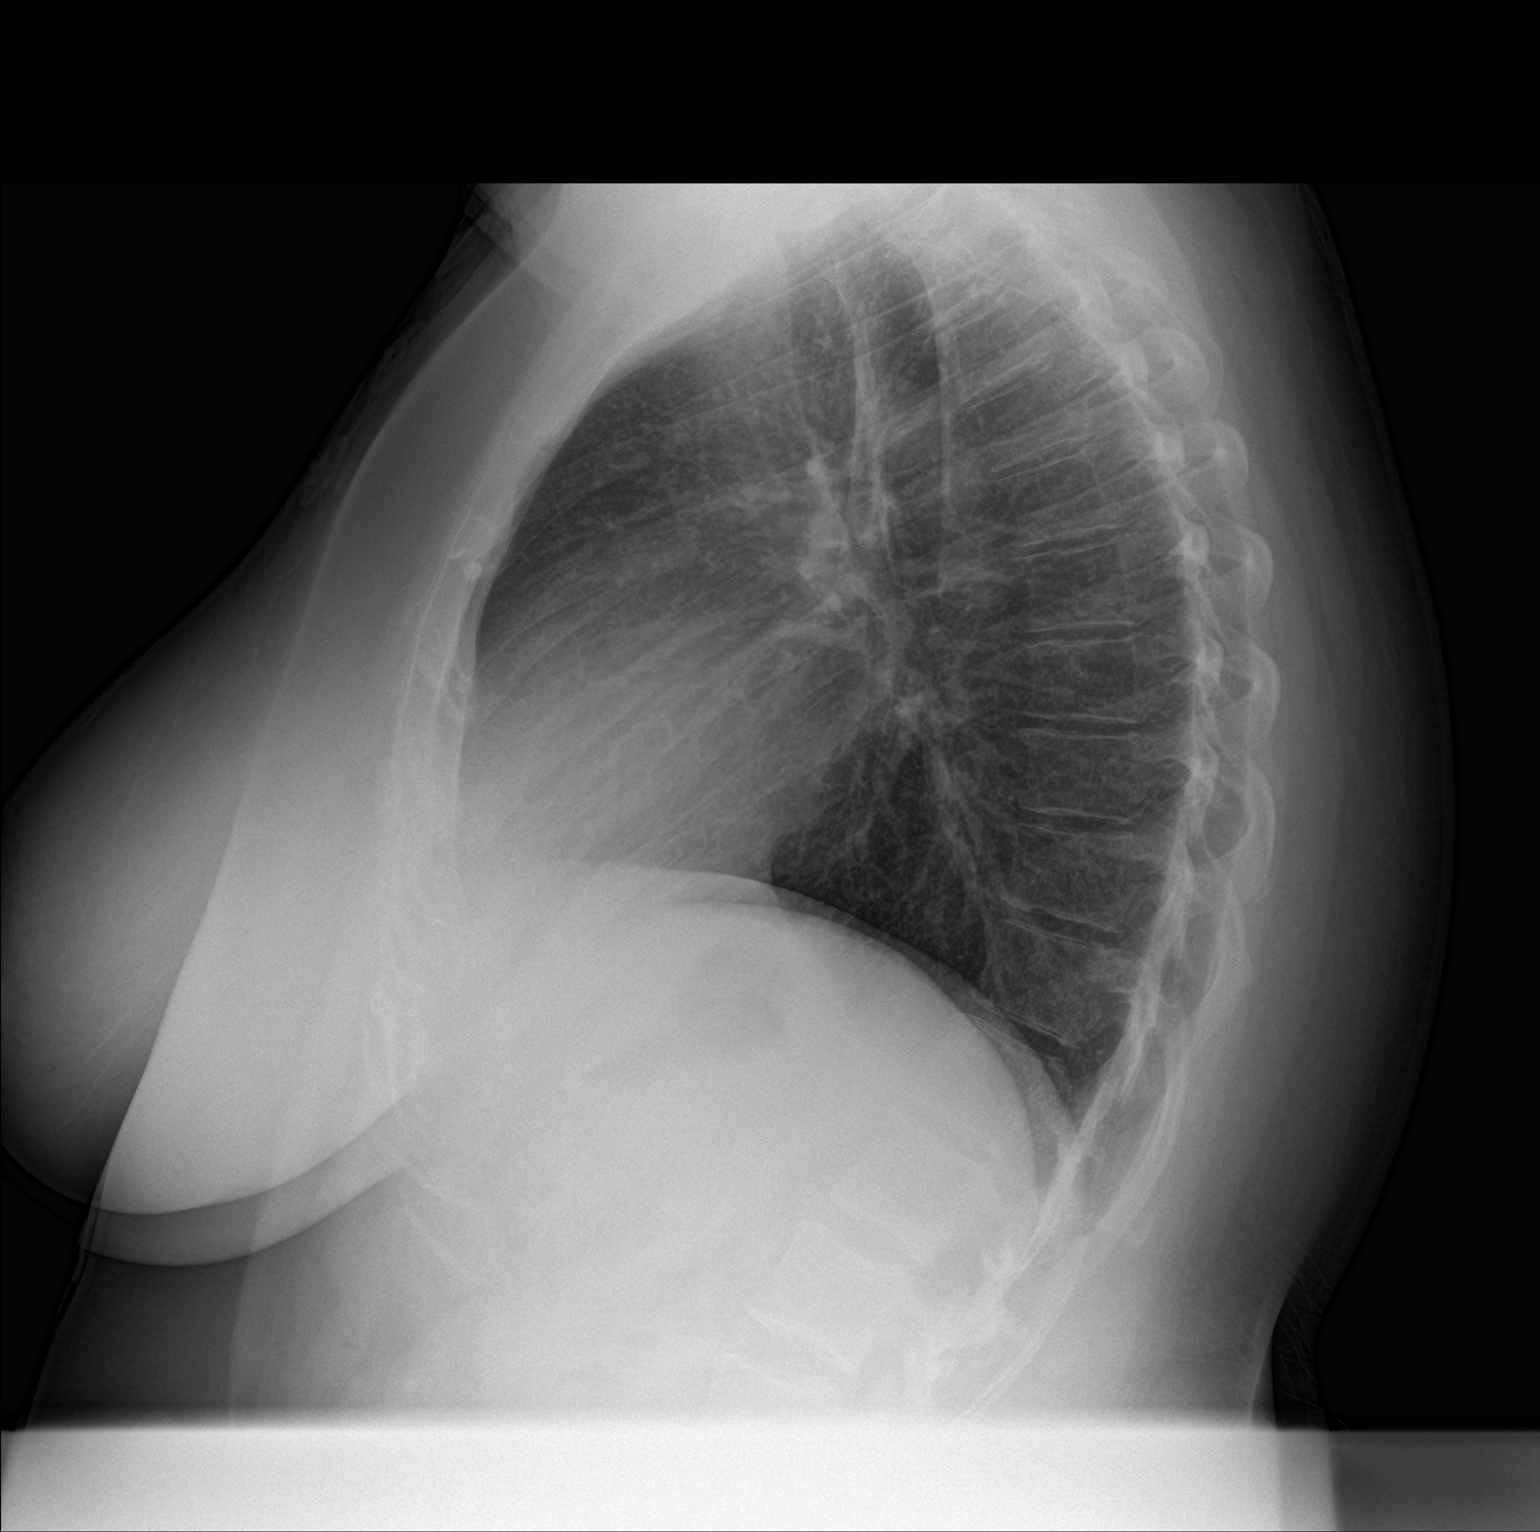

[2 of 2 positions shown; findings below may reference images not displayed]

FINDINGS: Normal sized heart. Clear lungs. Minimal diffuse peribronchial
thickening and accentuation of the interstitial markings. Mild
dextroconvex thoracolumbar scoliosis and minimal thoracic spine
degenerative changes.
IMPRESSION: No acute abnormality. Minimal chronic bronchitic changes.

## 2021-01-19 DIAGNOSIS — R87610 Atypical squamous cells of undetermined significance on cytologic smear of cervix (ASC-US): Secondary | ICD-10-CM | POA: Diagnosis not present

## 2021-01-19 DIAGNOSIS — N888 Other specified noninflammatory disorders of cervix uteri: Secondary | ICD-10-CM | POA: Diagnosis not present

## 2021-01-19 DIAGNOSIS — R87810 Cervical high risk human papillomavirus (HPV) DNA test positive: Secondary | ICD-10-CM | POA: Diagnosis not present

## 2021-02-03 DIAGNOSIS — N200 Calculus of kidney: Secondary | ICD-10-CM | POA: Diagnosis not present

## 2021-03-22 ENCOUNTER — Other Ambulatory Visit (HOSPITAL_COMMUNITY): Payer: Self-pay | Admitting: Psychiatry

## 2021-03-22 ENCOUNTER — Telehealth (HOSPITAL_COMMUNITY): Payer: Self-pay | Admitting: *Deleted

## 2021-03-22 DIAGNOSIS — F410 Panic disorder [episodic paroxysmal anxiety] without agoraphobia: Secondary | ICD-10-CM

## 2021-03-22 MED ORDER — ESCITALOPRAM OXALATE 20 MG PO TABS: 30.0000 mg | ORAL_TABLET | Freq: Two times a day (BID) | ORAL | 2 refills | Status: DC

## 2021-03-22 MED ORDER — ALPRAZOLAM 0.5 MG PO TABS: 0.5000 mg | ORAL_TABLET | Freq: Three times a day (TID) | ORAL | 2 refills | Status: DC | PRN

## 2021-03-22 NOTE — Telephone Encounter (Signed)
Patient called requesting refills for her Lexapro and Xanax. Pt f/u is 03-05-2021

## 2021-03-22 NOTE — Telephone Encounter (Signed)
sent 

## 2021-04-05 ENCOUNTER — Encounter (HOSPITAL_COMMUNITY): Payer: Self-pay | Admitting: Psychiatry

## 2021-04-05 ENCOUNTER — Telehealth (INDEPENDENT_AMBULATORY_CARE_PROVIDER_SITE_OTHER): Payer: BC Managed Care – PPO | Admitting: Psychiatry

## 2021-04-05 ENCOUNTER — Other Ambulatory Visit: Payer: Self-pay

## 2021-04-05 DIAGNOSIS — F410 Panic disorder [episodic paroxysmal anxiety] without agoraphobia: Secondary | ICD-10-CM | POA: Diagnosis not present

## 2021-04-05 MED ORDER — ESCITALOPRAM OXALATE 20 MG PO TABS
20.0000 mg | ORAL_TABLET | Freq: Every day | ORAL | 2 refills | Status: DC
Start: 2021-04-05 — End: 2021-04-11

## 2021-04-05 MED ORDER — ALPRAZOLAM 0.5 MG PO TABS: 0.5000 mg | ORAL_TABLET | Freq: Three times a day (TID) | ORAL | 2 refills | Status: DC | PRN

## 2021-04-05 NOTE — Progress Notes (Signed)
Virtual Visit via Telephone Note  I connected with Kayla Sanchez on 04/05/21 at 10:20 AM EDT by telephone and verified that I am speaking with the correct person using two identifiers.  Location: Patient: home Provider: home office   I discussed the limitations, risks, security and privacy concerns of performing an evaluation and management service by telephone and the availability of in person appointments. I also discussed with the patient that there may be a patient responsible charge related to this service. The patient expressed understanding and agreed to proceed.      I discussed the assessment and treatment plan with the patient. The patient was provided an opportunity to ask questions and all were answered. The patient agreed with the plan and demonstrated an understanding of the instructions.   The patient was advised to call back or seek an in-person evaluation if the symptoms worsen or if the condition fails to improve as anticipated.  I provide 14 minutes of non-face-to-face time during this encounter.   [name], MD  [facility] MD/PA/NP OP Progress Note  04/05/2021 10:42 AM Kayla Sanchez  MRN:  [id number]  Chief Complaint:  Chief Complaint   Depression; Anxiety; Follow-up    HPI: This patient is a 25 year old single white female who lives with her 2 children in [place].  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  The patient returns for follow-up after 3 months.  She states for the most part she is doing okay.  Last time we tried to increase her Lexapro but her insurance company would not approve it.  She remains on 20 mg daily.  She states that she feels well most of the time but about once a week she gets pretty sad.  She has not been suicidal.  Her energy is good and she is sleeping well.  I would still like to get it approved if we can because I like her to have less "sad days."  Her anxiety is under fairly good  control and the Xanax is helpful but she only takes it as needed. Visit Diagnosis:    ICD-10-CM   1. Panic disorder  F41.0 escitalopram (LEXAPRO) 20 MG tablet      Past Psychiatric History: none  Past Medical History:  Past Medical History:  Diagnosis Date   Anxiety    Depression    GERD (gastroesophageal reflux disease)     Past Surgical History:  Procedure Laterality Date   WISDOM TOOTH EXTRACTION      Family Psychiatric History: see below  Family History:  Family History  Problem Relation Age of Onset   Depression Mother    Anxiety disorder Mother    Drug abuse Father    Anxiety disorder Maternal Aunt    Alcohol abuse Maternal Grandfather    Anxiety disorder Maternal Grandfather     Social History:  Social History   Socioeconomic History   Marital status: Single    Spouse name: Not on file   Number of children: Not on file   Years of education: Not on file   Highest education level: Not on file  Occupational History   Not on file  Tobacco Use   Smoking status: Every Day    Packs/day: 1.00    Years: 4.00    Pack years: 4.00    Types: Cigarettes   Smokeless tobacco: Never  Vaping Use   Vaping Use: Former  Substance and Sexual Activity   Alcohol use: Yes  Comment: 3 beers 1-2 x per week   Drug use: No    Comment: 05-21-2016 per pt 2 yrs ago   Sexual activity: Yes    Birth control/protection: None  Other Topics Concern   Not on file  Social History Narrative   Not on file   Social Determinants of Health   Financial Resource Strain: Not on file  Food Insecurity: Not on file  Transportation Needs: Not on file  Physical Activity: Not on file  Stress: Not on file  Social Connections: Not on file    Allergies:  Allergies  Allergen Reactions   Ciprofloxacin Other (See Comments)    Tendonitis    Penicillins Hives    Has patient had a PCN reaction causing immediate rash, facial/tongue/throat swelling, SOB or lightheadedness with hypotension:  No Has patient had a PCN reaction causing severe rash involving mucus membranes or skin necrosis: No Has patient had a PCN reaction that required hospitalization No Has patient had a PCN reaction occurring within the last 10 years: No If all of the above answers are "NO", then may proceed with Cephalosporin use.    Nitrofurantoin Itching    Metabolic Disorder Labs: No results found for: HGBA1C, MPG No results found for: PROLACTIN No results found for: CHOL, TRIG, HDL, CHOLHDL, VLDL, LDLCALC Lab Results  Component Value Date   TSH 0.635 07/31/2016    Therapeutic Level Labs: No results found for: LITHIUM No results found for: VALPROATE No components found for:  CBMZ  Current Medications: Current Outpatient Medications  Medication Sig Dispense Refill   ALPRAZolam (XANAX) 0.5 MG tablet Take 1 tablet (0.5 mg total) by mouth 3 (three) times daily as needed for anxiety. 90 tablet 2   escitalopram (LEXAPRO) 20 MG tablet Take 1 tablet (20 mg total) by mouth daily. 45 tablet 2   fluticasone (FLONASE) 50 MCG/ACT nasal spray Place 2 sprays into both nostrils daily. (Patient not taking: Reported on 03/09/2020) 16 g 6   hydrochlorothiazide (HYDRODIURIL) 12.5 MG tablet Take 1 tablet (12.5 mg total) by mouth daily. (Patient not taking: Reported on 03/09/2020) 30 tablet 1   levonorgestrel (LILETTA, 52 MG,) 19.5 MCG/DAY IUD IUD 1 each by Intrauterine route once.     metroNIDAZOLE (FLAGYL) 500 MG tablet Take 1 tablet (500 mg total) by mouth 2 (two) times daily. 14 tablet 0   omeprazole (PRILOSEC) 20 MG capsule Take 1 capsule (20 mg total) by mouth daily. 30 capsule 3   No current facility-administered medications for this visit.     Musculoskeletal: Strength & Muscle Tone: na Gait & Station: na Patient leans: N/A  Psychiatric Specialty Exam: Review of Systems  Psychiatric/Behavioral:  Positive for dysphoric mood.   All other systems reviewed and are negative.  There were no vitals taken for  this visit.There is no height or weight on file to calculate BMI.  General Appearance: NA  Eye Contact:  NA  Speech:  Clear and Coherent  Volume:  Normal  Mood:  Euthymic today, but gets sad about once a week  Affect:  NA  Thought Process:  Goal Directed  Orientation:  Full (Time, Place, and Person)  Thought Content: WDL   Suicidal Thoughts:  No  Homicidal Thoughts:  No  Memory:  Immediate;   Good Recent;   Good Remote;   Good  Judgement:  Good  Insight:  Fair  Psychomotor Activity:  Normal  Concentration:  Concentration: Good and Attention Span: Good  Recall:  Good  Fund of Knowledge: Good  Language: Good  Akathisia:  No  Handed:  Right  AIMS (if indicated): not done  Assets:  Communication Skills Desire for Improvement Physical Health Resilience Social Support Talents/Skills  ADL's:  Intact  Cognition: WNL  Sleep:  Good   Screenings: GAD-7    Flowsheet Row Office Visit from 04/19/2016 in [facility] Office Visit from 02/28/2016 in [facility]  Total GAD-7 Score 20 19      PHQ2-9    Flowsheet Row [facility]20[facility] Video Visit from 12[facility]12/2020 in [facility] Office Visit from 02/25/2020 in [facility] Office Visit from 10/12/2019 in [facility] Office Visit from 02/10/2018 in [facility]  PHQ-2 Total Score 1 2 0 1 0  PHQ-9 Total Score -- 9 0 1 --      Flowsheet Row Video Visit from 04/05/2021 in [facility] Video Visit from 12/12/2020 in [facility]  C-SSRS RISK CATEGORY No Risk No Risk        Assessment and Plan: This patient is a 25 year old female with a history of anxiety and bit of depression.  We will try to increase her  Lexapro to 30 mg daily since she still has some down days.  She will continue Xanax 0.5 mg 3 times daily as needed for anxiety.  She will return to see me in 3 months   [name], MD 04/05/2021, 10:42 AM

## 2021-04-11 ENCOUNTER — Other Ambulatory Visit (HOSPITAL_COMMUNITY): Payer: Self-pay | Admitting: Psychiatry

## 2021-04-11 DIAGNOSIS — F410 Panic disorder [episodic paroxysmal anxiety] without agoraphobia: Secondary | ICD-10-CM

## 2021-04-11 MED ORDER — ESCITALOPRAM OXALATE 20 MG PO TABS: 30.0000 mg | ORAL_TABLET | Freq: Every day | ORAL | 2 refills | Status: DC

## 2021-05-02 DIAGNOSIS — Z30432 Encounter for removal of intrauterine contraceptive device: Secondary | ICD-10-CM | POA: Diagnosis not present

## 2021-07-05 ENCOUNTER — Encounter (HOSPITAL_COMMUNITY): Payer: Self-pay | Admitting: Psychiatry

## 2021-07-05 ENCOUNTER — Telehealth (INDEPENDENT_AMBULATORY_CARE_PROVIDER_SITE_OTHER): Payer: BC Managed Care – PPO | Admitting: Psychiatry

## 2021-07-05 ENCOUNTER — Other Ambulatory Visit: Payer: Self-pay

## 2021-07-05 DIAGNOSIS — F410 Panic disorder [episodic paroxysmal anxiety] without agoraphobia: Secondary | ICD-10-CM | POA: Diagnosis not present

## 2021-07-05 DIAGNOSIS — F324 Major depressive disorder, single episode, in partial remission: Secondary | ICD-10-CM

## 2021-07-05 MED ORDER — SERTRALINE HCL 50 MG PO TABS
50.0000 mg | ORAL_TABLET | Freq: Every day | ORAL | 2 refills | Status: DC
Start: 2021-07-05 — End: 2021-08-08

## 2021-07-05 MED ORDER — ALPRAZOLAM 0.5 MG PO TABS: 0.5000 mg | ORAL_TABLET | Freq: Three times a day (TID) | ORAL | 2 refills | Status: DC | PRN

## 2021-07-05 NOTE — Progress Notes (Signed)
Virtual Visit via Video Note  I connected with Kayla Sanchez on 07/05/21 at  9:00 AM EST by a video enabled telemedicine application and verified that I am speaking with the correct person using two identifiers.  Location: Patient: home Provider: office   I discussed the limitations of evaluation and management by telemedicine and the availability of in person appointments. The patient expressed understanding and agreed to proceed.      I discussed the assessment and treatment plan with the patient. The patient was provided an opportunity to ask questions and all were answered. The patient agreed with the plan and demonstrated an understanding of the instructions.   The patient was advised to call back or seek an in-person evaluation if the symptoms worsen or if the condition fails to improve as anticipated.  I provided 15 minutes of non-face-to-face time during this encounter.   [name], MD  [facility] MD/PA/NP OP Progress Note  07/05/2021 9:16 AM Kayla Sanchez  MRN:  [id number]  Chief Complaint: anxiety, depression HPI: This patient is a 25 year old single white female who lives with her 2 children in [place].  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  The patient returns for follow-up after 3 months.  She states that she is doing okay but still has periods of feeling down and depressed.  She cannot think of any particular reason.  She is enjoying her job.  Her children are doing well.  She is sleeping and eating well her energy is fairly good and she denies any thoughts of self-harm or suicide.  She has not seen much improvement from increasing the Lexapro.  Her anxiety is under good control with the Xanax.  Since the patient is still complaining of depressive periods we will try another antidepressant.  She states that she had a fairly good response to Zoloft in the past so we will transition to this. Visit Diagnosis:     ICD-10-CM   1. 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, in partial remission (HCC)  F32.4       Past Psychiatric History: none  Past Medical History:  Past Medical History:  Diagnosis Date   Anxiety    Depression    GERD (gastroesophageal reflux disease)     Past Surgical History:  Procedure Laterality Date   WISDOM TOOTH EXTRACTION      Family Psychiatric History: see below  Family History:  Family History  Problem Relation Age of Onset   Depression Mother    Anxiety disorder Mother    Drug abuse Father    Anxiety disorder Maternal Aunt    Alcohol abuse Maternal Grandfather    Anxiety disorder Maternal Grandfather     Social History:  Social History   Socioeconomic History   Marital status: Single    Spouse name: Not on file   Number of children: Not on file   Years of education: Not on file   Highest education level: Not on file  Occupational History   Not on file  Tobacco Use   Smoking status: Every Day    Packs/day: 1.00    Years: 4.00    Pack years: 4.00    Types: Cigarettes   Smokeless tobacco: Never  Vaping Use   Vaping Use: Former  Substance and Sexual Activity   Alcohol use: Yes    Comment: 3 beers 1-2 x per week   Drug use: No  Comment: 05-21-2016 per pt 2 yrs ago   Sexual activity: Yes    Birth control/protection: None  Other Topics Concern   Not on file  Social History Narrative   Not on file   Social Determinants of Health   Financial Resource Strain: Not on file  Food Insecurity: Not on file  Transportation Needs: Not on file  Physical Activity: Not on file  Stress: Not on file  Social Connections: Not on file    Allergies:  Allergies  Allergen Reactions   Ciprofloxacin Other (See Comments)    Tendonitis    Penicillins Hives    Has patient had a PCN reaction causing immediate rash, facial/tongue/throat swelling, SOB or lightheadedness with hypotension: No Has patient had a PCN  reaction causing severe rash involving mucus membranes or skin necrosis: No Has patient had a PCN reaction that required hospitalization No Has patient had a PCN reaction occurring within the last 10 years: No If all of the above answers are "NO", then may proceed with Cephalosporin use.    Nitrofurantoin Itching    Metabolic Disorder Labs: No results found for: HGBA1C, MPG No results found for: PROLACTIN No results found for: CHOL, TRIG, HDL, CHOLHDL, VLDL, LDLCALC Lab Results  Component Value Date   TSH 0.635 07/31/2016    Therapeutic Level Labs: No results found for: LITHIUM No results found for: VALPROATE No components found for:  CBMZ  Current Medications: Current Outpatient Medications  Medication Sig Dispense Refill   sertraline (ZOLOFT) 50 MG tablet Take 1 tablet (50 mg total) by mouth daily. 30 tablet 2   ALPRAZolam (XANAX) 0.5 MG tablet Take 1 tablet (0.5 mg total) by mouth 3 (three) times daily as needed for anxiety. 90 tablet 2   fluticasone (FLONASE) 50 MCG/ACT nasal spray Place 2 sprays into both nostrils daily. (Patient not taking: Reported on 03/09/2020) 16 g 6   hydrochlorothiazide (HYDRODIURIL) 12.5 MG tablet Take 1 tablet (12.5 mg total) by mouth daily. (Patient not taking: Reported on 03/09/2020) 30 tablet 1   levonorgestrel (LILETTA, 52 MG,) 19.5 MCG/DAY IUD IUD 1 each by Intrauterine route once.     metroNIDAZOLE (FLAGYL) 500 MG tablet Take 1 tablet (500 mg total) by mouth 2 (two) times daily. 14 tablet 0   omeprazole (PRILOSEC) 20 MG capsule Take 1 capsule (20 mg total) by mouth daily. 30 capsule 3   No current facility-administered medications for this visit.     Musculoskeletal: Strength & Muscle Tone: within normal limits Gait & Station: normal Patient leans: N/A  Psychiatric Specialty Exam: Review of Systems  Psychiatric/Behavioral:  Positive for dysphoric mood.   All other systems reviewed and are negative.  There were no vitals taken for this  visit.There is no height or weight on file to calculate BMI.  General Appearance: Casual, Neat, and Well Groomed  Eye Contact:  Good  Speech:  Clear and Coherent  Volume:  Normal  Mood:  Dysphoric  Affect:  Appropriate and Congruent  Thought Process:  Goal Directed  Orientation:  Full (Time, Place, and Person)  Thought Content: Rumination   Suicidal Thoughts:  No  Homicidal Thoughts:  No  Memory:  Immediate;   Good Recent;   Good Remote;   Good  Judgement:  Good  Insight:  Fair  Psychomotor Activity:  Normal  Concentration:  Concentration: Good and Attention Span: Good  Recall:  Good  Fund of Knowledge: Good  Language: Good  Akathisia:  No  Handed:  Right  AIMS (if indicated):  not done  Assets:  Communication Skills Desire for Improvement Physical Health Resilience Social Support Talents/Skills  ADL's:  Intact  Cognition: WNL  Sleep:  Good   Screenings: GAD-7    Flowsheet Row Office Visit from 04/19/2016 in [facility] Office Visit from 02/28/2016 in [facility]  Total GAD-7 Score 20 19      PHQ2-9    Flowsheet Row Video Visit from 07/05/2021 in [facility] Video Visit from 04/05/2021 in [facility] Video Visit from 12/12/2020 in [facility] Office Visit from 02/25/2020 in [facility] Office Visit from 10/12/2019 in [facility]  PHQ-2 Total Score 2 1 2  0 1  PHQ-9 Total Score 2 -- 9 0 1      Flowsheet Row Video Visit from 07/05/2021 in [facility] Video Visit from 04/05/2021 in [facility] Video Visit from 12/12/2020 in [facility]  C-SSRS RISK CATEGORY No Risk No Risk No Risk        Assessment  and Plan: This patient is a 25 year old female with a history of anxiety and depression.  She feels like she is still having numerous "down days" for no reason.  We will therefore switch to Zoloft 50 mg daily while she tapers off the Lexapro.  She will continue Xanax 0.5 mg 3 times daily as needed for anxiety.  She will return to see me in 4 weeks   34, MD 07/05/2021, 9:16 AM

## 2021-07-12 DIAGNOSIS — Z20822 Contact with and (suspected) exposure to covid-19: Secondary | ICD-10-CM | POA: Diagnosis not present

## 2021-07-12 DIAGNOSIS — U071 COVID-19: Secondary | ICD-10-CM | POA: Diagnosis not present

## 2021-07-12 DIAGNOSIS — R059 Cough, unspecified: Secondary | ICD-10-CM | POA: Diagnosis not present

## 2021-07-12 DIAGNOSIS — R070 Pain in throat: Secondary | ICD-10-CM | POA: Diagnosis not present

## 2021-08-02 DIAGNOSIS — F410 Panic disorder [episodic paroxysmal anxiety] without agoraphobia: Secondary | ICD-10-CM | POA: Diagnosis not present

## 2021-08-02 DIAGNOSIS — Z6834 Body mass index (BMI) 34.0-34.9, adult: Secondary | ICD-10-CM | POA: Diagnosis not present

## 2021-08-02 DIAGNOSIS — R42 Dizziness and giddiness: Secondary | ICD-10-CM | POA: Diagnosis not present

## 2021-08-02 DIAGNOSIS — R030 Elevated blood-pressure reading, without diagnosis of hypertension: Secondary | ICD-10-CM | POA: Diagnosis not present

## 2021-08-08 ENCOUNTER — Encounter (HOSPITAL_COMMUNITY): Payer: Self-pay | Admitting: Psychiatry

## 2021-08-08 ENCOUNTER — Telehealth (INDEPENDENT_AMBULATORY_CARE_PROVIDER_SITE_OTHER): Payer: BC Managed Care – PPO | Admitting: Psychiatry

## 2021-08-08 ENCOUNTER — Other Ambulatory Visit: Payer: Self-pay

## 2021-08-08 DIAGNOSIS — F324 Major depressive disorder, single episode, in partial remission: Secondary | ICD-10-CM | POA: Diagnosis not present

## 2021-08-08 DIAGNOSIS — F410 Panic disorder [episodic paroxysmal anxiety] without agoraphobia: Secondary | ICD-10-CM

## 2021-08-08 MED ORDER — ALPRAZOLAM 0.5 MG PO TABS: 0.5000 mg | ORAL_TABLET | Freq: Four times a day (QID) | ORAL | 2 refills | Status: DC | PRN

## 2021-08-08 MED ORDER — ESCITALOPRAM OXALATE 20 MG PO TABS: 30.0000 mg | ORAL_TABLET | Freq: Every day | ORAL | 2 refills | Status: DC

## 2021-08-08 NOTE — Progress Notes (Signed)
Virtual Visit via Video Note  I connected with Kayla Fraiselizabeth D Budge on 08/08/21 at 10:00 AM EST by a video enabled telemedicine application and verified that I am speaking with the correct person using two identifiers.  Location: Patient: home Provider: office   I discussed the limitations of evaluation and management by telemedicine and the availability of in person appointments. The patient expressed understanding and agreed to proceed.      I discussed the assessment and treatment plan with the patient. The patient was provided an opportunity to ask questions and all were answered. The patient agreed with the plan and demonstrated an understanding of the instructions.   The patient was advised to call back or seek an in-person evaluation if the symptoms worsen or if the condition fails to improve as anticipated.  I provided 20 minutes of non-face-to-face time during this encounter.   Diannia Rudereborah Jassica Zazueta, MD  [facility] MD/PA/NP OP Progress Note  08/08/2021 10:18 AM Kayla Sanchez  MRN:  [id number]  Chief Complaint:  Chief Complaint   Anxiety; Depression; Follow-up    HPI: This patient is a 26 year old single white female who lives with her 2 children in [place].  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  Since I saw her she was seen in the urgent care last week because she felt dizzy and anxious.  Her blood pressure was also elevated.  Does not have a history of hypertension.  She was given clonidine and meclizine at the urgent care but she is not taking either 1  The patient admits that she had drank alcohol the night before, about 6 beers.  She woke up feeling somewhat ill the next morning.  She admits that she drinks 6 beers at least 5 nights a week particularly when her children are visiting their fathers.  She denies being depressed but states sometimes she gets bored.  She is now very  worried that she is going to die or that her blood pressure is going to get out of control.  The more she worries more anxious she gets.  The anxiety seems to be making things worse.  She does not think the Zoloft has helped much and in fact makes her symptoms worse and she would like to go back to Lexapro.  She also is in agreement to restart counseling here.  The patient is working on cutting down her drinking.  She has already cut down caffeine and plans to quit smoking.  She denies any thoughts of self-harm or suicide.  She is sleeping and eating fairly well. Visit Diagnosis:    ICD-10-CM   1. 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, in partial remission (HCC)  F32.4       Past Psychiatric History: none  Past Medical History:  Past Medical History:  Diagnosis Date   Anxiety    Depression    GERD (gastroesophageal reflux disease)     Past Surgical History:  Procedure Laterality Date   WISDOM TOOTH EXTRACTION      Family Psychiatric History: see below  Family History:  Family History  Problem Relation Age of Onset   Depression Mother    Anxiety disorder Mother    Drug abuse Father    Anxiety disorder Maternal Aunt    Alcohol abuse Maternal Grandfather    Anxiety disorder Maternal Grandfather  Social History:  Social History   Socioeconomic History   Marital status: Single    Spouse name: Not on file   Number of children: Not on file   Years of education: Not on file   Highest education level: Not on file  Occupational History   Not on file  Tobacco Use   Smoking status: Every Day    Packs/day: 1.00    Years: 4.00    Pack years: 4.00    Types: Cigarettes   Smokeless tobacco: Never  Vaping Use   Vaping Use: Former  Substance and Sexual Activity   Alcohol use: Yes    Comment: 3 beers 1-2 x per week   Drug use: No    Comment: 05-21-2016 per pt 2 yrs ago   Sexual activity: Yes     Birth control/protection: None  Other Topics Concern   Not on file  Social History Narrative   Not on file   Social Determinants of Health   Financial Resource Strain: Not on file  Food Insecurity: Not on file  Transportation Needs: Not on file  Physical Activity: Not on file  Stress: Not on file  Social Connections: Not on file    Allergies:  Allergies  Allergen Reactions   Ciprofloxacin Other (See Comments)    Tendonitis    Penicillins Hives    Has patient had a PCN reaction causing immediate rash, facial/tongue/throat swelling, SOB or lightheadedness with hypotension: No Has patient had a PCN reaction causing severe rash involving mucus membranes or skin necrosis: No Has patient had a PCN reaction that required hospitalization No Has patient had a PCN reaction occurring within the last 10 years: No If all of the above answers are "NO", then may proceed with Cephalosporin use.    Nitrofurantoin Itching    Metabolic Disorder Labs: No results found for: HGBA1C, MPG No results found for: PROLACTIN No results found for: CHOL, TRIG, HDL, CHOLHDL, VLDL, LDLCALC Lab Results  Component Value Date   TSH 0.635 07/31/2016    Therapeutic Level Labs: No results found for: LITHIUM No results found for: VALPROATE No components found for:  CBMZ  Current Medications: Current Outpatient Medications  Medication Sig Dispense Refill   ALPRAZolam (XANAX) 0.5 MG tablet Take 1 tablet (0.5 mg total) by mouth 4 (four) times daily as needed for anxiety. 120 tablet 2   escitalopram (LEXAPRO) 20 MG tablet Take 1.5 tablets (30 mg total) by mouth daily. 45 tablet 2   fluticasone (FLONASE) 50 MCG/ACT nasal spray Place 2 sprays into both nostrils daily. (Patient not taking: Reported on 03/09/2020) 16 g 6   hydrochlorothiazide (HYDRODIURIL) 12.5 MG tablet Take 1 tablet (12.5 mg total) by mouth daily. (Patient not taking: Reported on 03/09/2020) 30 tablet 1   levonorgestrel (LILETTA, 52 MG,) 19.5  MCG/DAY IUD IUD 1 each by Intrauterine route once.     metroNIDAZOLE (FLAGYL) 500 MG tablet Take 1 tablet (500 mg total) by mouth 2 (two) times daily. 14 tablet 0   omeprazole (PRILOSEC) 20 MG capsule Take 1 capsule (20 mg total) by mouth daily. 30 capsule 3   No current facility-administered medications for this visit.     Musculoskeletal: Strength & Muscle Tone: within normal limits Gait & Station: normal Patient leans: N/A  Psychiatric Specialty Exam: Review of Systems  Psychiatric/Behavioral:  The patient is nervous/anxious.   All other systems reviewed and are negative.  There were no vitals taken for this visit.There is no height or weight on file to  calculate BMI.  General Appearance: Casual and Fairly Groomed  Eye Contact:  Good  Speech:  Clear and Coherent  Volume:  Normal  Mood:  Anxious  Affect:  Congruent  Thought Process:  Goal Directed  Orientation:  Full (Time, Place, and Person)  Thought Content: Rumination   Suicidal Thoughts:  No  Homicidal Thoughts:  No  Memory:  Immediate;   Good Recent;   Good Remote;   Good  Judgement:  Fair  Insight:  Fair  Psychomotor Activity:  Restlessness  Concentration:  Concentration: Good and Attention Span: Good  Recall:  Good  Fund of Knowledge: Good  Language: Good  Akathisia:  No  Handed:  Right  AIMS (if indicated): not done  Assets:  Communication Skills Desire for Improvement Resilience Social Support  ADL's:  Intact  Cognition: WNL  Sleep:  Fair   Screenings: GAD-7    Flowsheet Row Office Visit from 04/19/2016 in [facility] Office Visit from 02/28/2016 in [facility]  Total GAD-7 Score 20 19      PHQ2-9    Flowsheet Row [facility] Video Visit from 07/05/2021 in [facility] Video Visit from 04/05/2021 [facility] Video Visit from 12/12/2020 in [facility] Office Visit from 02/25/2020 in [facility]  PHQ-2 Total Score 0 2 1 2  0  PHQ-9 Total Score -- 2 -- 9 0      Flowsheet Row Video Visit from 08/08/2021 in [facility] Video Visit from 07/05/2021 in [facility] Video Visit from 04/05/2021 in [facility]  C-SSRS RISK CATEGORY No Risk No Risk No Risk        Assessment and Plan: This patient is a 26 year old female with a history of anxiety and depression.  She also admits to a fairly heavy amount of alcohol abuse although no drug abuse.  She is willing to try to cut this back and to start seeing a counselor here.  She does not feel like the Zoloft has been helpful so she will return to Celexa 30 mg daily for depression and anxiety.  She will continue Xanax 0.5 mg and increase the frequency up to 4 times daily since she is so anxious right now.  She realizes that she can never combine Xanax with alcohol.  She will return to see me in 4 weeks  34, MD 08/08/2021, 10:18 AM

## 2021-08-09 DIAGNOSIS — F419 Anxiety disorder, unspecified: Secondary | ICD-10-CM | POA: Diagnosis not present

## 2021-08-09 DIAGNOSIS — Z8659 Personal history of other mental and behavioral disorders: Secondary | ICD-10-CM | POA: Diagnosis not present

## 2021-08-09 DIAGNOSIS — I160 Hypertensive urgency: Secondary | ICD-10-CM | POA: Diagnosis not present

## 2021-08-10 DIAGNOSIS — R5383 Other fatigue: Secondary | ICD-10-CM | POA: Diagnosis not present

## 2021-08-10 DIAGNOSIS — Z13220 Encounter for screening for lipoid disorders: Secondary | ICD-10-CM | POA: Diagnosis not present

## 2021-08-10 DIAGNOSIS — F419 Anxiety disorder, unspecified: Secondary | ICD-10-CM | POA: Diagnosis not present

## 2021-08-10 DIAGNOSIS — Z1159 Encounter for screening for other viral diseases: Secondary | ICD-10-CM | POA: Diagnosis not present

## 2021-08-10 DIAGNOSIS — H538 Other visual disturbances: Secondary | ICD-10-CM | POA: Diagnosis not present

## 2021-08-10 DIAGNOSIS — Z136 Encounter for screening for cardiovascular disorders: Secondary | ICD-10-CM | POA: Diagnosis not present

## 2021-08-10 DIAGNOSIS — I10 Essential (primary) hypertension: Secondary | ICD-10-CM | POA: Diagnosis not present

## 2021-08-17 ENCOUNTER — Other Ambulatory Visit: Payer: Self-pay

## 2021-08-17 ENCOUNTER — Telehealth (INDEPENDENT_AMBULATORY_CARE_PROVIDER_SITE_OTHER): Payer: BC Managed Care – PPO | Admitting: Psychiatry

## 2021-08-17 ENCOUNTER — Encounter (HOSPITAL_COMMUNITY): Payer: Self-pay | Admitting: Psychiatry

## 2021-08-17 DIAGNOSIS — F324 Major depressive disorder, single episode, in partial remission: Secondary | ICD-10-CM | POA: Diagnosis not present

## 2021-08-17 DIAGNOSIS — F410 Panic disorder [episodic paroxysmal anxiety] without agoraphobia: Secondary | ICD-10-CM | POA: Diagnosis not present

## 2021-08-17 MED ORDER — SERTRALINE HCL 50 MG PO TABS: 50.0000 mg | ORAL_TABLET | Freq: Every day | ORAL | 2 refills | Status: DC

## 2021-08-17 NOTE — Progress Notes (Signed)
Virtual Visit via Video Note  I connected with Kayla Sanchez on 08/17/21 at  1:20 PM EST by a video enabled telemedicine application and verified that I am speaking with the correct person using two identifiers.  Location: Patient: home Provider: office   I discussed the limitations of evaluation and management by telemedicine and the availability of in person appointments. The patient expressed understanding and agreed to proceed.      I discussed the assessment and treatment plan with the patient. The patient was provided an opportunity to ask questions and all were answered. The patient agreed with the plan and demonstrated an understanding of the instructions.   The patient was advised to call back or seek an in-person evaluation if the symptoms worsen or if the condition fails to improve as anticipated.  I provided 15 minutes of non-face-to-face time during this encounter.   [name], MD  [facility] MD/PA/NP OP Progress Note  08/17/2021 1:36 PM Kayla Sanchez  MRN:  [id number]  Chief Complaint:  Chief Complaint   Anxiety; Depression; Follow-up    HPI: This patient is a 26 year old single white female who lives with her 2 children in [place].  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  She was seen about 10 days ago.  This was after she had become increasingly dizzy and anxious and was seen at the urgent care center.  Her blood pressure was somewhat high.  She states her anxiety has become worse recently and we switched her back to Lexapro.  She was also started on amlodipine by primary care for hypertension.  The patient states that the combination of these 2 medicines is making her feel strange.  She feels "depersonalized, with increasing amount of panic attacks and constant worry that something is wrong with her health.  She denies significant depression or suicidal  ideation.  In retrospect she thinks she did better on Zoloft and I suggest restart at a lower dose-50 mg and not take it in conjunction with amlodipine.  Xanax continues to use to help acutely for the panic attacks.  The patient had a complete medical work-up and all her labs were normal as well as a brain CT last week.  She has not drank any alcohol in the last 2 weeks. Visit Diagnosis:    ICD-10-CM   1. 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, in partial remission (HCC)  F32.4       Past Psychiatric History: none  Past Medical History:  Past Medical History:  Diagnosis Date   Anxiety    Depression    GERD (gastroesophageal reflux disease)     Past Surgical History:  Procedure Laterality Date   WISDOM TOOTH EXTRACTION      Family Psychiatric History: see below  Family History:  Family History  Problem Relation Age of Onset   Depression Mother    Anxiety disorder Mother    Drug abuse Father    Anxiety disorder Maternal Aunt    Alcohol abuse Maternal Grandfather    Anxiety disorder Maternal Grandfather     Social History:  Social History   Socioeconomic History   Marital status: Single    Spouse name: Not on file   Number of children: Not on file   Years of education: Not on file   Highest education level: Not on file  Occupational History   Not on file  Tobacco Use   Smoking status: Every Day    Packs/day: 1.00    Years: 4.00    Pack years: 4.00    Types: Cigarettes   Smokeless tobacco: Never  Vaping Use   Vaping Use: Former  Substance and Sexual Activity   Alcohol use: Yes    Comment: 3 beers 1-2 x per week   Drug use: No    Comment: 05-21-2016 per pt 2 yrs ago   Sexual activity: Yes    Birth control/protection: None  Other Topics Concern   Not on file  Social History Narrative   Not on file   Social Determinants of Health   Financial Resource Strain: Not on file  Food Insecurity: Not on file  Transportation  Needs: Not on file  Physical Activity: Not on file  Stress: Not on file  Social Connections: Not on file    Allergies:  Allergies  Allergen Reactions   Ciprofloxacin Other (See Comments)    Tendonitis    Penicillins Hives    Has patient had a PCN reaction causing immediate rash, facial/tongue/throat swelling, SOB or lightheadedness with hypotension: No Has patient had a PCN reaction causing severe rash involving mucus membranes or skin necrosis: No Has patient had a PCN reaction that required hospitalization No Has patient had a PCN reaction occurring within the last 10 years: No If all of the above answers are "NO", then may proceed with Cephalosporin use.    Nitrofurantoin Itching    Metabolic Disorder Labs: No results found for: HGBA1C, MPG No results found for: PROLACTIN No results found for: CHOL, TRIG, HDL, CHOLHDL, VLDL, LDLCALC Lab Results  Component Value Date   TSH 0.635 07/31/2016    Therapeutic Level Labs: No results found for: LITHIUM No results found for: VALPROATE No components found for:  CBMZ  Current Medications: Current Outpatient Medications  Medication Sig Dispense Refill   sertraline (ZOLOFT) 50 MG tablet Take 1 tablet (50 mg total) by mouth daily. 30 tablet 2   ALPRAZolam (XANAX) 0.5 MG tablet Take 1 tablet (0.5 mg total) by mouth 4 (four) times daily as needed for anxiety. 120 tablet 2   amLODipine (NORVASC) 5 MG tablet Take 5 mg by mouth daily.     fluticasone (FLONASE) 50 MCG/ACT nasal spray Place 2 sprays into both nostrils daily. (Patient not taking: Reported on 03/09/2020) 16 g 6   levonorgestrel (LILETTA, 52 MG,) 19.5 MCG/DAY IUD IUD 1 each by Intrauterine route once.     metroNIDAZOLE (FLAGYL) 500 MG tablet Take 1 tablet (500 mg total) by mouth 2 (two) times daily. 14 tablet 0   omeprazole (PRILOSEC) 20 MG capsule Take 1 capsule (20 mg total) by mouth daily. 30 capsule 3   No current facility-administered medications for this visit.      Musculoskeletal: Strength & Muscle Tone: within normal limits Gait & Station: normal Patient leans: N/A  Psychiatric Specialty Exam: Review of Systems  Cardiovascular:  Positive for palpitations.  Psychiatric/Behavioral:  The patient is nervous/anxious.   All other systems reviewed and are negative.  There were no vitals taken for this visit.There is no height or weight on file to calculate BMI.  General Appearance: Casual and Fairly Groomed  Eye Contact:  Good  Speech:  Clear and Coherent  Volume:  Normal  Mood:  Anxious  Affect:  Appropriate and Congruent  Thought Process:  Goal Directed  Orientation:  Full (Time, Place, and Person)  Thought Content:  Rumination   Suicidal Thoughts:  No  Homicidal Thoughts:  No  Memory:  Immediate;   Good Recent;   Good Remote;   Good  Judgement:  Good  Insight:  Fair  Psychomotor Activity:  Normal  Concentration:  Concentration: NA and Attention Span: Good  Recall:  Good  Fund of Knowledge: Good  Language: Good  Akathisia:  No  Handed:  Right  AIMS (if indicated): not done  Assets:  Communication Skills Desire for Improvement Physical Health Resilience Social Support Talents/Skills  ADL's:  Intact  Cognition: WNL  Sleep:  Good   Screenings: GAD-7    Flowsheet Row [facility]2016 [facility]  Total GAD-7 Score 20 19      PHQ2-9    Flowsheet Row Video Visit from 08/08/2021 [facility] Video Visit from 07/05/2021 in [facility] Video Visit from 04/05/2021 in [facility] Video Visit from 12/12/2020 in [facility] Office Visit from 02/25/2020 in [facility]  PHQ-2 Total Score 0 2 1 2  0  PHQ-9 Total Score -- 2 -- 9 0       Flowsheet Row Video Visit from 08/08/2021 in [facility] Video Visit from 07/05/2021 in [facility] Video Visit from 04/05/2021 [facility]  [facility]-SSRS RISK CATEGORY No Risk No Risk No Risk        Assessment and Plan: This patient is a 26 year old female with a history of anxiety and depression.  Since starting amlodipine in conjunction with Lexapro she has been feeling strange and having more panic attacks.  We will go back to Zoloft at 50 mg dosage and not take it in conjunction with amlodipine.  She can continue the Xanax 0.5 mg 4 times daily as needed for anxiety.  She will return to see me in 2 weeks   Diannia Rudereborah Bronson Bressman, MD 08/17/2021, 1:36 PM

## 2021-08-22 DIAGNOSIS — I10 Essential (primary) hypertension: Secondary | ICD-10-CM | POA: Diagnosis not present

## 2021-08-22 DIAGNOSIS — K219 Gastro-esophageal reflux disease without esophagitis: Secondary | ICD-10-CM | POA: Diagnosis not present

## 2021-08-22 DIAGNOSIS — R002 Palpitations: Secondary | ICD-10-CM | POA: Diagnosis not present

## 2021-08-22 DIAGNOSIS — F17209 Nicotine dependence, unspecified, with unspecified nicotine-induced disorders: Secondary | ICD-10-CM | POA: Diagnosis not present

## 2021-08-25 ENCOUNTER — Other Ambulatory Visit: Payer: Self-pay

## 2021-08-25 ENCOUNTER — Telehealth (INDEPENDENT_AMBULATORY_CARE_PROVIDER_SITE_OTHER): Payer: BC Managed Care – PPO | Admitting: Psychiatry

## 2021-08-25 ENCOUNTER — Encounter (HOSPITAL_COMMUNITY): Payer: Self-pay | Admitting: Psychiatry

## 2021-08-25 DIAGNOSIS — F324 Major depressive disorder, single episode, in partial remission: Secondary | ICD-10-CM | POA: Diagnosis not present

## 2021-08-25 DIAGNOSIS — F410 Panic disorder [episodic paroxysmal anxiety] without agoraphobia: Secondary | ICD-10-CM

## 2021-08-25 MED ORDER — ALPRAZOLAM 1 MG PO TABS: 1.0000 mg | ORAL_TABLET | Freq: Three times a day (TID) | ORAL | 2 refills | Status: DC

## 2021-08-25 NOTE — Progress Notes (Signed)
Virtual Visit via Video Note  I connected with Kayla Sanchez on 08/25/21 at 11:40 AM EST by a video enabled telemedicine application and verified that I am speaking with the correct person using two identifiers.  Location: Patient: home Provider: office   I discussed the limitations of evaluation and management by telemedicine and the availability of in person appointments. The patient expressed understanding and agreed to proceed.     I discussed the assessment and treatment plan with the patient. The patient was provided an opportunity to ask questions and all were answered. The patient agreed with the plan and demonstrated an understanding of the instructions.   The patient was advised to call back or seek an in-person evaluation if the symptoms worsen or if the condition fails to improve as anticipated.  I provided 20 minutes of non-face-to-face time during this encounter.   Diannia Rudereborah Naava Janeway, MD  [facility] MD/PA/NP OP Progress Note  08/25/2021 12:13 PM Kayla Sanchez  MRN:  [id number]  Chief Complaint: depression, anxiety HPI:  This patient is a 26 year old single white female who lives [place] her 2 children in [place].  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  She states that her anxiety has gotten extremely debilitating.  She is constantly having panic attacks.  She is starting to wake during sleep.  Zoloft is helping her depression "a little bit" she is laying in bed all the time and does not feel able to function.  She is going to asked for short-term disability at work.  I think given her level of decline that she would do best to get involved in an intensive outpatient program or partial hospital program.  I will make a referral today.  She is agreeable to this.  She denies any thoughts of self-harm or suicidal ideation or psychotic symptoms.  The patient states that the Xanax 0.5 mg is not  doing much for her but the 1 mg does help so we will increase the dosage at least temporarily.  She was recently prescribed propranolol but has not yet picked it up. Visit Diagnosis:    ICD-10-CM   1. 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, in partial remission (HCC)  F32.4       Past Psychiatric History: none  Past Medical History:  Past Medical History:  Diagnosis Date   Anxiety    Depression    GERD (gastroesophageal reflux disease)     Past Surgical History:  Procedure Laterality Date   WISDOM TOOTH EXTRACTION      Family Psychiatric History: see below  Family History:  Family History  Problem Relation Age of Onset   Depression Mother    Anxiety disorder Mother    Drug abuse Father    Anxiety disorder Maternal Aunt    Alcohol abuse Maternal Grandfather    Anxiety disorder Maternal Grandfather     Social History:  Social History   Socioeconomic History   Marital status: Single    Spouse name: Not on file   Number of children: Not on file   Years of education: Not on file   Highest education level: Not on file  Occupational History   Not on file  Tobacco Use   Smoking status: Every Day    Packs/day: 1.00    Years: 4.00    Pack years: 4.00    Types:  Cigarettes   Smokeless tobacco: Never  Vaping Use   Vaping Use: Former  Substance and Sexual Activity   Alcohol use: Yes    Comment: 3 beers 1-2 x per week   Drug use: No    Comment: 05-21-2016 per pt 2 yrs ago   Sexual activity: Yes    Birth control/protection: None  Other Topics Concern   Not on file  Social History Narrative   Not on file   Social Determinants of Health   Financial Resource Strain: Not on file  Food Insecurity: Not on file  Transportation Needs: Not on file  Physical Activity: Not on file  Stress: Not on file  Social Connections: Not on file    Allergies:  Allergies  Allergen Reactions   Ciprofloxacin Other (See Comments)     Tendonitis    Penicillins Hives    Has patient had a PCN reaction causing immediate rash, facial/tongue/throat swelling, SOB or lightheadedness with hypotension: No Has patient had a PCN reaction causing severe rash involving mucus membranes or skin necrosis: No Has patient had a PCN reaction that required hospitalization No Has patient had a PCN reaction occurring within the last 10 years: No If all of the above answers are "NO", then may proceed with Cephalosporin use.    Nitrofurantoin Itching    Metabolic Disorder Labs: No results found for: HGBA1C, MPG No results found for: PROLACTIN No results found for: CHOL, TRIG, HDL, CHOLHDL, VLDL, LDLCALC Lab Results  Component Value Date   TSH 0.635 07/31/2016    Therapeutic Level Labs: No results found for: LITHIUM No results found for: VALPROATE No components found for:  CBMZ  Current Medications: Current Outpatient Medications  Medication Sig Dispense Refill   ALPRAZolam (XANAX) 1 MG tablet Take 1 tablet (1 mg total) by mouth 3 (three) times daily. 90 tablet 2   propranolol (INDERAL) 10 MG tablet Take by mouth.     amLODipine (NORVASC) 5 MG tablet Take 5 mg by mouth daily.     fluticasone (FLONASE) 50 MCG/ACT nasal spray Place 2 sprays into both nostrils daily. (Patient not taking: Reported on 03/09/2020) 16 g 6   levonorgestrel (LILETTA, 52 MG,) 19.5 MCG/DAY IUD IUD 1 each by Intrauterine route once.     metroNIDAZOLE (FLAGYL) 500 MG tablet Take 1 tablet (500 mg total) by mouth 2 (two) times daily. 14 tablet 0   omeprazole (PRILOSEC) 20 MG capsule Take 1 capsule (20 mg total) by mouth daily. 30 capsule 3   sertraline (ZOLOFT) 50 MG tablet Take 1 tablet (50 mg total) by mouth daily. 30 tablet 2   No current facility-administered medications for this visit.     Musculoskeletal: Strength & Muscle Tone: within normal limits Gait & Station: normal Patient leans: N/A  Psychiatric Specialty Exam: Review of Systems   Constitutional:  Positive for fatigue.  Psychiatric/Behavioral:  Positive for dysphoric mood and sleep disturbance. The patient is nervous/anxious.   All other systems reviewed and are negative.  There were no vitals taken for this visit.There is no height or weight on file to calculate BMI.  General Appearance: Casual and Fairly Groomed  Eye Contact:  Good  Speech:  Clear and Coherent  Volume:  Normal  Mood:  Anxious and Depressed  Affect:  Depressed  Thought Process:  Goal Directed  Orientation:  Full (Time, Place, and Person)  Thought Content: Rumination   Suicidal Thoughts:  No  Homicidal Thoughts:  No  Memory:  Immediate;   Good Recent;  Good Remote;   Good  Judgement:  Good  Insight:  Good  Psychomotor Activity:  Decreased  Concentration:  Concentration: Fair and Attention Span: Fair  Recall:  Good  Fund of Knowledge: Good  Language: Good  Akathisia:  No  Handed:  Right  AIMS (if indicated): not done  Assets:  Communication Skills Desire for Improvement Physical Health Resilience Social Support  ADL's:  Intact  Cognition: WNL  Sleep:  Fair   Screenings: GAD-7    Flowsheet Row Office Visit from 04/19/2016 in [facility] Office Visit from 02/28/2016 in [facility]  Total GAD-7 Score 20 19      PHQ2-9    Flowsheet Row [facility] Video Visit from 08/08/2021 in [facility] Video Visit from 07[facility]05/2021 in [facility] Video Visit from 04/05/2021 in [facility] Video Visit from 12/12/2020 [facility]  PHQ-2 Total Score 6 0 2 1 2   PHQ-9 Total Score 13 -- 2 -- 9      Flowsheet Row Video Visit from 08/25/2021 [facility] Video Visit from  08/08/2021 in [facility] Video Visit from 07/05/2021 in [facility]  C-SSRS RISK CATEGORY No Risk No Risk No Risk        Assessment and Plan: This patient is a 26 year old female with a history of anxiety and depression.  Her anxiety is getting worse and it seems to build on her self.  The more she feels anxious and more panicky she gets.  At this point her level of function is severely impaired so we will need to get her into a partial hospital or intensive outpatient program.  She does feel like her depression has gotten a little bit better with Zoloft 50 mg daily so this will be continued.  She will increase Xanax to 1 mg 3 times daily and start the propranolol from PCP.  She will return to see me in 2 weeks or call sooner as needed   34, MD 08/25/2021, 12:13 PM

## 2021-08-28 ENCOUNTER — Telehealth (HOSPITAL_COMMUNITY): Payer: Self-pay | Admitting: Professional

## 2021-08-28 ENCOUNTER — Ambulatory Visit (HOSPITAL_COMMUNITY): Payer: BC Managed Care – PPO | Admitting: Psychiatry

## 2021-08-31 ENCOUNTER — Telehealth (HOSPITAL_COMMUNITY): Payer: BC Managed Care – PPO | Admitting: Psychiatry

## 2021-08-31 ENCOUNTER — Telehealth (HOSPITAL_COMMUNITY): Payer: Self-pay | Admitting: Professional

## 2021-09-04 ENCOUNTER — Other Ambulatory Visit: Payer: Self-pay

## 2021-09-04 ENCOUNTER — Other Ambulatory Visit (HOSPITAL_COMMUNITY): Payer: BC Managed Care – PPO | Attending: Psychiatry | Admitting: Licensed Clinical Social Worker

## 2021-09-04 DIAGNOSIS — F332 Major depressive disorder, recurrent severe without psychotic features: Secondary | ICD-10-CM | POA: Insufficient documentation

## 2021-09-04 DIAGNOSIS — F411 Generalized anxiety disorder: Secondary | ICD-10-CM

## 2021-09-04 DIAGNOSIS — F410 Panic disorder [episodic paroxysmal anxiety] without agoraphobia: Secondary | ICD-10-CM | POA: Insufficient documentation
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
## 2021-09-05 ENCOUNTER — Other Ambulatory Visit: Payer: Self-pay

## 2021-09-05 ENCOUNTER — Encounter (HOSPITAL_COMMUNITY): Payer: Self-pay | Admitting: Family

## 2021-09-05 ENCOUNTER — Other Ambulatory Visit (HOSPITAL_COMMUNITY): Payer: BC Managed Care – PPO | Admitting: Licensed Clinical Social Worker

## 2021-09-05 ENCOUNTER — Other Ambulatory Visit (HOSPITAL_COMMUNITY): Payer: BC Managed Care – PPO

## 2021-09-05 DIAGNOSIS — F332 Major depressive disorder, recurrent severe without psychotic features: Secondary | ICD-10-CM

## 2021-09-05 DIAGNOSIS — F324 Major depressive disorder, single episode, in partial remission: Secondary | ICD-10-CM

## 2021-09-05 DIAGNOSIS — F410 Panic disorder [episodic paroxysmal anxiety] without agoraphobia: Secondary | ICD-10-CM

## 2021-09-05 NOTE — Plan of Care (Signed)
Pt agrees to tx plan °
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
## 2021-09-06 ENCOUNTER — Other Ambulatory Visit (HOSPITAL_COMMUNITY): Payer: BC Managed Care – PPO

## 2021-09-06 ENCOUNTER — Ambulatory Visit (HOSPITAL_COMMUNITY): Payer: BC Managed Care – PPO

## 2021-09-06 ENCOUNTER — Telehealth (HOSPITAL_COMMUNITY): Payer: Self-pay | Admitting: Licensed Clinical Social Worker

## 2021-09-07 ENCOUNTER — Telehealth (INDEPENDENT_AMBULATORY_CARE_PROVIDER_SITE_OTHER): Payer: BC Managed Care – PPO | Admitting: Psychiatry

## 2021-09-07 ENCOUNTER — Other Ambulatory Visit: Payer: Self-pay

## 2021-09-07 ENCOUNTER — Encounter (HOSPITAL_COMMUNITY): Payer: Self-pay | Admitting: Psychiatry

## 2021-09-07 ENCOUNTER — Ambulatory Visit (HOSPITAL_COMMUNITY): Payer: BC Managed Care – PPO

## 2021-09-07 DIAGNOSIS — F411 Generalized anxiety disorder: Secondary | ICD-10-CM

## 2021-09-07 DIAGNOSIS — F410 Panic disorder [episodic paroxysmal anxiety] without agoraphobia: Secondary | ICD-10-CM

## 2021-09-07 DIAGNOSIS — F324 Major depressive disorder, single episode, in partial remission: Secondary | ICD-10-CM | POA: Diagnosis not present

## 2021-09-07 MED ORDER — SERTRALINE HCL 50 MG PO TABS: 50.0000 mg | ORAL_TABLET | Freq: Every day | ORAL | 2 refills | Status: DC

## 2021-09-07 NOTE — Progress Notes (Signed)
Virtual Visit via Video Note  I connected with Kayla Sanchez on 09/07/21 at  1:40 PM EST by a video enabled telemedicine application and verified that I am speaking with the correct person using two identifiers.  Location: Patient: home Provider: office   I discussed the limitations of evaluation and management by telemedicine and the availability of in person appointments. The patient expressed understanding and agreed to proceed.     I discussed the assessment and treatment plan with the patient. The patient was provided an opportunity to ask questions and all were answered. The patient agreed with the plan and demonstrated an understanding of the instructions.   The patient was advised to call back or seek an in-person evaluation if the symptoms worsen or if the condition fails to improve as anticipated.  I provided 15 minutes of non-face-to-face time during this encounter.   [name], MD  [facility] MD/PA/NP OP Progress Note  09/07/2021 1:56 PM Kayla Sanchez  MRN:  [id number]  Chief Complaint:  Chief Complaint   Depression; Anxiety; Follow-up    HPI: This patient is a 26 year old single white female who lives with her 2 children in [place].  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  Last time her anxiety got very debilitating.  I had referred her to the partial hospital program.  She did the intake but then realized she could not afford it as her insurance was not going to pay as much as she had hoped.  Nevertheless the increase in Xanax has really helped her anxiety.  She is much more calm during the day.  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  She is sleeping better.  She denies serious depression or suicidal ideation.  She is not having as many panic attacks.  She is concentrating and eating better. Visit Diagnosis:    ICD-10-CM   1. Major  depressive disorder with single episode, in partial remission (HCC)  F32.4     2. Panic disorder  F41.0     3. GAD (generalized anxiety disorder)  F41.1       Past Psychiatric History: none  Past Medical History:  Past Medical History:  Diagnosis Date   Anxiety    Depression    GERD (gastroesophageal reflux disease)     Past Surgical History:  Procedure Laterality Date   WISDOM TOOTH EXTRACTION      Family Psychiatric History: see below  Family History:  Family History  Problem Relation Age of Onset   Depression Mother    Anxiety disorder Mother    Drug abuse Father    Anxiety disorder Maternal Aunt    Alcohol abuse Maternal Grandfather    Anxiety disorder Maternal Grandfather     Social History:  Social History   Socioeconomic History   Marital status: Single    Spouse name: Not on file   Number of children: Not on file   Years of education: Not on file   Highest education level: Not on file  Occupational History   Not on file  Tobacco Use   Smoking status: Every Day    Packs/day: 1.00    Years: 4.00    Pack years: 4.00    Types: Cigarettes   Smokeless tobacco: Never  Vaping Use   Vaping Use: Former  Substance and Sexual Activity   Alcohol use: Yes  Comment: 3 beers 1-2 x per week   Drug use: No    Comment: 05-21-2016 per pt 2 yrs ago   Sexual activity: Yes    Birth control/protection: None  Other Topics Concern   Not on file  Social History Narrative   Not on file   Social Determinants of Health   Financial Resource Strain: Not on file  Food Insecurity: Not on file  Transportation Needs: Not on file  Physical Activity: Not on file  Stress: Not on file  Social Connections: Not on file    Allergies:  Allergies  Allergen Reactions   Ciprofloxacin Other (See Comments)    Tendonitis    Penicillins Hives    Has patient had a PCN reaction causing immediate rash, facial/tongue/throat swelling, SOB or lightheadedness with hypotension:  No Has patient had a PCN reaction causing severe rash involving mucus membranes or skin necrosis: No Has patient had a PCN reaction that required hospitalization No Has patient had a PCN reaction occurring within the last 10 years: No If all of the above answers are "NO", then may proceed with Cephalosporin use.    Nitrofurantoin Itching    Metabolic Disorder Labs: No results found for: HGBA1C, MPG No results found for: PROLACTIN No results found for: CHOL, TRIG, HDL, CHOLHDL, VLDL, LDLCALC Lab Results  Component Value Date   TSH 0.635 07/31/2016    Therapeutic Level Labs: No results found for: LITHIUM No results found for: VALPROATE No components found for:  CBMZ  Current Medications: Current Outpatient Medications  Medication Sig Dispense Refill   ALPRAZolam (XANAX) 1 MG tablet Take 1 tablet (1 mg total) by mouth 3 (three) times daily. 90 tablet 2   amLODipine (NORVASC) 5 MG tablet Take 5 mg by mouth daily.     fluticasone (FLONASE) 50 MCG/ACT nasal spray Place 2 sprays into both nostrils daily. (Patient not taking: Reported on 03/09/2020) 16 g 6   levonorgestrel (LILETTA, 52 MG,) 19.5 MCG/DAY IUD IUD 1 each by Intrauterine route once.     metroNIDAZOLE (FLAGYL) 500 MG tablet Take 1 tablet (500 mg total) by mouth 2 (two) times daily. 14 tablet 0   omeprazole (PRILOSEC) 20 MG capsule Take 1 capsule (20 mg total) by mouth daily. 30 capsule 3   sertraline (ZOLOFT) 50 MG tablet Take 1 tablet (50 mg total) by mouth daily. 30 tablet 2   No current facility-administered medications for this visit.     Musculoskeletal: Strength & Muscle Tone: within normal limits Gait & Station: normal Patient leans: N/A  Psychiatric Specialty Exam: Review of Systems  Psychiatric/Behavioral:  The patient is nervous/anxious.   All other systems reviewed and are negative.  There were no vitals taken for this visit.There is no height or weight on file to calculate BMI.  General Appearance:  Casual, Neat, and Well Groomed  Eye Contact:  Good  Speech:  Clear and Coherent  Volume:  Normal  Mood:  Anxious and Euthymic  Affect:  Appropriate and Congruent  Thought Process:  Goal Directed  Orientation:  Full (Time, Place, and Person)  Thought Content: WDL   Suicidal Thoughts:  No  Homicidal Thoughts:  No  Memory:  Immediate;   Good Recent;   Good Remote;   Good  Judgement:  Good  Insight:  Fair  Psychomotor Activity:  Normal  Concentration:  Concentration: Good and Attention Span: Good  Recall:  Good  Fund of Knowledge: Good  Language: Good  Akathisia:  No  Handed:  Right  AIMS (if indicated): not done  Assets:  Communication Skills Desire for Improvement Physical Health Resilience Social Support Talents/Skills  ADL's:  Intact  Cognition: WNL  Sleep:  Good   Screenings: GAD-7    Flowsheet Row Office Visit from 04/19/2016 in [facility] Office Visit from 02/28/2016 in [facility]  Total GAD-7 Score 20 19      PHQ2-9    Flowsheet Row [facility] [facility] from 09/04/2021 [facility] Video Visit from 08/25/2021 in [facility] Video Visit from 08/08/2021 in [facility] Video Visit from 07/05/2021 in [facility]  PHQ-2 Total Score 1 6 6  0 2  PHQ-9 Total Score 2 15 13  -- 2      Flowsheet Row Video Visit from 09/07/2021 [facility] Counselor from 09/04/2021 in [facility] Video Visit from 08/25/2021 in [facility]  C-SSRS RISK CATEGORY No Risk No Risk No Risk        Assessment and Plan: This patient is a 26 year old female with a history of anxiety and depression.   The increased level of Xanax has really helped.  She will continue Xanax 1 mg 3 times daily as well as Zoloft 50 mg daily for depression and anxiety.  She will restart her therapy here she will return to see me in 4 weeks   [name], MD 09/07/2021, 1:56 PM

## 2021-09-08 ENCOUNTER — Other Ambulatory Visit (HOSPITAL_COMMUNITY): Payer: BC Managed Care – PPO

## 2021-09-08 ENCOUNTER — Telehealth (HOSPITAL_COMMUNITY): Payer: BC Managed Care – PPO | Admitting: Psychiatry

## 2021-09-08 ENCOUNTER — Ambulatory Visit (HOSPITAL_COMMUNITY): Payer: BC Managed Care – PPO

## 2021-09-08 DIAGNOSIS — M549 Dorsalgia, unspecified: Secondary | ICD-10-CM | POA: Diagnosis not present

## 2021-09-11 ENCOUNTER — Other Ambulatory Visit (HOSPITAL_COMMUNITY): Payer: BC Managed Care – PPO

## 2021-09-11 ENCOUNTER — Ambulatory Visit (HOSPITAL_COMMUNITY): Payer: BC Managed Care – PPO

## 2021-09-12 ENCOUNTER — Other Ambulatory Visit (HOSPITAL_COMMUNITY): Payer: BC Managed Care – PPO

## 2021-09-12 ENCOUNTER — Ambulatory Visit (HOSPITAL_COMMUNITY): Payer: BC Managed Care – PPO

## 2021-09-13 ENCOUNTER — Other Ambulatory Visit (HOSPITAL_COMMUNITY): Payer: BC Managed Care – PPO

## 2021-09-13 ENCOUNTER — Ambulatory Visit (HOSPITAL_COMMUNITY): Payer: BC Managed Care – PPO

## 2021-09-14 ENCOUNTER — Ambulatory Visit (HOSPITAL_COMMUNITY): Payer: BC Managed Care – PPO

## 2021-09-14 ENCOUNTER — Other Ambulatory Visit (HOSPITAL_COMMUNITY): Payer: BC Managed Care – PPO

## 2021-09-15 ENCOUNTER — Other Ambulatory Visit (HOSPITAL_COMMUNITY): Payer: BC Managed Care – PPO

## 2021-09-15 ENCOUNTER — Ambulatory Visit (HOSPITAL_COMMUNITY): Payer: BC Managed Care – PPO

## 2021-09-15 DIAGNOSIS — R42 Dizziness and giddiness: Secondary | ICD-10-CM | POA: Diagnosis not present

## 2021-09-18 ENCOUNTER — Other Ambulatory Visit (HOSPITAL_COMMUNITY): Payer: BC Managed Care – PPO

## 2021-09-18 ENCOUNTER — Ambulatory Visit (HOSPITAL_COMMUNITY): Payer: BC Managed Care – PPO

## 2021-09-19 ENCOUNTER — Other Ambulatory Visit (HOSPITAL_COMMUNITY): Payer: BC Managed Care – PPO

## 2021-09-19 ENCOUNTER — Ambulatory Visit (HOSPITAL_COMMUNITY): Payer: BC Managed Care – PPO

## 2021-09-20 ENCOUNTER — Other Ambulatory Visit (HOSPITAL_COMMUNITY): Payer: BC Managed Care – PPO

## 2021-09-20 ENCOUNTER — Ambulatory Visit (HOSPITAL_COMMUNITY): Payer: BC Managed Care – PPO

## 2021-09-20 DIAGNOSIS — Z6833 Body mass index (BMI) 33.0-33.9, adult: Secondary | ICD-10-CM | POA: Diagnosis not present

## 2021-09-20 DIAGNOSIS — I10 Essential (primary) hypertension: Secondary | ICD-10-CM | POA: Diagnosis not present

## 2021-09-20 DIAGNOSIS — R002 Palpitations: Secondary | ICD-10-CM | POA: Diagnosis not present

## 2021-09-20 DIAGNOSIS — L709 Acne, unspecified: Secondary | ICD-10-CM | POA: Diagnosis not present

## 2021-09-21 ENCOUNTER — Other Ambulatory Visit (HOSPITAL_COMMUNITY): Payer: BC Managed Care – PPO

## 2021-09-21 ENCOUNTER — Ambulatory Visit (HOSPITAL_COMMUNITY): Payer: BC Managed Care – PPO

## 2021-09-22 ENCOUNTER — Ambulatory Visit (HOSPITAL_COMMUNITY): Payer: BC Managed Care – PPO

## 2021-09-22 ENCOUNTER — Other Ambulatory Visit (HOSPITAL_COMMUNITY): Payer: BC Managed Care – PPO

## 2021-09-25 ENCOUNTER — Other Ambulatory Visit: Payer: Self-pay

## 2021-09-25 ENCOUNTER — Ambulatory Visit (INDEPENDENT_AMBULATORY_CARE_PROVIDER_SITE_OTHER): Payer: BC Managed Care – PPO | Admitting: Psychiatry

## 2021-09-25 ENCOUNTER — Encounter (HOSPITAL_COMMUNITY): Payer: Self-pay | Admitting: Psychiatry

## 2021-09-25 DIAGNOSIS — F324 Major depressive disorder, single episode, in partial remission: Secondary | ICD-10-CM | POA: Diagnosis not present
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
## 2021-10-05 ENCOUNTER — Telehealth (HOSPITAL_COMMUNITY): Payer: Self-pay | Admitting: *Deleted

## 2021-10-05 ENCOUNTER — Other Ambulatory Visit: Payer: Self-pay

## 2021-10-05 ENCOUNTER — Encounter (HOSPITAL_COMMUNITY): Payer: Self-pay | Admitting: Psychiatry

## 2021-10-05 ENCOUNTER — Telehealth (INDEPENDENT_AMBULATORY_CARE_PROVIDER_SITE_OTHER): Payer: BC Managed Care – PPO | Admitting: Psychiatry

## 2021-10-05 DIAGNOSIS — F324 Major depressive disorder, single episode, in partial remission: Secondary | ICD-10-CM | POA: Diagnosis not present

## 2021-10-05 DIAGNOSIS — F410 Panic disorder [episodic paroxysmal anxiety] without agoraphobia: Secondary | ICD-10-CM | POA: Diagnosis not present

## 2021-10-05 DIAGNOSIS — F411 Generalized anxiety disorder: Secondary | ICD-10-CM

## 2021-10-05 MED ORDER — SERTRALINE HCL 50 MG PO TABS: 50.0000 mg | ORAL_TABLET | Freq: Every day | ORAL | 2 refills | Status: DC

## 2021-10-05 MED ORDER — ALPRAZOLAM 1 MG PO TABS: 1.0000 mg | ORAL_TABLET | Freq: Three times a day (TID) | ORAL | 2 refills | Status: DC

## 2021-10-05 NOTE — Telephone Encounter (Signed)
Patient called stating she is doing a pre screening for her job and one of her medication will show up in her drug screening. Employer would like provider to write a letter stating which medication may show up in the pre job drug screen.  ? ?[phone] ?

## 2021-10-05 NOTE — Telephone Encounter (Signed)
completed

## 2021-10-05 NOTE — Progress Notes (Signed)
Virtual Visit via Video Note  I connected with Kayla Sanchez Siverling on 10/05/21 at 10:00 AM EST by a video enabled telemedicine application and verified that I am speaking with the correct person using two identifiers.  Location: Patient: home Provider: office   I discussed the limitations of evaluation and management by telemedicine and the availability of in person appointments. The patient expressed understanding and agreed to proceed.     I discussed the assessment and treatment plan with the patient. The patient was provided an opportunity to ask questions and all were answered. The patient agreed with the plan and demonstrated an understanding of the instructions.   The patient was advised to call back or seek an in-person evaluation if the symptoms worsen or if the condition fails to improve as anticipated.  I provided 15 minutes of non-face-to-face time during this encounter.   Diannia Rudereborah Jalei Shibley, MD  [facility] MD/PA/NP OP Progress Note  10/05/2021 10:21 AM Kayla Sanchez [place]  MRN:  [id number]  Chief Complaint:  Chief Complaint  Patient presents with   Depression   Anxiety   Follow-up   HPI: This patient is a 26 year old single white female who lives with her 2 children in [place].  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  She states that she is feeling better.  She still is a little depressed at times but nowhere near as much as before.  She still has panic attacks occasionally but the Xanax has really helped.  She is generally calmer through the day.  She is hoping to transition to a job where she can work from home which woke have a lot less stress with it.  She denies serious depression or suicidal ideation or fears of death.  She is concentrating and eating better and her blood pressure is normalized. Visit Diagnosis:    ICD-10-CM   1. Major depressive disorder with single episode, in  partial remission (HCC)  F32.4     2. Panic disorder  F41.0     3. GAD (generalized anxiety disorder)  F41.1       Past Psychiatric History: none Past Medical History:  Past Medical History:  Diagnosis Date   Anxiety    Depression    GERD (gastroesophageal reflux disease)     Past Surgical History:  Procedure Laterality Date   WISDOM TOOTH EXTRACTION      Family Psychiatric History: see below  Family History:  Family History  Problem Relation Age of Onset   Depression Mother    Anxiety disorder Mother    Drug abuse Father    Anxiety disorder Maternal Aunt    Alcohol abuse Maternal Grandfather    Anxiety disorder Maternal Grandfather     Social History:  Social History   Socioeconomic History   Marital status: Single    Spouse name: Not on file   Number of children: Not on file   Years of education: Not on file   Highest education level: Not on file  Occupational History   Not on file  Tobacco Use   Smoking status: Every Day    Packs/day: 1.00    Years: 4.00    Pack years: 4.00    Types: Cigarettes   Smokeless tobacco: Never  Vaping Use   Vaping Use: Former  Substance and Sexual Activity   Alcohol use: Yes    Comment: 3 beers 1-2 x per week  Drug use: No    Comment: 05-21-2016 per pt 2 yrs ago   Sexual activity: Not Currently    Birth control/protection: None  Other Topics Concern   Not on file  Social History Narrative   Not on file   Social Determinants of Health   Financial Resource Strain: Not on file  Food Insecurity: Not on file  Transportation Needs: Not on file  Physical Activity: Not on file  Stress: Not on file  Social Connections: Not on file    Allergies:  Allergies  Allergen Reactions   Ciprofloxacin Other (See Comments)    Tendonitis    Penicillins Hives    Has patient had a PCN reaction causing immediate rash, facial/tongue/throat swelling, SOB or lightheadedness with hypotension: No Has patient had a PCN reaction  causing severe rash involving mucus membranes or skin necrosis: No Has patient had a PCN reaction that required hospitalization No Has patient had a PCN reaction occurring within the last 10 years: No If all of the above answers are "NO", then may proceed with Cephalosporin use.    Nitrofurantoin Itching    Metabolic Disorder Labs: No results found for: HGBA1C, MPG No results found for: PROLACTIN No results found for: CHOL, TRIG, HDL, CHOLHDL, VLDL, LDLCALC Lab Results  Component Value Date   TSH 0.635 07/31/2016    Therapeutic Level Labs: No results found for: LITHIUM No results found for: VALPROATE No components found for:  CBMZ  Current Medications: Current Outpatient Medications  Medication Sig Dispense Refill   ALPRAZolam (XANAX) 1 MG tablet Take 1 tablet (1 mg total) by mouth 3 (three) times daily. 90 tablet 2   amLODipine (NORVASC) 5 MG tablet Take 5 mg by mouth daily.     fluticasone (FLONASE) 50 MCG/ACT nasal spray Place 2 sprays into both nostrils daily. (Patient not taking: Reported on 03/09/2020) 16 g 6   levonorgestrel (LILETTA, 52 MG,) 19.5 MCG/DAY IUD IUD 1 each by Intrauterine route once.     omeprazole (PRILOSEC) 20 MG capsule Take 1 capsule (20 mg total) by mouth daily. 30 capsule 3   sertraline (ZOLOFT) 50 MG tablet Take 1 tablet (50 mg total) by mouth daily. 30 tablet 2   No current facility-administered medications for this visit.     Musculoskeletal: Strength & Muscle Tone: within normal limits Gait & Station: normal Patient leans: N/A  Psychiatric Specialty Exam: Review of Systems  Neurological:  Positive for headaches.  Psychiatric/Behavioral:  The patient is nervous/anxious.   All other systems reviewed and are negative.  There were no vitals taken for this visit.There is no height or weight on file to calculate BMI.  General Appearance: Casual, Neat, and Well Groomed  Eye Contact:  Good  Speech:  Clear and Coherent  Volume:  Normal  Mood:   Anxious and Euthymic  Affect:  Appropriate and Congruent  Thought Process:  Goal Directed  Orientation:  Full (Time, Place, and Person)  Thought Content: Rumination   Suicidal Thoughts:  No  Homicidal Thoughts:  No  Memory:  Immediate;   Good Recent;   Good Remote;   Good  Judgement:  Good  Insight:  Fair  Psychomotor Activity:  Normal  Concentration:  Concentration: Good and Attention Span: Good  Recall:  Good  Fund of Knowledge: Good  Language: Good  Akathisia:  No  Handed:  Right  AIMS (if indicated): not done  Assets:  Communication Skills Desire for Improvement Physical Health Resilience Social Support Talents/Skills  ADL's:  Intact  Cognition: WNL  Sleep:  Good   Screenings: GAD-7    Flowsheet Row Office Visit from 04/19/2016 in [facility] Office Visit from 02/28/2016 in [facility]  Total GAD-7 Score 20 19      PHQ2-9    Flowsheet Row [facility] [facility] from 09/25/2021 [facility] Video Visit from 09/07/2021 [facility] Counselor from 09/04/2021 [facility] Video Visit from 08/25/2021 in [facility]  PHQ-2 Total Score 1 6 1 6 6   PHQ-9 Total Score -- 20 2 15 13       Flowsheet Row Video Visit from 10/05/2021 in [facility] Counselor from 09/25/2021 in [facility] Video Visit from 09/07/2021 in [facility]  C-SSRS RISK CATEGORY No Risk No Risk No Risk        Assessment and Plan: This patient is a 26 year old female with a history of anxiety and depression.  She is doing much better with her current regimen.  She will continue Xanax 1 mg 3 times daily as  well as Zoloft 50 mg daily for depression and anxiety.  She will return to see me in 6 weeks.  Collaboration of Care: Collaboration of Care: Referral or follow-up with counselor/therapist AEB patient has started therapy with 11/05/2021 in our office  Patient/Guardian was advised Release of Information must be obtained prior to any record release in order to collaborate their care with an outside provider. Patient/Guardian was advised if they have not already done so to contact the registration department to sign all necessary forms in order for 34 to release information regarding their care.   Consent: Patient/Guardian gives verbal consent for treatment and assignment of benefits for services provided during this visit. Patient/Guardian expressed understanding and agreed to proceed.    Florencia Reasons, MD 10/05/2021, 10:21 AM

## 2021-10-09 ENCOUNTER — Encounter (HOSPITAL_COMMUNITY): Payer: Self-pay

## 2021-10-09 ENCOUNTER — Other Ambulatory Visit: Payer: Self-pay

## 2021-10-09 ENCOUNTER — Ambulatory Visit (INDEPENDENT_AMBULATORY_CARE_PROVIDER_SITE_OTHER): Payer: BC Managed Care – PPO | Admitting: Psychiatry

## 2021-10-09 DIAGNOSIS — F324 Major depressive disorder, single episode, in partial remission: Secondary | ICD-10-CM

## 2021-10-09 NOTE — Progress Notes (Addendum)
IN- PERSON ? ?THERAPIST PROGRESS NOTE ? ?Session Time: Monday 10/09/2021 10:08 AM - 11:00 AM ? ?Participation Level: Active ? ?Behavioral Response: CasualAlertAnxious and Depressed ? ?Type of Therapy: Individual Therapy ? ?Treatment Goals addressed: [name] will score less than 10 on the patient health questionnaire, [name] will practice behavioral activation skills/socialization/recreational activities 1-2 times per week for the next 12 weeks ? ?ProgressTowards Goals: Initial ? ?Interventions: CBT and Supportive ? ?Summary: Kayla Sanchez is a 26 y.o. female who is referred for services by psyciatrist Dr. Tenny Craw due to pt experiencing symptoms of depression. She is a returning pt to this clinician and was seen once in 2017. Pt has had no psychiatric hospitalizations.  Patient states she does not feel like doing anything, just wants to lay in the bed.  She reports having panic attacks but says medication has helped.  Her stressors include concerns about her health, her children, and her boyfriend.  Patient reports few years of something being wrong with her especially since she was just diagnosed with high blood pressure.  She reports stress issues regarding boyfriend as he is a doctor and they have a hard time seeing each other due to his schedule.  Patient's current symptoms include panic attacks, poor motivation, excessive worry, and muscle tension. ? ?Patient last was seen about 2 weeks ago for the assessment appointment.  She continues to experience symptoms of depression and anxiety as reflected in PHQ 2 and 9/GAD-7.  Reports being more concerned about the depression as she reports anxiety is not quite as bad since she is not having as many panic attacks.  She is pleased she recently was offered a job working from home.  However, she reports worry and what if thoughts about the job.  She reports additional stress regarding relationship with her boyfriend and also reports catastrophizing thoughts about  their relationship.  She reports continued poor motivation. ? ?Suicidal/Homicidal: No/without intent, without plan ? ?Therapist Response: Reviewed symptoms, administered PHQ-9 and GAD-7, discussed stressors, facilitated expression of thoughts and feelings, validated feelings, develop therapeutic alliance, developed treatment plan, obtained patient's permission to electronically signed plan for patient, began to orient patient to CBT, assisted patient began to examine her thought patterns, used worry exploration questions to help patient examine her thoughts, discussed the role of behavioral activation in coping with depression, developed plan with patient to begin using daily planning, also provided patient with pleasant activities list, provided psychoeducation on anxiety and the stress response discussed rationale for and assisted patient practice deep breathing to trigger relaxation response, develop plan with patient to practice deep breathing 5 minutes 2 times per day ? ?Plan: Return again in 2 weeks. ? ?Diagnosis: Major depressive disorder with single episode, in partial remission (HCC) ? ?Collaboration of Care: Psychiatrist AEB by pt working with psychiatrist Dr. Tenny Craw, clinician reviewing chart/notes. ? ?Patient/Guardian was advised Release of Information must be obtained prior to any record release in order to collaborate their care with an outside provider. Patient/Guardian was advised if they have not already done so to contact the registration department to sign all necessary forms in order for [place] to release information regarding their care.  ? ?Consent: Patient/Guardian gives verbal consent for treatment and assignment of benefits for services provided during this visit. Patient/Guardian expressed understanding and agreed to proceed.  ? ?Damarion Mendizabal Debe Coder, LCSW ?10/09/2021 ? ?

## 2021-10-09 NOTE — Plan of Care (Signed)
Patient participated in development of plan. °

## 2021-10-11 NOTE — Telephone Encounter (Signed)
LMOM

## 2021-10-23 ENCOUNTER — Encounter (HOSPITAL_COMMUNITY): Payer: Self-pay

## 2021-10-23 ENCOUNTER — Ambulatory Visit (HOSPITAL_COMMUNITY): Payer: BC Managed Care – PPO | Admitting: Psychiatry

## 2021-10-23 ENCOUNTER — Telehealth (INDEPENDENT_AMBULATORY_CARE_PROVIDER_SITE_OTHER): Payer: BC Managed Care – PPO | Admitting: Psychiatry

## 2021-10-23 ENCOUNTER — Encounter (HOSPITAL_COMMUNITY): Payer: Self-pay | Admitting: Psychiatry

## 2021-10-23 ENCOUNTER — Other Ambulatory Visit: Payer: Self-pay

## 2021-10-23 DIAGNOSIS — F324 Major depressive disorder, single episode, in partial remission: Secondary | ICD-10-CM

## 2021-10-23 DIAGNOSIS — F410 Panic disorder [episodic paroxysmal anxiety] without agoraphobia: Secondary | ICD-10-CM | POA: Diagnosis not present

## 2021-10-23 MED ORDER — SERTRALINE HCL 50 MG PO TABS: 75.0000 mg | ORAL_TABLET | Freq: Every day | ORAL | 2 refills | Status: DC

## 2021-10-23 NOTE — Progress Notes (Signed)
Virtual Visit via Telephone Note ? ?I connected with Kayla Sanchez on 10/23/21 at 11:20 AM EDT by telephone and verified that I am speaking with the correct person using two identifiers. ? ?Location: ?Patient: home ?Provider: office ?  ?I discussed the limitations, risks, security and privacy concerns of performing an evaluation and management service by telephone and the availability of in person appointments. I also discussed with the patient that there may be a patient responsible charge related to this service. The patient expressed understanding and agreed to proceed. ? ? ? ?  ?I discussed the assessment and treatment plan with the patient. The patient was provided an opportunity to ask questions and all were answered. The patient agreed with the plan and demonstrated an understanding of the instructions. ?  ?The patient was advised to call back or seek an in-person evaluation if the symptoms worsen or if the condition fails to improve as anticipated. ? ?I provided 15 minutes of non-face-to-face time during this encounter. ? ? ?[name], MD ? ?BH MD/PA/NP OP Progress Note ? ?10/23/2021 11:55 AM ?Kayla Sanchez  ?MRN:  [id number] ? ?Chief Complaint:  ?Chief Complaint  ?Patient presents with  ? Depression  ? Anxiety  ? Follow-up  ? ?HPI: This patient is a 26 year old single white female who lives with her 2 children in [place].  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 ? ?The patient returns for follow-up as a work in today.  She was last seen about 3 weeks ago.  At that time she was doing much better in terms of depression and anxiety.  She stated that she had given her notice at her job and her last day was last Thursday.  Since then she has been very anxious panicky and very sad.  She supposed to start her new job this coming Thursday.  She is very worried that she will "be too sad to function."  She denies thoughts  of self-harm or suicide.  As usual she is thinking ahead and expecting the worst.  She has started therapy with Florencia Reasons.  I suggested that since she is primarily depressed we will go up on her Zoloft and she is in agreement.  She is already on Xanax 1 mg 3 times daily as needed.  She also was given propranolol by primary care.  I encouraged her to take it when she feels anxious or feels like her heart rate is going up. ?Visit Diagnosis:  ?  ICD-10-CM   ?1. Major depressive disorder with single episode, in partial remission (HCC)  F32.4   ?  ?2. Panic disorder  F41.0   ?  ? ? ?Past Psychiatric History: none ? ?Past Medical History:  ?Past Medical History:  ?Diagnosis Date  ? Anxiety   ? Depression   ? GERD (gastroesophageal reflux disease)   ?  ?Past Surgical History:  ?Procedure Laterality Date  ? WISDOM TOOTH EXTRACTION    ? ? ?Family Psychiatric History: see below ? ?Family History:  ?Family History  ?Problem Relation Age of Onset  ? Depression Mother   ? Anxiety disorder Mother   ? Drug abuse Father   ? Anxiety disorder Maternal Aunt   ? Alcohol abuse Maternal Grandfather   ? Anxiety disorder Maternal Grandfather   ? ? ?Social History:  ?Social History  ? ?Socioeconomic History  ? Marital status: Single  ?  Spouse name: Not on file  ?  Number of children: Not on file  ? Years of education: Not on file  ? Highest education level: Not on file  ?Occupational History  ? Not on file  ?Tobacco Use  ? Smoking status: Every Day  ?  Packs/day: 1.00  ?  Years: 4.00  ?  Pack years: 4.00  ?  Types: Cigarettes  ? Smokeless tobacco: Never  ?Vaping Use  ? Vaping Use: Former  ?Substance and Sexual Activity  ? Alcohol use: Yes  ?  Comment: 3 beers 1-2 x per week  ? Drug use: No  ?  Comment: 05-21-2016 per pt 2 yrs ago  ? Sexual activity: Not Currently  ?  Birth control/protection: None  ?Other Topics Concern  ? Not on file  ?Social History Narrative  ? Not on file  ? ?Social Determinants of Health  ? ?Financial Resource  Strain: Not on file  ?Food Insecurity: Not on file  ?Transportation Needs: Not on file  ?Physical Activity: Not on file  ?Stress: Not on file  ?Social Connections: Not on file  ? ? ?Allergies:  ?Allergies  ?Allergen Reactions  ? Ciprofloxacin Other (See Comments)  ?  Tendonitis   ? Penicillins Hives  ?  Has patient had a PCN reaction causing immediate rash, facial/tongue/throat swelling, SOB or lightheadedness with hypotension: No ?Has patient had a PCN reaction causing severe rash involving mucus membranes or skin necrosis: No ?Has patient had a PCN reaction that required hospitalization No ?Has patient had a PCN reaction occurring within the last 10 years: No ?If all of the above answers are "NO", then may proceed with Cephalosporin use. ?  ? Nitrofurantoin Itching  ? ? ?Metabolic Disorder Labs: ?No results found for: HGBA1C, MPG ?No results found for: PROLACTIN ?No results found for: CHOL, TRIG, HDL, CHOLHDL, VLDL, LDLCALC ?Lab Results  ?Component Value Date  ? TSH 0.635 07/31/2016  ? ? ?Therapeutic Level Labs: ?No results found for: LITHIUM ?No results found for: VALPROATE ?No components found for:  CBMZ ? ?Current Medications: ?Current Outpatient Medications  ?Medication Sig Dispense Refill  ? ALPRAZolam (XANAX) 1 MG tablet Take 1 tablet (1 mg total) by mouth 3 (three) times daily. 90 tablet 2  ? amLODipine (NORVASC) 5 MG tablet Take 5 mg by mouth daily.    ? omeprazole (PRILOSEC) 20 MG capsule Take 1 capsule (20 mg total) by mouth daily. 30 capsule 3  ? sertraline (ZOLOFT) 50 MG tablet Take 1.5 tablets (75 mg total) by mouth daily. 45 tablet 2  ? ?No current facility-administered medications for this visit.  ? ? ? ?Musculoskeletal: ?Strength & Muscle Tone: within normal limits ?Gait & Station: normal ?Patient leans: N/A ? ?Psychiatric Specialty Exam: ?Review of Systems  ?Psychiatric/Behavioral:  Positive for decreased concentration and dysphoric mood. The patient is nervous/anxious.   ?All other systems  reviewed and are negative.  ?There were no vitals taken for this visit.There is no height or weight on file to calculate BMI.  ?General Appearance: Casual and Fairly Groomed  ?Eye Contact:  Good  ?Speech:  Clear and Coherent  ?Volume:  Normal  ?Mood:  Anxious and Dysphoric  ?Affect:  Appropriate and Congruent  ?Thought Process:  Goal Directed  ?Orientation:  Full (Time, Place, and Person)  ?Thought Content: Rumination   ?Suicidal Thoughts:  No  ?Homicidal Thoughts:  No  ?Memory:  Immediate;   Good ?Recent;   Good ?Remote;   Good  ?Judgement:  Good  ?Insight:  Fair  ?Psychomotor Activity:  Decreased  ?  Concentration:  Concentration: Fair and Attention Span: Fair  ?Recall:  Good  ?Fund of Knowledge: Good  ?Language: Good  ?Akathisia:  No  ?Handed:  Right  ?AIMS (if indicated): not done  ?Assets:  Communication Skills ?Desire for Improvement ?Physical Health ?Resilience ?Social Support ?Talents/Skills  ?ADL's:  Intact  ?Cognition: WNL  ?Sleep:  Good  ? ?Screenings: ?GAD-7   ? ?Flowsheet Row Counsel[facility] Office Visit from 04/19/2016 in [facility] Office Visit from 02/28/2016 in [facility]  ?Total GAD-7 Score 18 20 19   ? ?  ? ?PHQ2-9   ? ?Flowsheet Row Video Visit from 10/23/2021 in [facility] Counselor from 10/09/2021 in [facility] Video Visit from 10/05/2021 in [facility] Counselor from 09/25/2021 in [facility] Video Visit from 09/07/2021 in [facility]  ?PHQ-2 Total Score 6 5 1 6 1   ?PHQ-9 Total Score 15 17 -- 20 2  ? ?  ? ?Flowsheet Row Video Visit from 10/23/2021 in [facility] Video Visit from 10/05/2021 in [facility] Counselor from 09/25/2021 in [facility]  ?C-SSRS RISK CATEGORY No Risk No Risk No Risk  ? ?  ? ? ? ?Assessment and Plan: This patient is a 26 year old female with

## 2021-11-06 ENCOUNTER — Ambulatory Visit (HOSPITAL_COMMUNITY): Payer: BC Managed Care – PPO | Admitting: Psychiatry

## 2021-11-07 ENCOUNTER — Telehealth (INDEPENDENT_AMBULATORY_CARE_PROVIDER_SITE_OTHER): Payer: Self-pay | Admitting: Psychiatry

## 2021-11-07 ENCOUNTER — Encounter (HOSPITAL_COMMUNITY): Payer: Self-pay | Admitting: Psychiatry

## 2021-11-07 DIAGNOSIS — F324 Major depressive disorder, single episode, in partial remission: Secondary | ICD-10-CM

## 2021-11-07 DIAGNOSIS — F410 Panic disorder [episodic paroxysmal anxiety] without agoraphobia: Secondary | ICD-10-CM

## 2021-11-07 MED ORDER — SERTRALINE HCL 50 MG PO TABS: 50.0000 mg | ORAL_TABLET | Freq: Every day | ORAL | 2 refills | Status: DC

## 2021-11-07 MED ORDER — ALPRAZOLAM 1 MG PO TABS: 1.0000 mg | ORAL_TABLET | Freq: Three times a day (TID) | ORAL | 2 refills | Status: DC

## 2021-11-07 NOTE — Progress Notes (Signed)
Virtual Visit via Video Note ? ?I connected with Kayla Sanchez on 11/07/21 at  9:40 AM EDT by a video enabled telemedicine application and verified that I am speaking with the correct person using two identifiers. ? ?Location: ?Patient: home ?Provider: office ?  ?I discussed the limitations of evaluation and management by telemedicine and the availability of in person appointments. The patient expressed understanding and agreed to proceed. ? ? ?  ?I discussed the assessment and treatment plan with the patient. The patient was provided an opportunity to ask questions and all were answered. The patient agreed with the plan and demonstrated an understanding of the instructions. ?  ?The patient was advised to call back or seek an in-person evaluation if the symptoms worsen or if the condition fails to improve as anticipated. ? ?I provided 12 minutes of non-face-to-face time during this encounter. ? ? ?Kayla Rudereborah Anet Logsdon, MD ? ?BH MD/PA/NP OP Progress Note ? ?11/07/2021 10:06 AM ?Kayla FraiseElizabeth D Frommer  ?MRN:  [id number] ? ?Chief Complaint:  ?Chief Complaint  ?Patient presents with  ? Depression  ? Anxiety  ? Follow-up  ? ?HPI: This patient is a 26 year old single white female who lives with her 2 children in [place].  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 ? ?The patient returns for follow-up after 2 weeks regarding her depression and anxiety.  Last time she is extremely anxious about starting her new job.  However she finds that she adapted quite well to the training and is doing well with the job.  She likes working from home.  She is sleeping well.  Her anxiety is much less and she is generally only using 2 Xanax a day.  She never did increase the Zoloft.  She denies significant depression thoughts of self-harm or suicidal ideation.  She is no longer having the panic attacks. ?Visit Diagnosis:  ?  ICD-10-CM   ?1. Major depressive disorder with single episode, in partial remission (HCC)  F32.4   ?  ?2. Panic  disorder  F41.0   ?  ? ? ?Past Psychiatric History: none ? ?Past Medical History:  ?Past Medical History:  ?Diagnosis Date  ? Anxiety   ? Depression   ? GERD (gastroesophageal reflux disease)   ?  ?Past Surgical History:  ?Procedure Laterality Date  ? WISDOM TOOTH EXTRACTION    ? ? ?Family Psychiatric History: See below ? ?Family History:  ?Family History  ?Problem Relation Age of Onset  ? Depression Mother   ? Anxiety disorder Mother   ? Drug abuse Father   ? Anxiety disorder Maternal Aunt   ? Alcohol abuse Maternal Grandfather   ? Anxiety disorder Maternal Grandfather   ? ? ?Social History:  ?Social History  ? ?Socioeconomic History  ? Marital status: Single  ?  Spouse name: Not on file  ? Number of children: Not on file  ? Years of education: Not on file  ? Highest education level: Not on file  ?Occupational History  ? Not on file  ?Tobacco Use  ? Smoking status: Every Day  ?  Packs/day: 1.00  ?  Years: 4.00  ?  Pack years: 4.00  ?  Types: Cigarettes  ? Smokeless tobacco: Never  ?Vaping Use  ? Vaping Use: Former  ?Substance and Sexual Activity  ? Alcohol use: Yes  ?  Comment: 3 beers 1-2 x per week  ? Drug use: No  ?  Comment: 05-21-2016 per pt 2 yrs ago  ? Sexual activity: Not  Currently  ?  Birth control/protection: None  ?Other Topics Concern  ? Not on file  ?Social History Narrative  ? Not on file  ? ?Social Determinants of Health  ? ?Financial Resource Strain: Not on file  ?Food Insecurity: Not on file  ?Transportation Needs: Not on file  ?Physical Activity: Not on file  ?Stress: Not on file  ?Social Connections: Not on file  ? ? ?Allergies:  ?Allergies  ?Allergen Reactions  ? Ciprofloxacin Other (See Comments)  ?  Tendonitis   ? Penicillins Hives  ?  Has patient had a PCN reaction causing immediate rash, facial/tongue/throat swelling, SOB or lightheadedness with hypotension: No ?Has patient had a PCN reaction causing severe rash involving mucus membranes or skin necrosis: No ?Has patient had a PCN reaction  that required hospitalization No ?Has patient had a PCN reaction occurring within the last 10 years: No ?If all of the above answers are "NO", then may proceed with Cephalosporin use. ?  ? Nitrofurantoin Itching  ? ? ?Metabolic Disorder Labs: ?No results found for: HGBA1C, MPG ?No results found for: PROLACTIN ?No results found for: CHOL, TRIG, HDL, CHOLHDL, VLDL, LDLCALC ?Lab Results  ?Component Value Date  ? TSH 0.635 07/31/2016  ? ? ?Therapeutic Level Labs: ?No results found for: LITHIUM ?No results found for: VALPROATE ?No components found for:  CBMZ ? ?Current Medications: ?Current Outpatient Medications  ?Medication Sig Dispense Refill  ? ALPRAZolam (XANAX) 1 MG tablet Take 1 tablet (1 mg total) by mouth 3 (three) times daily. 90 tablet 2  ? amLODipine (NORVASC) 5 MG tablet Take 5 mg by mouth daily.    ? omeprazole (PRILOSEC) 20 MG capsule Take 1 capsule (20 mg total) by mouth daily. 30 capsule 3  ? sertraline (ZOLOFT) 50 MG tablet Take 1 tablet (50 mg total) by mouth daily. 30 tablet 2  ? ?No current facility-administered medications for this visit.  ? ? ? ?Musculoskeletal: ?Strength & Muscle Tone: within normal limits ?Gait & Station: normal ?Patient leans: N/A ? ?Psychiatric Specialty Exam: ?Review of Systems  ?Psychiatric/Behavioral:  The patient is nervous/anxious.   ?All other systems reviewed and are negative.  ?There were no vitals taken for this visit.There is no height or weight on file to calculate BMI.  ?General Appearance: Casual and Fairly Groomed  ?Eye Contact:  Good  ?Speech:  Clear and Coherent  ?Volume:  Normal  ?Mood:  Euthymic  ?Affect:  Appropriate and Congruent  ?Thought Process:  Goal Directed  ?Orientation:  Full (Time, Place, and Person)  ?Thought Content: WDL   ?Suicidal Thoughts:  No  ?Homicidal Thoughts:  No  ?Memory:  Immediate;   Good ?Recent;   Good ?Remote;   Good  ?Judgement:  Good  ?Insight:  Fair  ?Psychomotor Activity:  Normal  ?Concentration:  Concentration: Good and  Attention Span: Good  ?Recall:  Good  ?Fund of Knowledge: Good  ?Language: Good  ?Akathisia:  No  ?Handed:  Right  ?AIMS (if indicated): not done  ?Assets:  Communication Skills ?Desire for Improvement ?Physical Health ?Resilience ?Social Support ?Talents/Skills  ?ADL's:  Intact  ?Cognition: WNL  ?Sleep:  Good  ? ?Screenings: ?GAD-7   ? ?Flowsheet Row Counselor from 10/09[facility]2021 [facility] Office Visit from 04/19/[facility] in [facility] Office Visit from 02/28/2016 in [facility]  ?Total GAD-7 Score 18 20 19   ? ?  ? ?PHQ2-9   ? ?Flowsheet Row Video Visit from 11/07/2021 [facility] Video Visit from 10/23/2021 in [facility] Counselor from 10/09/2021 in [facility] Video Visit from 10/05/2021 in [facility] Counselor from 09/25/2021 in [facility]  ?PHQ-2 Total Score 1 6 5 1 6   ?PHQ-9 Total Score -- 15 17 -- 20  ? ?  ? ?Flowsheet Row Video Visit from 11/07/2021 in [facility] Video Visit from 10/23/2021 in [facility] Video Visit from 10/05/2021 in [facility]  ?C-SSRS RISK CATEGORY No Risk No Risk No Risk  ? ?  ? ? ? ?Assessment and Plan: This patient is a 26 year old female with a history of anxiety depression.  Now that she is got settled in her new job she is doing much better.  She never made the increase in Zoloft but thinks she is doing well on the 50 mg for depression so we will continue this as well as Xanax 1 mg 2-3 times daily as needed.  She will return to see me in 4 weeks ? ?Collaboration of Care: Collaboration of Care: Referral or follow-up with counselor/therapist AEB patient is receiving  follow-up with 26, therapist in our office ? ?Patient/Guardian was advised Release of Information must be obtained prior to any record release in order to collaborate their care with an outside provi

## 2021-12-19 DIAGNOSIS — Z6834 Body mass index (BMI) 34.0-34.9, adult: Secondary | ICD-10-CM | POA: Diagnosis not present

## 2021-12-19 DIAGNOSIS — D485 Neoplasm of uncertain behavior of skin: Secondary | ICD-10-CM | POA: Diagnosis not present

## 2021-12-19 DIAGNOSIS — Z01419 Encounter for gynecological examination (general) (routine) without abnormal findings: Secondary | ICD-10-CM | POA: Diagnosis not present

## 2021-12-19 DIAGNOSIS — L680 Hirsutism: Secondary | ICD-10-CM | POA: Diagnosis not present

## 2021-12-19 DIAGNOSIS — Z872 Personal history of diseases of the skin and subcutaneous tissue: Secondary | ICD-10-CM | POA: Diagnosis not present

## 2021-12-19 DIAGNOSIS — L700 Acne vulgaris: Secondary | ICD-10-CM | POA: Diagnosis not present

## 2021-12-19 DIAGNOSIS — Z87410 Personal history of cervical dysplasia: Secondary | ICD-10-CM | POA: Diagnosis not present

## 2021-12-19 DIAGNOSIS — I10 Essential (primary) hypertension: Secondary | ICD-10-CM | POA: Diagnosis not present

## 2021-12-19 DIAGNOSIS — D225 Melanocytic nevi of trunk: Secondary | ICD-10-CM | POA: Diagnosis not present

## 2021-12-19 DIAGNOSIS — Z113 Encounter for screening for infections with a predominantly sexual mode of transmission: Secondary | ICD-10-CM | POA: Diagnosis not present

## 2022-01-01 ENCOUNTER — Encounter (HOSPITAL_COMMUNITY): Payer: Self-pay

## 2022-01-01 NOTE — Telephone Encounter (Signed)
Appointment was scheduled with patient

## 2022-01-04 ENCOUNTER — Telehealth (INDEPENDENT_AMBULATORY_CARE_PROVIDER_SITE_OTHER): Payer: BC Managed Care – PPO | Admitting: Psychiatry

## 2022-01-04 ENCOUNTER — Encounter (HOSPITAL_COMMUNITY): Payer: Self-pay | Admitting: Psychiatry

## 2022-01-04 DIAGNOSIS — F410 Panic disorder [episodic paroxysmal anxiety] without agoraphobia: Secondary | ICD-10-CM

## 2022-01-04 DIAGNOSIS — F411 Generalized anxiety disorder: Secondary | ICD-10-CM | POA: Diagnosis not present

## 2022-01-04 DIAGNOSIS — N921 Excessive and frequent menstruation with irregular cycle: Secondary | ICD-10-CM | POA: Diagnosis not present

## 2022-01-04 DIAGNOSIS — F324 Major depressive disorder, single episode, in partial remission: Secondary | ICD-10-CM

## 2022-01-04 DIAGNOSIS — N920 Excessive and frequent menstruation with regular cycle: Secondary | ICD-10-CM | POA: Diagnosis not present

## 2022-01-04 MED ORDER — SERTRALINE HCL 50 MG PO TABS: 75.0000 mg | ORAL_TABLET | Freq: Every day | ORAL | 2 refills | Status: DC

## 2022-01-04 MED ORDER — ALPRAZOLAM 1 MG PO TABS
1.0000 mg | ORAL_TABLET | Freq: Three times a day (TID) | ORAL | 2 refills | Status: DC
Start: 2022-01-04 — End: 2022-02-23

## 2022-01-04 NOTE — Progress Notes (Signed)
Virtual Visit via Video Note  I connected with Kayla Sanchez on 01/04/22 at 11:20 AM EDT by a video enabled telemedicine application and verified that I am speaking with the correct person using two identifiers.  Location: Patient: home Provider: office   I discussed the limitations of evaluation and management by telemedicine and the availability of in person appointments. The patient expressed understanding and agreed to proceed.     I discussed the assessment and treatment plan with the patient. The patient was provided an opportunity to ask questions and all were answered. The patient agreed with the plan and demonstrated an understanding of the instructions.   The patient was advised to call back or seek an in-person evaluation if the symptoms worsen or if the condition fails to improve as anticipated.  I provided 15 minutes of non-face-to-face time during this encounter.   [name], MD  [facility] MD/PA/NP OP Progress Note  01/04/2022 11:36 AM Kayla Sanchez [place]  MRN:  [id number]  Chief Complaint:  Chief Complaint  Patient presents with   Depression   Anxiety   Follow-up   HPI: This patient is a 26 year old single white female who lives with her 2 children in [place].  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  The patient returns for follow-up after 2 months regarding her depression and anxiety.  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  She states that she has constant headaches.  Ibuprofen is not really helping.  This gets her worried that she is "having a stroke."  I explained that this is very unlikely at her age.  She does smoke 1 pack a day and I explained that this was her major risk factor.  She has been having more anxiety lately.  Her primary doctor suggested an increase in the Zoloft to help decrease her anxiety and I think this is reasonable.  She is still only on  the 50 mg.  She denies thoughts of self-harm or suicidal ideation.  She does not seem to have much motivation or energy to do much outside of work. Visit Diagnosis:    ICD-10-CM   1. Major depressive disorder with single episode, in partial remission (HCC)  F32.4     2. Panic disorder  F41.0     3. GAD (generalized anxiety disorder)  F41.1       Past Psychiatric History: none  Past Medical History:  Past Medical History:  Diagnosis Date   Anxiety    Depression    GERD (gastroesophageal reflux disease)     Past Surgical History:  Procedure Laterality Date   WISDOM TOOTH EXTRACTION      Family Psychiatric History: see below  Family History:  Family History  Problem Relation Age of Onset   Depression Mother    Anxiety disorder Mother    Drug abuse Father    Anxiety disorder Maternal Aunt    Alcohol abuse Maternal Grandfather    Anxiety disorder Maternal Grandfather     Social History:  Social History   Socioeconomic History   Marital status: Single    Spouse name: Not on file   Number of children: Not on file   Years of education: Not on file   Highest education level: Not on file  Occupational History   Not on file  Tobacco Use   Smoking status: Every Day    Packs/day:  1.00    Years: 4.00    Total pack years: 4.00    Types: Cigarettes   Smokeless tobacco: Never  Vaping Use   Vaping Use: Former  Substance and Sexual Activity   Alcohol use: Yes    Comment: 3 beers 1-2 x per week   Drug use: No    Comment: 05-21-2016 per pt 2 yrs ago   Sexual activity: Not Currently    Birth control/protection: None  Other Topics Concern   Not on file  Social History Narrative   Not on file   Social Determinants of Health   Financial Resource Strain: Not on file  Food Insecurity: Not on file  Transportation Needs: Not on file  Physical Activity: Not on file  Stress: Not on file  Social Connections: Not on file    Allergies:  Allergies  Allergen Reactions    Ciprofloxacin Other (See Comments)    Tendonitis    Penicillins Hives    Has patient had a PCN reaction causing immediate rash, facial/tongue/throat swelling, SOB or lightheadedness with hypotension: No Has patient had a PCN reaction causing severe rash involving mucus membranes or skin necrosis: No Has patient had a PCN reaction that required hospitalization No Has patient had a PCN reaction occurring within the last 10 years: No If all of the above answers are "NO", then may proceed with Cephalosporin use.    Nitrofurantoin Itching    Metabolic Disorder Labs: No results found for: "HGBA1C", "MPG" No results found for: "PROLACTIN" No results found for: "CHOL", "TRIG", "HDL", "CHOLHDL", "VLDL", "LDLCALC" Lab Results  Component Value Date   TSH 0.635 07/31/2016    Therapeutic Level Labs: No results found for: "LITHIUM" No results found for: "VALPROATE" No results found for: "CBMZ"  Current Medications: Current Outpatient Medications  Medication Sig Dispense Refill   ALPRAZolam (XANAX) 1 MG tablet Take 1 tablet (1 mg total) by mouth 3 (three) times daily. 90 tablet 2   amLODipine (NORVASC) 5 MG tablet Take 5 mg by mouth daily.     omeprazole (PRILOSEC) 20 MG capsule Take 1 capsule (20 mg total) by mouth daily. 30 capsule 3   sertraline (ZOLOFT) 50 MG tablet Take 1.5 tablets (75 mg total) by mouth daily. 45 tablet 2   No current facility-administered medications for this visit.     Musculoskeletal: Strength & Muscle Tone: within normal limits Gait & Station: normal Patient leans: N/A  Psychiatric Specialty Exam: Review of Systems  Neurological:  Positive for headaches.  Psychiatric/Behavioral:  Positive for dysphoric mood. The patient is nervous/anxious.   All other systems reviewed and are negative.   There were no vitals taken for this visit.There is no height or weight on file to calculate BMI.  General Appearance: Casual, Neat, and Well Groomed  Eye Contact:  Good   Speech:  Clear and Coherent  Volume:  Normal  Mood:  Anxious and Dysphoric  Affect:  Flat  Thought Process:  Goal Directed  Orientation:  Full (Time, Place, and Person)  Thought Content: Rumination   Suicidal Thoughts:  No  Homicidal Thoughts:  No  Memory:  Immediate;   Good Recent;   Good Remote;   Good  Judgement:  Good  Insight:  Fair  Psychomotor Activity:  Decreased  Concentration:  Concentration: Good and Attention Span: Good  Recall:  Good  Fund of Knowledge: Good  Language: Good  Akathisia:  No  Handed:  Right  AIMS (if indicated): not done  Assets:  Communication Skills Desire  for Improvement Physical Health Resilience Social Support Talents/Skills  ADL's:  Intact  Cognition: WNL  Sleep:  Good   Screenings: GAD-7    Flowsheet Row Counselor [facility] Office Visit from 04/19/2016 in [facility] Office Visit from 02/28/2016 in [facility]  Total GAD-7 Score 18 20 19       PHQ2-9    Flowsheet Row Video Visit from 01/04/2022 in [facility] Video Visit from 11/07/2021 in [facility] Video Visit from 10/23/2021 in [facility] Counselor from 10/09/2021 in [facility] Video Visit from 10/05/2021 in [facility]  PHQ-2 Total Score 2 1 6 5 1   PHQ-9 Total Score 5 -- 15 17 --      Flowsheet Row Video Visit from 01/04/2022 in [facility] Video Visit from 11/07/2021 in [facility] Video Visit from 10/23/2021 in [facility]  C-SSRS RISK CATEGORY No Risk No Risk No Risk        Assessment and Plan: This patient is a 26 year old female with  a history of anxiety and depression.  Her job is tiring since she is in front of a computer screen all day.  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  She is having frequent headaches and I urged her to talk to her primary doctor about this.  Since she is still somewhat depressed and anxious we will increase Zoloft from 50 to 75 mg daily.  She will continue Xanax 1 mg 2-3 times daily as needed.  She will return to see me in 6 weeks.  Collaboration of Care: Collaboration of Care: Referral or follow-up with counselor/therapist AEB patient is following up in therapy with 10/25/2021 in our office  Patient/Guardian was advised Release of Information must be obtained prior to any record release in order to collaborate their care with an outside provider. Patient/Guardian was advised if they have not already done so to contact the registration department to sign all necessary forms in order for 26 to release information regarding their care.   Consent: Patient/Guardian gives verbal consent for treatment and assignment of benefits for services provided during this visit. Patient/Guardian expressed understanding and agreed to proceed.    Florencia Reasons, MD 01/04/2022, 11:36 AM

## 2022-02-05 ENCOUNTER — Ambulatory Visit (HOSPITAL_COMMUNITY): Payer: BC Managed Care – PPO | Admitting: Psychiatry

## 2022-02-05 ENCOUNTER — Telehealth (HOSPITAL_COMMUNITY): Payer: Self-pay | Admitting: Psychiatry

## 2022-02-05 NOTE — Telephone Encounter (Signed)
Therapist attempted to contact patient via text through caregility platform for scheduled appointment, no response.  Therapist called patient, left message indicating attempt, and requesting patient call office. 

## 2022-02-23 ENCOUNTER — Encounter (HOSPITAL_COMMUNITY): Payer: Self-pay | Admitting: Psychiatry

## 2022-02-23 ENCOUNTER — Telehealth (INDEPENDENT_AMBULATORY_CARE_PROVIDER_SITE_OTHER): Payer: BC Managed Care – PPO | Admitting: Psychiatry

## 2022-02-23 DIAGNOSIS — F411 Generalized anxiety disorder: Secondary | ICD-10-CM

## 2022-02-23 DIAGNOSIS — F324 Major depressive disorder, single episode, in partial remission: Secondary | ICD-10-CM | POA: Diagnosis not present

## 2022-02-23 DIAGNOSIS — F410 Panic disorder [episodic paroxysmal anxiety] without agoraphobia: Secondary | ICD-10-CM | POA: Diagnosis not present

## 2022-02-23 MED ORDER — SERTRALINE HCL 100 MG PO TABS: 100.0000 mg | ORAL_TABLET | Freq: Every day | ORAL | 2 refills | Status: DC

## 2022-02-23 MED ORDER — ALPRAZOLAM 1 MG PO TABS
1.0000 mg | ORAL_TABLET | Freq: Three times a day (TID) | ORAL | 2 refills | Status: DC
Start: 2022-02-23 — End: 2022-05-04

## 2022-02-23 NOTE — Progress Notes (Signed)
Virtual Visit via Video Note  I connected with ROSILAND Sanchez on 02/23/22 at 10:00 AM EDT by a video enabled telemedicine application and verified that I am speaking with the correct person using two identifiers.  Location: Patient: home Provider: home office   I discussed the limitations of evaluation and management by telemedicine and the availability of in person appointments. The patient expressed understanding and agreed to proceed.     I discussed the assessment and treatment plan with the patient. The patient was provided an opportunity to ask questions and all were answered. The patient agreed with the plan and demonstrated an understanding of the instructions.   The patient was advised to call back or seek an in-person evaluation if the symptoms worsen or if the condition fails to improve as anticipated.  I provided 15 minutes of non-face-to-face time during this encounter.   [name], MD  [facility] MD/PA/NP OP Progress Note  02/23/2022 10:24 AM Kayla Sanchez  MRN:  [id number]  Chief Complaint: depression, anxiety HPI: This patient is a 26 year old single white female who lives with her 2 children in [place].  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  Here for follow-up after 6 weeks regarding her depression and anxiety.  She states that she is doing a little bit better.  She never did increase the Zoloft 75 mg because she does not like breaking the pills in half.  She states however she is not currently depressed that she is got more used to her new job.  She had some dental issues corrected and now she is having less headaches.  She still has a fair amount of anxiety and I suggest that we go to the 100 mg Zoloft to help with this.  She is still using the Xanax every day.  Her affect seems much brighter however and she seems less stressed.  She denies thoughts of self-harm or suicidal ideation.  She is no longer so obsessional about illnesses or thoughts that  she might be dying of something Visit Diagnosis:    ICD-10-CM   1. Major depressive disorder with single episode, in partial remission (HCC)  F32.4     2. Panic disorder  F41.0     3. GAD (generalized anxiety disorder)  F41.1       Past Psychiatric History: none  Past Medical History:  Past Medical History:  Diagnosis Date   Anxiety    Depression    GERD (gastroesophageal reflux disease)     Past Surgical History:  Procedure Laterality Date   WISDOM TOOTH EXTRACTION      Family Psychiatric History: see below  Family History:  Family History  Problem Relation Age of Onset   Depression Mother    Anxiety disorder Mother    Drug abuse Father    Anxiety disorder Maternal Aunt    Alcohol abuse Maternal Grandfather    Anxiety disorder Maternal Grandfather     Social History:  Social History   Socioeconomic History   Marital status: Single    Spouse name: Not on file   Number of children: Not on file   Years of education: Not on file   Highest education level: Not on file  Occupational History   Not on file  Tobacco Use   Smoking status: Every Day    Packs/day: 1.00    Years: 4.00    Total pack years: 4.00    Types: Cigarettes   Smokeless tobacco: Never  Vaping Use  Vaping Use: Former  Substance and Sexual Activity   Alcohol use: Yes    Comment: 3 beers 1-2 x per week   Drug use: No    Comment: 05-21-2016 per pt 2 yrs ago   Sexual activity: Not Currently    Birth control/protection: None  Other Topics Concern   Not on file  Social History Narrative   Not on file   Social Determinants of Health   Financial Resource Strain: Not on file  Food Insecurity: Not on file  Transportation Needs: Not on file  Physical Activity: Not on file  Stress: Not on file  Social Connections: Not on file    Allergies:  Allergies  Allergen Reactions   Ciprofloxacin Other (See Comments)    Tendonitis    Penicillins Hives    Has patient had a PCN reaction causing  immediate rash, facial/tongue/throat swelling, SOB or lightheadedness with hypotension: No Has patient had a PCN reaction causing severe rash involving mucus membranes or skin necrosis: No Has patient had a PCN reaction that required hospitalization No Has patient had a PCN reaction occurring within the last 10 years: No If all of the above answers are "NO", then may proceed with Cephalosporin use.    Nitrofurantoin Itching    Metabolic Disorder Labs: No results found for: "HGBA1C", "MPG" No results found for: "PROLACTIN" No results found for: "CHOL", "TRIG", "HDL", "CHOLHDL", "VLDL", "LDLCALC" Lab Results  Component Value Date   TSH 0.635 07/31/2016    Therapeutic Level Labs: No results found for: "LITHIUM" No results found for: "VALPROATE" No results found for: "CBMZ"  Current Medications: Current Outpatient Medications  Medication Sig Dispense Refill   sertraline (ZOLOFT) 100 MG tablet Take 1 tablet (100 mg total) by mouth daily. 30 tablet 2   ALPRAZolam (XANAX) 1 MG tablet Take 1 tablet (1 mg total) by mouth 3 (three) times daily. 90 tablet 2   amLODipine (NORVASC) 5 MG tablet Take 5 mg by mouth daily.     omeprazole (PRILOSEC) 20 MG capsule Take 1 capsule (20 mg total) by mouth daily. 30 capsule 3   No current facility-administered medications for this visit.     Musculoskeletal: Strength & Muscle Tone: within normal limits Gait & Station: normal Patient leans: N/A  Psychiatric Specialty Exam: Review of Systems  Psychiatric/Behavioral:  The patient is nervous/anxious.   All other systems reviewed and are negative.   There were no vitals taken for this visit.There is no height or weight on file to calculate BMI.  General Appearance: Casual and Fairly Groomed  Eye Contact:  Good  Speech:  Clear and Coherent  Volume:  Normal  Mood:  Anxious and Euthymic  Affect:  Appropriate and Congruent  Thought Process:  Goal Directed  Orientation:  Full (Time, Place, and  Person)  Thought Content: Rumination   Suicidal Thoughts:  No  Homicidal Thoughts:  No  Memory:  Immediate;   Good Recent;   Good Remote;   Good  Judgement:  Good  Insight:  Good  Psychomotor Activity:  Normal  Concentration:  Concentration: Good and Attention Span: Good  Recall:  Good  Fund of Knowledge: Good  Language: Good  Akathisia:  No  Handed:  Right  AIMS (if indicated): not done  Assets:  Communication Skills Desire for Improvement Physical Health Resilience Social Support Talents/Skills  ADL's:  Intact  Cognition: WNL  Sleep:  Good   Screenings: GAD-7    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 [facility]  Office Visit from 04/19/2016 in [facility] Office Visit from 02/28/2016 in [facility]  Total GAD-7 Score 18 20 19       PHQ2-9    Flowsheet Row Video Visit from 02/23/2022 in [facility] Video Visit from 01/04/2022 in [facility] Video Visit from 11/07/2021 in [facility] Video Visit from 10/23/2021 in [facility] Counselor from 10/09/2021 in [facility]  PHQ-2 Total Score 0 2 1 6 5   PHQ-9 Total Score -- 5 -- 15 17      Flowsheet Row Video Visit from 02/23/2022 in [facility] Video Visit from 01/04/2022 in [facility] Video Visit from 11/07/2021 in [facility]  C-SSRS RISK CATEGORY No Risk No Risk No Risk        Assessment and Plan: This patient is a 26 year old female with a history of anxiety and depression.  She is still somewhat anxious so we will increase Zoloft from 50 to 100 mg daily.  She will continue Xanax 1 mg 2-3 times daily as  needed.  She will return to see me in 2 months  Collaboration of Care: Collaboration of Care: Primary Care Provider AEB notes will be released to PCP at patient's request  Patient/Guardian was advised Release of Information must be obtained prior to any record release in order to collaborate their care with an outside provider. Patient/Guardian was advised if they have not already done so to contact the registration department to sign all necessary forms in order for 01/07/2022 to release information regarding their care.   Consent: Patient/Guardian gives verbal consent for treatment and assignment of benefits for services provided during this visit. Patient/Guardian expressed understanding and agreed to proceed.    26, MD 02/23/2022, 10:24 AM

## 2022-04-24 NOTE — Progress Notes (Signed)
No show

## 2022-05-04 ENCOUNTER — Telehealth (HOSPITAL_COMMUNITY): Payer: BC Managed Care – PPO | Admitting: Psychiatry

## 2022-05-04 ENCOUNTER — Encounter (HOSPITAL_COMMUNITY): Payer: Self-pay | Admitting: Psychiatry

## 2022-05-04 DIAGNOSIS — F324 Major depressive disorder, single episode, in partial remission: Secondary | ICD-10-CM | POA: Diagnosis not present

## 2022-05-04 DIAGNOSIS — F410 Panic disorder [episodic paroxysmal anxiety] without agoraphobia: Secondary | ICD-10-CM | POA: Diagnosis not present

## 2022-05-04 DIAGNOSIS — F411 Generalized anxiety disorder: Secondary | ICD-10-CM

## 2022-05-04 MED ORDER — BUPROPION HCL ER (XL) 150 MG PO TB24: 150.0000 mg | ORAL_TABLET | ORAL | 2 refills | Status: DC

## 2022-05-04 MED ORDER — SERTRALINE HCL 50 MG PO TABS: 50.0000 mg | ORAL_TABLET | Freq: Every day | ORAL | 2 refills | Status: DC

## 2022-05-04 MED ORDER — ALPRAZOLAM 1 MG PO TABS: 1.0000 mg | ORAL_TABLET | Freq: Three times a day (TID) | ORAL | 2 refills | Status: DC

## 2022-05-04 NOTE — Progress Notes (Signed)
Virtual Visit via Video Note  I connected with Kayla Sanchez on 05/04/22 at  9:00 AM EDT by a video enabled telemedicine application and verified that I am speaking with the correct person using two identifiers.  Location: Patient: home Provider: office   I discussed the limitations of evaluation and management by telemedicine and the availability of in person appointments. The patient expressed understanding and agreed to proceed.     I discussed the assessment and treatment plan with the patient. The patient was provided an opportunity to ask questions and all were answered. The patient agreed with the plan and demonstrated an understanding of the instructions.   The patient was advised to call back or seek an in-person evaluation if the symptoms worsen or if the condition fails to improve as anticipated.  I provided 20 minutes of non-face-to-face time during this encounter.   Levonne Spiller, MD  [facility] MD/PA/NP OP Progress Note  05/04/2022 9:27 AM Kayla Sanchez  MRN:  [id number]  Chief Complaint:  Chief Complaint  Patient presents with   Anxiety   Depression   Follow-up   HPI: This patient is a 26 year old single white female who lives with her 2 children in [place].  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  Patient returns for follow-up after 2 months regarding her depression and anxiety.  Last time we tried increase Zoloft to 100 mg but it made her too jittery so she is back to 50 mg.  She has not seen a lot of result from it.  She is doing okay with her job but she just does not feel like doing anything also leaving the house.  This seems to be upsetting her children because they want to get out and do things.  She states that she has little energy or motivation.  She is still anxious a good deal of the time but the Xanax helped.  She is not sure the Zoloft has done much for her.  Since she is having low energy and motivation I suggested adding Wellbutrin  and she is in agreement as she also wants to quit smoking.  She denies any thoughts of self-harm or suicide Visit Diagnosis:    ICD-10-CM   1. Major depressive disorder with single episode, in partial remission ([place])  F32.4     2. GAD (generalized anxiety disorder)  F41.1     3. Panic disorder  F41.0       Past Psychiatric History: none  Past Medical History:  Past Medical History:  Diagnosis Date   Anxiety    Depression    GERD (gastroesophageal reflux disease)     Past Surgical History:  Procedure Laterality Date   WISDOM TOOTH EXTRACTION      Family Psychiatric History: see below  Family History:  Family History  Problem Relation Age of Onset   Depression Mother    Anxiety disorder Mother    Drug abuse Father    Anxiety disorder Maternal Aunt    Alcohol abuse Maternal Grandfather    Anxiety disorder Maternal Grandfather     Social History:  Social History   Socioeconomic History   Marital status: Single    Spouse name: Not on file   Number of children: Not on file   Years of education: Not on file   Highest education level: Not on file  Occupational History   Not on file  Tobacco Use   Smoking status: Every Day    Packs/day: 1.00  Years: 4.00    Total pack years: 4.00    Types: Cigarettes   Smokeless tobacco: Never  Vaping Use   Vaping Use: Former  Substance and Sexual Activity   Alcohol use: Yes    Comment: 3 beers 1-2 x per week   Drug use: No    Comment: 05-21-2016 per pt 2 yrs ago   Sexual activity: Not Currently    Birth control/protection: None  Other Topics Concern   Not on file  Social History Narrative   Not on file   Social Determinants of Health   Financial Resource Strain: Not on file  Food Insecurity: Not on file  Transportation Needs: Not on file  Physical Activity: Not on file  Stress: Not on file  Social Connections: Not on file    Allergies:  Allergies  Allergen Reactions   Ciprofloxacin Other (See Comments)     Tendonitis    Penicillins Hives    Has patient had a PCN reaction causing immediate rash, facial/tongue/throat swelling, SOB or lightheadedness with hypotension: No Has patient had a PCN reaction causing severe rash involving mucus membranes or skin necrosis: No Has patient had a PCN reaction that required hospitalization No Has patient had a PCN reaction occurring within the last 10 years: No If all of the above answers are "NO", then may proceed with Cephalosporin use.    Nitrofurantoin Itching    Metabolic Disorder Labs: No results found for: "HGBA1C", "MPG" No results found for: "PROLACTIN" No results found for: "CHOL", "TRIG", "HDL", "CHOLHDL", "VLDL", "LDLCALC" Lab Results  Component Value Date   TSH 0.635 07/31/2016    Therapeutic Level Labs: No results found for: "LITHIUM" No results found for: "VALPROATE" No results found for: "CBMZ"  Current Medications: Current Outpatient Medications  Medication Sig Dispense Refill   buPROPion (WELLBUTRIN XL) 150 MG 24 hr tablet Take 1 tablet (150 mg total) by mouth every morning. 30 tablet 2   sertraline (ZOLOFT) 50 MG tablet Take 1 tablet (50 mg total) by mouth daily. 30 tablet 2   ALPRAZolam (XANAX) 1 MG tablet Take 1 tablet (1 mg total) by mouth 3 (three) times daily. 90 tablet 2   amLODipine (NORVASC) 5 MG tablet Take 5 mg by mouth daily.     omeprazole (PRILOSEC) 20 MG capsule Take 1 capsule (20 mg total) by mouth daily. 30 capsule 3   No current facility-administered medications for this visit.     Musculoskeletal: Strength & Muscle Tone: within normal limits Gait & Station: normal Patient leans: N/A  Psychiatric Specialty Exam: Review of Systems  Psychiatric/Behavioral:  Positive for dysphoric mood. The patient is nervous/anxious.   All other systems reviewed and are negative.   There were no vitals taken for this visit.There is no height or weight on file to calculate BMI.  General Appearance: Casual and Fairly  Groomed  Eye Contact:  Good  Speech:  Clear and Coherent  Volume:  Normal  Mood:  Anxious  Affect:  Congruent  Thought Process:  Goal Directed  Orientation:  Full (Time, Place, and Person)  Thought Content: WDL   Suicidal Thoughts:  No  Homicidal Thoughts:  No  Memory:  Immediate;   Good Recent;   Good Remote;   Good  Judgement:  Good  Insight:  Fair  Psychomotor Activity:  Decreased  Concentration:  Concentration: Good and Attention Span: Good  Recall:  Good  Fund of Knowledge: Good  Language: Good  Akathisia:  No  Handed:  Right  AIMS (  if indicated): not done  Assets:  Communication Skills Desire for Improvement Physical Health Resilience Social Support Talents/Skills  ADL's:  Intact  Cognition: WNL  Sleep:  Good   Screenings: GAD-7    Flowsheet Row Counsel[facility] Office Visit from 04/19/2016 in [facility] Office Visit from 02/28/2016 in [facility]  Total GAD-7 Score 18 20 19       PHQ2-9    Flowsheet Row Video Visit from 05/04/2022 in [facility] Video Visit from 02/23/2022 in [facility] Video Visit from 01/04/2022 in [facility] Video Visit from 11/07/2021 in [facility] Video Visit from 10/23/2021 in [facility]  PHQ-2 Total Score 2 0 2 1 6   PHQ-9 Total Score 5 -- 5 -- 15      Flowsheet Row Video Visit from 05/04/2022 in [facility] Video Visit from 02/23/2022 in [facility] Video Visit from 01/04/2022 in [facility]  C-SSRS RISK CATEGORY No Risk No Risk No Risk        Assessment and Plan: Patient is a 26 year old  female with a history of anxiety and depression.  She feels slow and lethargic so we will add Wellbutrin XL 150 mg to the Zoloft 50 mg daily for depression.  She will continue Xanax 1 mg 2-3 times a day as needed for anxiety.  She will return to see me in 4 weeks  Collaboration of Care: Collaboration of Care: Primary Care Provider AEB notes will be released to PCP at patient's request  Patient/Guardian was advised Release of Information must be obtained prior to any record release in order to collaborate their care with an outside provider. Patient/Guardian was advised if they have not already done so to contact the registration department to sign all necessary forms in order for 03/06/2022 to release information regarding their care.   Consent: Patient/Guardian gives verbal consent for treatment and assignment of benefits for services provided during this visit. Patient/Guardian expressed understanding and agreed to proceed.    26, MD 05/04/2022, 9:27 AM

## 2022-06-01 ENCOUNTER — Encounter (HOSPITAL_COMMUNITY): Payer: Self-pay | Admitting: Psychiatry

## 2022-06-01 ENCOUNTER — Telehealth (INDEPENDENT_AMBULATORY_CARE_PROVIDER_SITE_OTHER): Payer: BC Managed Care – PPO | Admitting: Psychiatry

## 2022-06-01 DIAGNOSIS — F411 Generalized anxiety disorder: Secondary | ICD-10-CM

## 2022-06-01 DIAGNOSIS — F324 Major depressive disorder, single episode, in partial remission: Secondary | ICD-10-CM | POA: Diagnosis not present

## 2022-06-01 MED ORDER — ALPRAZOLAM 1 MG PO TABS
1.0000 mg | ORAL_TABLET | Freq: Three times a day (TID) | ORAL | 2 refills | Status: DC
Start: 2022-06-01 — End: 2022-09-05

## 2022-06-01 MED ORDER — BUPROPION HCL ER (XL) 150 MG PO TB24: 150.0000 mg | ORAL_TABLET | ORAL | 2 refills | Status: DC

## 2022-06-01 MED ORDER — SERTRALINE HCL 50 MG PO TABS: 50.0000 mg | ORAL_TABLET | Freq: Every day | ORAL | 2 refills | Status: DC

## 2022-06-01 NOTE — Progress Notes (Signed)
Virtual Visit via Video Note  I connected with Kayla Sanchez on 06/01/22 at  9:20 AM EDT by a video enabled telemedicine application and verified that I am speaking with the correct person using two identifiers.  Location: Patient: home Provider: home office   I discussed the limitations of evaluation and management by telemedicine and the availability of in person appointments. The patient expressed understanding and agreed to proceed.    I discussed the assessment and treatment plan with the patient. The patient was provided an opportunity to ask questions and all were answered. The patient agreed with the plan and demonstrated an understanding of the instructions.   The patient was advised to call back or seek an in-person evaluation if the symptoms worsen or if the condition fails to improve as anticipated.  I provided 20 minutes of non-face-to-face time during this encounter.   Levonne Spiller, MD  [facility] MD/PA/NP OP Progress Note  06/01/2022 9:41 AM Kayla Sanchez  MRN:  [id number]  Chief Complaint:  Chief Complaint  Patient presents with   Anxiety   Depression   Follow-up   HPI: This patient is a 26 year old single white female who lives with her 2 children in [place].  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  The patient returns for follow-up after 4 weeks regarding her depression and anxiety.  Last time she seemed more depressed and did not feel like doing anything and her energy was quite low.  I added Wellbutrin to her regimen but she claims she read about the side effects like potential seizures and she never picked it up.  I explained that this was only true in people who have a propensity for seizure history of seizure.  She states that she will give it a try.  For what ever reason she is feeling slightly better and has been getting out a little bit more with her children.  She denies significant anxiety right now.  She is still smoking 1 pack  a day and I explained that Wellbutrin might help with this as well.  She is sleeping a lot but still feels tired.  She is not getting that much fresh air but is trying to do better.  She denies any thoughts of self-harm or suicide Visit Diagnosis:    ICD-10-CM   1. Major depressive disorder with single episode, in partial remission ([place])  F32.4     2. GAD (generalized anxiety disorder)  F41.1       Past Psychiatric History: None  Past Medical History:  Past Medical History:  Diagnosis Date   Anxiety    Depression    GERD (gastroesophageal reflux disease)     Past Surgical History:  Procedure Laterality Date   WISDOM TOOTH EXTRACTION      Family Psychiatric History: See below  Family History:  Family History  Problem Relation Age of Onset   Depression Mother    Anxiety disorder Mother    Drug abuse Father    Anxiety disorder Maternal Aunt    Alcohol abuse Maternal Grandfather    Anxiety disorder Maternal Grandfather     Social History:  Social History   Socioeconomic History   Marital status: Single    Spouse name: Not on file   Number of children: Not on file   Years of education: Not on file   Highest education level: Not on file  Occupational History   Not on file  Tobacco Use   Smoking status:  Every Day    Packs/day: 1.00    Years: 4.00    Total pack years: 4.00    Types: Cigarettes   Smokeless tobacco: Never  Vaping Use   Vaping Use: Former  Substance and Sexual Activity   Alcohol use: Yes    Comment: 3 beers 1-2 x per week   Drug use: No    Comment: 05-21-2016 per pt 2 yrs ago   Sexual activity: Not Currently    Birth control/protection: None  Other Topics Concern   Not on file  Social History Narrative   Not on file   Social Determinants of Health   Financial Resource Strain: Not on file  Food Insecurity: Not on file  Transportation Needs: Not on file  Physical Activity: Not on file  Stress: Not on file  Social Connections: Not on file     Allergies:  Allergies  Allergen Reactions   Ciprofloxacin Other (See Comments)    Tendonitis    Penicillins Hives    Has patient had a PCN reaction causing immediate rash, facial/tongue/throat swelling, SOB or lightheadedness with hypotension: No Has patient had a PCN reaction causing severe rash involving mucus membranes or skin necrosis: No Has patient had a PCN reaction that required hospitalization No Has patient had a PCN reaction occurring within the last 10 years: No If all of the above answers are "NO", then may proceed with Cephalosporin use.    Nitrofurantoin Itching    Metabolic Disorder Labs: No results found for: "HGBA1C", "MPG" No results found for: "PROLACTIN" No results found for: "CHOL", "TRIG", "HDL", "CHOLHDL", "VLDL", "LDLCALC" Lab Results  Component Value Date   TSH 0.635 07/31/2016    Therapeutic Level Labs: No results found for: "LITHIUM" No results found for: "VALPROATE" No results found for: "CBMZ"  Current Medications: Current Outpatient Medications  Medication Sig Dispense Refill   ALPRAZolam (XANAX) 1 MG tablet Take 1 tablet (1 mg total) by mouth 3 (three) times daily. 90 tablet 2   amLODipine (NORVASC) 5 MG tablet Take 5 mg by mouth daily.     buPROPion (WELLBUTRIN XL) 150 MG 24 hr tablet Take 1 tablet (150 mg total) by mouth every morning. 30 tablet 2   omeprazole (PRILOSEC) 20 MG capsule Take 1 capsule (20 mg total) by mouth daily. 30 capsule 3   sertraline (ZOLOFT) 50 MG tablet Take 1 tablet (50 mg total) by mouth daily. 30 tablet 2   No current facility-administered medications for this visit.     Musculoskeletal: Strength & Muscle Tone: within normal limits Gait & Station: normal Patient leans: N/A  Psychiatric Specialty Exam: Review of Systems  Constitutional:  Positive for fatigue.  Psychiatric/Behavioral:  Positive for dysphoric mood.   All other systems reviewed and are negative.   There were no vitals taken for this  visit.There is no height or weight on file to calculate BMI.  General Appearance: Casual  Eye Contact:  Good  Speech:  Clear and Coherent  Volume:  Normal  Mood:  anxious  Affect:  Congruent  Thought Process:  Goal Directed  Orientation:  Full (Time, Place, and Person)  Thought Content: WDL   Suicidal Thoughts:  No  Homicidal Thoughts:  No  Memory:  Immediate;   Good Recent;   Good Remote;   Good  Judgement:  Good  Insight:  Fair  Psychomotor Activity:  Decreased  Concentration:  Concentration: Good and Attention Span: Good  Recall:  Good  Fund of Knowledge: Good  Language: Good  Akathisia:  No  Handed:  Right  AIMS (if indicated): not done  Assets:  Communication Skills Desire for Improvement Physical Health Resilience Social Support Talents/Skills  ADL's:  Intact  Cognition: WNL  Sleep:  Good   Screenings: GAD-7    Flowsheet Row Counselor from 10/09/2021 in [place] Office Visit from 04/19/2016 in [place] Visit from 02/28/2016 in [place]  Total GAD-7 Score 18 20 19       PHQ2-9    Flowsheet Row Video Visit from 06/01/2022 in [facility] Video Visit from 05/04/2022 in [facility] [facility] [facility] Video Visit from 02/23/2022 in [facility] Video Visit from 01/04/2022 in [facility] [place] [facility] Video Visit from 11/07/2021 in [facility] [facility]  PHQ-2 Total Score 1 2 0 2 1  PHQ-9 Total Score -- 5 -- 5 --      Flowsheet Row Video Visit from 06/01/2022 in [place] [facility] Video Visit from 05/04/2022 in [place] [facility] Video Visit from 02/23/2022 in [place] [facility]  C-SSRS RISK CATEGORY No  Risk No Risk No Risk        Assessment and Plan: Patient is a 26 year old female with a history of anxiety and depression.  She still does not have that much energy so we will again try to add Wellbutrin XL 150 mg to the Zoloft 50 mg daily for depression.  She will continue Xanax 1 mg 2-3 times as needed for anxiety.  She will return to see me in 6 weeks  Collaboration of Care: Collaboration of Care: Primary Care Provider AEB notes will be shared with PCP at patient's request  Patient/Guardian was advised Release of Information must be obtained prior to any record release in order to collaborate their care with an outside provider. Patient/Guardian was advised if they have not already done so to contact the registration department to sign all necessary forms in order for [place] to release information regarding their care.   Consent: Patient/Guardian gives verbal consent for treatment and assignment of benefits for services provided during this visit. Patient/Guardian expressed understanding and agreed to proceed.    Levonne Spiller, MD 06/01/2022, 9:41 AM

## 2022-06-26 DIAGNOSIS — I10 Essential (primary) hypertension: Secondary | ICD-10-CM | POA: Diagnosis not present

## 2022-07-04 ENCOUNTER — Telehealth: Payer: BC Managed Care – PPO | Admitting: Physician Assistant

## 2022-07-04 ENCOUNTER — Telehealth: Payer: BC Managed Care – PPO

## 2022-07-04 ENCOUNTER — Encounter: Payer: Self-pay | Admitting: Physician Assistant

## 2022-07-04 DIAGNOSIS — B9689 Other specified bacterial agents as the cause of diseases classified elsewhere: Secondary | ICD-10-CM

## 2022-07-04 DIAGNOSIS — R051 Acute cough: Secondary | ICD-10-CM | POA: Diagnosis not present

## 2022-07-04 DIAGNOSIS — R42 Dizziness and giddiness: Secondary | ICD-10-CM | POA: Diagnosis not present

## 2022-07-04 DIAGNOSIS — J069 Acute upper respiratory infection, unspecified: Secondary | ICD-10-CM

## 2022-07-04 MED ORDER — AZITHROMYCIN 250 MG PO TABS: ORAL_TABLET | ORAL | 0 refills | Status: AC

## 2022-07-04 MED ORDER — FLUTICASONE PROPIONATE 50 MCG/ACT NA SUSP: 2.0000 | Freq: Every day | NASAL | 0 refills | Status: DC

## 2022-07-04 MED ORDER — PREDNISONE 20 MG PO TABS: 20.0000 mg | ORAL_TABLET | Freq: Every day | ORAL | 0 refills | Status: DC

## 2022-07-04 MED ORDER — BENZONATATE 100 MG PO CAPS: 100.0000 mg | ORAL_CAPSULE | Freq: Three times a day (TID) | ORAL | 0 refills | Status: DC | PRN

## 2022-07-04 NOTE — Patient Instructions (Signed)
[name], thank you for joining Margaretann Loveless, PA-C for today's virtual visit.  While this provider is not your primary care provider (PCP), if your PCP is located in our provider database this encounter information will be shared with them immediately following your visit.   A [facility] MyChart account gives you access to today's visit and all your visits, tests, and labs performed at [facility] " click here if you don't have a [facility] MyChart account or go to [email or web address]  Consent: (Patient) Kayla Sanchez provided verbal consent for this virtual visit at the beginning of the encounter.  Current Medications:  Current Outpatient Medications:    azithromycin (ZITHROMAX) 250 MG tablet, Take 2 tablets on day 1, then 1 tablet daily on days 2 through 5, Disp: 6 tablet, Rfl: 0   benzonatate (TESSALON) 100 MG capsule, Take 1 capsule (100 mg total) by mouth 3 (three) times daily as needed., Disp: 30 capsule, Rfl: 0   fluticasone (FLONASE) 50 MCG/ACT nasal spray, Place 2 sprays into both nostrils daily., Disp: 16 g, Rfl: 0   predniSONE (DELTASONE) 20 MG tablet, Take 1 tablet (20 mg total) by mouth daily with breakfast., Disp: 5 tablet, Rfl: 0   ALPRAZolam (XANAX) 1 MG tablet, Take 1 tablet (1 mg total) by mouth 3 (three) times daily., Disp: 90 tablet, Rfl: 2   amLODipine (NORVASC) 5 MG tablet, Take 5 mg by mouth daily., Disp: , Rfl:    buPROPion (WELLBUTRIN XL) 150 MG 24 hr tablet, Take 1 tablet (150 mg total) by mouth every morning., Disp: 30 tablet, Rfl: 2   omeprazole (PRILOSEC) 20 MG capsule, Take 1 capsule (20 mg total) by mouth daily., Disp: 30 capsule, Rfl: 3   sertraline (ZOLOFT) 50 MG tablet, Take 1 tablet (50 mg total) by mouth daily., Disp: 30 tablet, Rfl: 2   Medications ordered in this encounter:  Meds ordered this encounter  Medications   predniSONE (DELTASONE) 20 MG tablet    Sig: Take 1 tablet (20 mg total) by mouth daily with  breakfast.    Dispense:  5 tablet    Refill:  0    Order Specific Question:   Supervising Provider    Answer:   Merrilee Jansky [[id number]]   fluticasone (FLONASE) 50 MCG/ACT nasal spray    Sig: Place 2 sprays into both nostrils daily.    Dispense:  16 g    Refill:  0    Order Specific Question:   Supervising Provider    Answer:   Merrilee Jansky [[id number]]   azithromycin (ZITHROMAX) 250 MG tablet    Sig: Take 2 tablets on day 1, then 1 tablet daily on days 2 through 5    Dispense:  6 tablet    Refill:  0    Order Specific Question:   Supervising Provider    Answer:   Merrilee Jansky [[id number]]   benzonatate (TESSALON) 100 MG capsule    Sig: Take 1 capsule (100 mg total) by mouth 3 (three) times daily as needed.    Dispense:  30 capsule    Refill:  0    Order Specific Question:   Supervising Provider    Answer:   Merrilee Jansky [id number]     *If you need refills on other medications prior to your next appointment, please contact your pharmacy*  Follow-Up: Call back or seek an in-person evaluation if the symptoms worsen or if the condition fails to improve as anticipated.  [place] [phone]  Other Instructions  Vertigo Vertigo is the feeling that you or your surroundings are moving when they are not. This feeling can come and go at any time. Vertigo often goes away on its own. Vertigo can be dangerous if it occurs while you are doing something that could endanger yourself or others, such as driving or operating machinery. Your health care provider will do tests to try to determine the cause of your vertigo. Tests will also help your health care provider decide how best to treat your condition. Follow these instructions at home: Eating and drinking     Dehydration can make vertigo worse. Drink enough fluid to keep your urine pale yellow. Do not drink alcohol. Activity Return to your normal activities as told by your health care provider. Ask your  health care provider what activities are safe for you. In the morning, first sit up on the side of the bed. When you feel okay, stand slowly while you hold onto something until you know that your balance is fine. Move slowly. Avoid sudden body or head movements or certain positions, as told by your health care provider. If you have trouble walking or keeping your balance, try using a cane for stability. If you feel dizzy or unstable, sit down right away. Avoid doing any tasks that would cause danger to you or others if vertigo occurs. Avoid bending down if you feel dizzy. Place items in your home so that they are easy for you to reach without bending or leaning over. Do not drive or use machinery if you feel dizzy. General instructions Take over-the-counter and prescription medicines only as told by your health care provider. Keep all follow-up visits. This is important. Contact a health care provider if: Your medicines do not relieve your vertigo or they make it worse. Your condition gets worse or you develop new symptoms. You have a fever. You develop nausea or vomiting, or if nausea gets worse. Your family or friends notice any behavioral changes. You have numbness or a prickling and tingling sensation in part of your body. Get help right away if you: Are always dizzy or you faint. Develop severe headaches. Develop a stiff neck. Develop sensitivity to light. Have difficulty moving or speaking. Have weakness in your hands, arms, or legs. Have changes in your hearing or vision. These symptoms may represent a serious problem that is an emergency. Do not wait to see if the symptoms will go away. Get medical help right away. Call your local emergency services (911 in the [place] Do not drive yourself to the hospital. Summary Vertigo is the feeling that you or your surroundings are moving when they are not. Your health care provider will do tests to try to determine the cause of your  vertigo. Follow instructions for home care. You may be told to avoid certain tasks, positions, or movements. Contact a health care provider if your medicines do not relieve your symptoms, or if you have a fever, nausea, vomiting, or changes in behavior. Get help right away if you have severe headaches or difficulty speaking, or you develop hearing or vision problems. This information is not intended to replace advice given to you by your health care provider. Make sure you discuss any questions you have with your health care provider. Document Revised: 06/15/2020 Document Reviewed: 06/15/2020 Elsevier Patient Education  [place].    If you have been instructed to have an in-person evaluation today at a local  Urgent Care facility, please use the link below. It will take you to a list of all of our available [facility], including address, phone number and hours of operation. Please do not delay care.  [facility]  If you or a family member do not have a primary care provider, use the link below to schedule a visit and establish care. When you choose a [facility] primary care physician or advanced practice provider, you gain a long-term partner in health. Find a Primary Care Provider  Learn more about [facility]'s in-office and virtual care options: [facility] Now

## 2022-07-04 NOTE — Progress Notes (Signed)
Virtual Visit Consent   [name], you are scheduled for a virtual visit with a [facility] provider today. Just as with appointments in the office, your consent must be obtained to participate. Your consent will be active for this visit and any virtual visit you may have with one of our providers in the next 365 days. If you have a MyChart account, a copy of this consent can be sent to you electronically.  As this is a virtual visit, video technology does not allow for your provider to perform a traditional examination. This may limit your provider's ability to fully assess your condition. If your provider identifies any concerns that need to be evaluated in person or the need to arrange testing (such as labs, EKG, etc.), we will make arrangements to do so. Although advances in technology are sophisticated, we cannot ensure that it will always work on either your end or our end. If the connection with a video visit is poor, the visit may have to be switched to a telephone visit. With either a video or telephone visit, we are not always able to ensure that we have a secure connection.  By engaging in this virtual visit, you consent to the provision of healthcare and authorize for your insurance to be billed (if applicable) for the services provided during this visit. Depending on your insurance coverage, you may receive a charge related to this service.  I need to obtain your verbal consent now. Are you willing to proceed with your visit today? Kayla Sanchez has provided verbal consent on 07/04/2022 for a virtual visit (video or telephone). Margaretann Loveless, PA-C  Date: 07/04/2022 12:39 PM  Virtual Visit via Video Note   I, Margaretann Loveless, connected with  Kayla Sanchez  ([id number], 19-Apr-1996) on 07/04/22 at 12:30 PM EST by a video-enabled telemedicine application and verified that I am speaking with the correct person using two identifiers.  Location: Patient:  Virtual Visit Location Patient: Home Provider: Virtual Visit Location Provider: Home Office   I discussed the limitations of evaluation and management by telemedicine and the availability of in person appointments. The patient expressed understanding and agreed to proceed.    History of Present Illness: Kayla Sanchez is a 26 y.o. who identifies as a female who was assigned female at birth, and is being seen today for Cough and dizziness.  HPI: Cough This is a new problem. The current episode started in the past 7 days (cough and ears started 2 days ago). The problem has been gradually worsening. The problem occurs every few minutes. The cough is Productive of sputum. Associated symptoms include ear congestion (left ear), headaches (yesterday), nasal congestion and postnasal drip. Pertinent negatives include no chills, fever, myalgias, rhinorrhea, sore throat, shortness of breath, sweats or wheezing. Associated symptoms comments: Dizziness and lightheadedness, ear fullness. Nothing aggravates the symptoms. She has tried nothing for the symptoms. The treatment provided no relief. There is no history of asthma, bronchitis or pneumonia.     Problems:  Patient Active Problem List   Diagnosis Date Noted   MDD (major depressive disorder), recurrent episode, severe (HCC) 09/04/2021   Panic disorder 09/04/2021   Depression 12/08/2012   GAD (generalized anxiety disorder) 12/08/2012    Allergies:  Allergies  Allergen Reactions   Ciprofloxacin Other (See Comments)    Tendonitis    Penicillins Hives    Has patient had a PCN reaction causing immediate rash, facial/tongue/throat swelling, SOB or lightheadedness with hypotension:  No Has patient had a PCN reaction causing severe rash involving mucus membranes or skin necrosis: No Has patient had a PCN reaction that required hospitalization No Has patient had a PCN reaction occurring within the last 10 years: No If all of the above answers are  "NO", then may proceed with Cephalosporin use.    Nitrofurantoin Itching   Medications:  Current Outpatient Medications:    azithromycin (ZITHROMAX) 250 MG tablet, Take 2 tablets on day 1, then 1 tablet daily on days 2 through 5, Disp: 6 tablet, Rfl: 0   benzonatate (TESSALON) 100 MG capsule, Take 1 capsule (100 mg total) by mouth 3 (three) times daily as needed., Disp: 30 capsule, Rfl: 0   fluticasone (FLONASE) 50 MCG/ACT nasal spray, Place 2 sprays into both nostrils daily., Disp: 16 g, Rfl: 0   predniSONE (DELTASONE) 20 MG tablet, Take 1 tablet (20 mg total) by mouth daily with breakfast., Disp: 5 tablet, Rfl: 0   ALPRAZolam (XANAX) 1 MG tablet, Take 1 tablet (1 mg total) by mouth 3 (three) times daily., Disp: 90 tablet, Rfl: 2   amLODipine (NORVASC) 5 MG tablet, Take 5 mg by mouth daily., Disp: , Rfl:    buPROPion (WELLBUTRIN XL) 150 MG 24 hr tablet, Take 1 tablet (150 mg total) by mouth every morning., Disp: 30 tablet, Rfl: 2   omeprazole (PRILOSEC) 20 MG capsule, Take 1 capsule (20 mg total) by mouth daily., Disp: 30 capsule, Rfl: 3   sertraline (ZOLOFT) 50 MG tablet, Take 1 tablet (50 mg total) by mouth daily., Disp: 30 tablet, Rfl: 2   Observations/Objective: Patient is well-developed, well-nourished in no acute distress.  Resting comfortably at home.  Head is normocephalic, atraumatic.  No labored breathing.  Speech is clear and coherent with logical content.  Patient is alert and oriented at baseline.    Assessment and Plan: 1. Acute bacterial sinusitis - azithromycin (ZITHROMAX) 250 MG tablet; Take 2 tablets on day 1, then 1 tablet daily on days 2 through 5  Dispense: 6 tablet; Refill: 0  2. Vertigo - predniSONE (DELTASONE) 20 MG tablet; Take 1 tablet (20 mg total) by mouth daily with breakfast.  Dispense: 5 tablet; Refill: 0 - fluticasone (FLONASE) 50 MCG/ACT nasal spray; Place 2 sprays into both nostrils daily.  Dispense: 16 g; Refill: 0  3. Acute cough - benzonatate  (TESSALON) 100 MG capsule; Take 1 capsule (100 mg total) by mouth 3 (three) times daily as needed.  Dispense: 30 capsule; Refill: 0  - Suspect possible infection causing inflammation and ETD that is causing vertigo - Azithromycin for infection - Fluticasone and prednisone for inflammation, ETD, and vertigo - Tessalon for cough - Discussed Bonine or Dramamine OTC for dizziness - Take bonine or dramamine between doses of xanax since she is on this TID to avoid excessive drowsiness - Push fluids - Rest - Seek in person evaluation if symptoms worsen or fail to improve  Follow Up Instructions: I discussed the assessment and treatment plan with the patient. The patient was provided an opportunity to ask questions and all were answered. The patient agreed with the plan and demonstrated an understanding of the instructions.  A copy of instructions were sent to the patient via MyChart unless otherwise noted below.    The patient was advised to call back or seek an in-person evaluation if the symptoms worsen or if the condition fails to improve as anticipated.  Time:  I spent 12 minutes with the patient via telehealth technology discussing  the above problems/concerns.    Mar Daring, PA-C

## 2022-08-22 ENCOUNTER — Telehealth: Payer: BC Managed Care – PPO | Admitting: Nurse Practitioner

## 2022-08-22 DIAGNOSIS — U071 COVID-19: Secondary | ICD-10-CM | POA: Diagnosis not present

## 2022-08-22 NOTE — Patient Instructions (Signed)
Kayla Sanchez, thank you for joining Chevis Pretty, FNP for today's virtual visit.  While this provider is not your primary care provider (PCP), if your PCP is located in our provider database this encounter information will be shared with them immediately following your visit.   [facility] account gives you access to today's visit and all your visits, tests, and labs performed at [facility] " click here if you don't have a [facility] account or go to [email or web address]  Consent: (Patient) Kayla Sanchez provided verbal consent for this virtual visit at the beginning of the encounter.  Current Medications:  Current Outpatient Medications:    ALPRAZolam (XANAX) 1 MG tablet, Take 1 tablet (1 mg total) by mouth 3 (three) times daily., Disp: 90 tablet, Rfl: 2   amLODipine (NORVASC) 5 MG tablet, Take 5 mg by mouth daily., Disp: , Rfl:    benzonatate (TESSALON) 100 MG capsule, Take 1 capsule (100 mg total) by mouth 3 (three) times daily as needed., Disp: 30 capsule, Rfl: 0   buPROPion (WELLBUTRIN XL) 150 MG 24 hr tablet, Take 1 tablet (150 mg total) by mouth every morning., Disp: 30 tablet, Rfl: 2   fluticasone (FLONASE) 50 MCG/ACT nasal spray, Place 2 sprays into both nostrils daily., Disp: 16 g, Rfl: 0   omeprazole (PRILOSEC) 20 MG capsule, Take 1 capsule (20 mg total) by mouth daily., Disp: 30 capsule, Rfl: 3   predniSONE (DELTASONE) 20 MG tablet, Take 1 tablet (20 mg total) by mouth daily with breakfast., Disp: 5 tablet, Rfl: 0   sertraline (ZOLOFT) 50 MG tablet, Take 1 tablet (50 mg total) by mouth daily., Disp: 30 tablet, Rfl: 2   Medications ordered in this encounter:  No orders of the defined types were placed in this encounter.    *If you need refills on other medications prior to your next appointment, please contact your pharmacy*  Follow-Up: Call back or seek an in-person evaluation if the symptoms worsen or if the  condition fails to improve as anticipated.  [facility] [phone]  Other Instructions 1. Take meds as prescribed 2. Use a cool mist humidifier especially during the winter months and when heat has been humid. 3. Use saline nose sprays frequently 4. Saline irrigations of the nose can be very helpful if done frequently.  * 4X daily for 1 week*  * Use of a nettie pot can be helpful with this. Follow directions with this* 5. Drink plenty of fluids 6. Keep thermostat turn down low 7.For any cough or congestion- mucinex 8. For fever or aces or pains- take tylenol or ibuprofen appropriate for age and weight.  * for fevers greater than 101 orally you may alternate ibuprofen and tylenol every  3 hours.      If you have been instructed to have an in-person evaluation today at a local Urgent Care facility, please use the link below. It will take you to a list of all of our available [facility], including address, phone number and hours of operation. Please do not delay care.  [facility]  If you or a family member do not have a primary care provider, use the link below to schedule a visit and establish care. When you choose a [facility] primary care physician or advanced practice provider, you gain a long-term partner in health. Find a Primary Care Provider  Learn more about [facility]'s in-office and virtual care options: [facility] -  Get Care Now

## 2022-08-22 NOTE — Progress Notes (Signed)
Virtual Visit Consent   Kayla Sanchez, you are scheduled for a virtual visit with Mary-Margaret Hassell Done, [place], a [facility] provider, today.     Just as with appointments in the office, your consent must be obtained to participate.  Your consent will be active for this visit and any virtual visit you may have with one of our providers in the next 365 days.     If you have a MyChart account, a copy of this consent can be sent to you electronically.  All virtual visits are billed to your insurance company just like a traditional visit in the office.    As this is a virtual visit, video technology does not allow for your provider to perform a traditional examination.  This may limit your provider's ability to fully assess your condition.  If your provider identifies any concerns that need to be evaluated in person or the need to arrange testing (such as labs, EKG, etc.), we will make arrangements to do so.     Although advances in technology are sophisticated, we cannot ensure that it will always work on either your end or our end.  If the connection with a video visit is poor, the visit may have to be switched to a telephone visit.  With either a video or telephone visit, we are not always able to ensure that we have a secure connection.     I need to obtain your verbal consent now.   Are you willing to proceed with your visit today? YES   Kayla Sanchez has provided verbal consent on 08/22/2022 for a virtual visit (video or telephone).   Mary-Margaret Hassell Done, FNP   Date: 08/22/2022 2:05 PM   Virtual Visit via Video Note   I, Mary-Margaret Hassell Done, connected with Kayla Sanchez ([id number], 05-23-1996) on 08/22/22 at  2:15 PM EST by a video-enabled telemedicine application and verified that I am speaking with the correct person using two identifiers.  Location: Patient: Virtual Visit Location Patient: Home Provider: Virtual Visit Location Provider: Mobile   I discussed  the limitations of evaluation and management by telemedicine and the availability of in person appointments. The patient expressed understanding and agreed to proceed.    History of Present Illness: Kayla Sanchez is a 27 y.o. who identifies as a female who was assigned female at birth, and is being seen today for covid positive.  HPI: URI  This is a new problem. The current episode started yesterday. The problem has been waxing and waning. The maximum temperature recorded prior to her arrival was 101 - 101.9 F. The fever has been present for Less than 1 day. Associated symptoms include congestion, coughing and rhinorrhea. She has tried NSAIDs for the symptoms. The treatment provided mild relief.   Tested positive for covid yesterday Review of Systems  HENT:  Positive for congestion and rhinorrhea.   Respiratory:  Positive for cough.     Problems:  Patient Active Problem List   Diagnosis Date Noted   MDD (major depressive disorder), recurrent episode, severe ([place] [place]) 09/04/2021   Panic disorder 09/04/2021   Depression 12/08/2012   GAD (generalized anxiety disorder) 12/08/2012    Allergies:  Allergies  Allergen Reactions   Ciprofloxacin Other (See Comments)    Tendonitis    Penicillins Hives    Has patient had a PCN reaction causing immediate rash, facial/tongue/throat swelling, SOB or lightheadedness with hypotension: No Has patient had a PCN reaction causing severe rash involving mucus membranes or  skin necrosis: No Has patient had a PCN reaction that required hospitalization No Has patient had a PCN reaction occurring within the last 10 years: No If all of the above answers are "NO", then may proceed with Cephalosporin use.    Nitrofurantoin Itching   Medications:  Current Outpatient Medications:    ALPRAZolam (XANAX) 1 MG tablet, Take 1 tablet (1 mg total) by mouth 3 (three) times daily., Disp: 90 tablet, Rfl: 2   amLODipine (NORVASC) 5 MG tablet, Take 5 mg by mouth  daily., Disp: , Rfl:    benzonatate (TESSALON) 100 MG capsule, Take 1 capsule (100 mg total) by mouth 3 (three) times daily as needed., Disp: 30 capsule, Rfl: 0   buPROPion (WELLBUTRIN XL) 150 MG 24 hr tablet, Take 1 tablet (150 mg total) by mouth every morning., Disp: 30 tablet, Rfl: 2   fluticasone (FLONASE) 50 MCG/ACT nasal spray, Place 2 sprays into both nostrils daily., Disp: 16 g, Rfl: 0   omeprazole (PRILOSEC) 20 MG capsule, Take 1 capsule (20 mg total) by mouth daily., Disp: 30 capsule, Rfl: 3   predniSONE (DELTASONE) 20 MG tablet, Take 1 tablet (20 mg total) by mouth daily with breakfast., Disp: 5 tablet, Rfl: 0   sertraline (ZOLOFT) 50 MG tablet, Take 1 tablet (50 mg total) by mouth daily., Disp: 30 tablet, Rfl: 2  Observations/Objective: Patient is well-developed, well-nourished in no acute distress.  Resting comfortably  at home.  Head is normocephalic, atraumatic.  No labored breathing.  Speech is clear and coherent with logical content.  Patient is alert and oriented at baseline.  No cough during visit  Assessment and Plan:  Kayla Sanchez in today with chief complaint of Covid Positive   1. Positive self-administered antigen test for COVID-19 1. Take meds as prescribed 2. Use a cool mist humidifier especially during the winter months and when heat has been humid. 3. Use saline nose sprays frequently 4. Saline irrigations of the nose can be very helpful if done frequently.  * 4X daily for 1 week*  * Use of a nettie pot can be helpful with this. Follow directions with this* 5. Drink plenty of fluids 6. Keep thermostat turn down low 7.For any cough or congestion- mucinex OTC  8. For fever or aces or pains- take tylenol or ibuprofen appropriate for age and weight.  * for fevers greater than 101 orally you may alternate ibuprofen and tylenol every  3 hours.   Antiviral not needed    Follow Up Instructions: I discussed the assessment and treatment plan with the  patient. The patient was provided an opportunity to ask questions and all were answered. The patient agreed with the plan and demonstrated an understanding of the instructions.  A copy of instructions were sent to the patient via MyChart.  The patient was advised to call back or seek an in-person evaluation if the symptoms worsen or if the condition fails to improve as anticipated.  Time:  I spent 6 minutes with the patient via telehealth technology discussing the above problems/concerns.    Mary-Margaret Hassell Done, FNP

## 2022-08-27 DIAGNOSIS — K219 Gastro-esophageal reflux disease without esophagitis: Secondary | ICD-10-CM | POA: Diagnosis not present

## 2022-08-27 DIAGNOSIS — R0789 Other chest pain: Secondary | ICD-10-CM | POA: Diagnosis not present

## 2022-09-05 ENCOUNTER — Other Ambulatory Visit (HOSPITAL_COMMUNITY): Payer: Self-pay | Admitting: Psychiatry

## 2022-09-05 ENCOUNTER — Telehealth (HOSPITAL_COMMUNITY): Payer: Self-pay | Admitting: *Deleted

## 2022-09-05 MED ORDER — SERTRALINE HCL 50 MG PO TABS: 50.0000 mg | ORAL_TABLET | Freq: Every day | ORAL | 2 refills | Status: DC

## 2022-09-05 MED ORDER — ALPRAZOLAM 1 MG PO TABS: 1.0000 mg | ORAL_TABLET | Freq: Three times a day (TID) | ORAL | 2 refills | Status: DC

## 2022-09-05 MED ORDER — BUPROPION HCL ER (XL) 150 MG PO TB24: 150.0000 mg | ORAL_TABLET | ORAL | 2 refills | Status: DC

## 2022-09-05 NOTE — Telephone Encounter (Signed)
Patient called stating she would like refills for her Wellbutrin XL, Xanax and Zoloft. Patient is scheduled for 09/26/2022.

## 2022-09-06 NOTE — Telephone Encounter (Signed)
LMOM

## 2022-09-26 ENCOUNTER — Encounter (HOSPITAL_COMMUNITY): Payer: Self-pay | Admitting: Psychiatry

## 2022-09-26 ENCOUNTER — Telehealth (INDEPENDENT_AMBULATORY_CARE_PROVIDER_SITE_OTHER): Payer: BC Managed Care – PPO | Admitting: Psychiatry

## 2022-09-26 DIAGNOSIS — F324 Major depressive disorder, single episode, in partial remission: Secondary | ICD-10-CM | POA: Diagnosis not present

## 2022-09-26 DIAGNOSIS — F411 Generalized anxiety disorder: Secondary | ICD-10-CM | POA: Diagnosis not present

## 2022-09-26 MED ORDER — ALPRAZOLAM 1 MG PO TABS: 1.0000 mg | ORAL_TABLET | Freq: Three times a day (TID) | ORAL | 2 refills | Status: DC

## 2022-09-26 MED ORDER — BUPROPION HCL ER (XL) 150 MG PO TB24: 150.0000 mg | ORAL_TABLET | ORAL | 2 refills | Status: DC

## 2022-09-26 NOTE — Progress Notes (Signed)
Virtual Visit via Video Note  I connected with Kayla Sanchez on 09/26/22 at  9:00 AM EST by a video enabled telemedicine application and verified that I am speaking with the correct person using two identifiers.  Location: Patient: home Provider: office   I discussed the limitations of evaluation and management by telemedicine and the availability of in person appointments. The patient expressed understanding and agreed to proceed.      I discussed the assessment and treatment plan with the patient. The patient was provided an opportunity to ask questions and all were answered. The patient agreed with the plan and demonstrated an understanding of the instructions.   The patient was advised to call back or seek an in-person evaluation if the symptoms worsen or if the condition fails to improve as anticipated.  I provided 15 minutes of non-face-to-face time during this encounter.   Levonne Spiller, MD  [facility] MD/PA/NP OP Progress Note  09/26/2022 9:20 AM Kayla Sanchez  MRN:  [id number]  Chief Complaint:  Chief Complaint  Patient presents with   Anxiety   Depression   Follow-up   HPI: This patient is a 27 year old single white female who lives with her 2 children in [place].  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  The patient returns for follow-up after about 3-1/2 months.  She still complaining of the same issues-low energy depressed mood not feeling like doing anything very stressed from her work.  We again discussed switching to Wellbutrin because it would help her energy but she never picked it up last time or the time before.  She is worried about the interactions with Zoloft even though I assured her that this would be fine.  She seems uncomfortable with it so I suggested she start the Wellbutrin and taper off the Zoloft and she claims that she well.  The Xanax continues to help her anxiety.  She still has bad health habits like not exercising and  smoking at least a pack a day.  She spends a lot of time in bed when she is not sleeping.  She denies thoughts of self-harm or suicide. Visit Diagnosis:    ICD-10-CM   1. Major depressive disorder with single episode, in partial remission ([place])  F32.4     2. GAD (generalized anxiety disorder)  F41.1       Past Psychiatric History: none  Past Medical History:  Past Medical History:  Diagnosis Date   Anxiety    Depression    GERD (gastroesophageal reflux disease)     Past Surgical History:  Procedure Laterality Date   WISDOM TOOTH EXTRACTION      Family Psychiatric History: See below  Family History:  Family History  Problem Relation Age of Onset   Depression Mother    Anxiety disorder Mother    Drug abuse Father    Anxiety disorder Maternal Aunt    Alcohol abuse Maternal Grandfather    Anxiety disorder Maternal Grandfather     Social History:  Social History   Socioeconomic History   Marital status: Single    Spouse name: Not on file   Number of children: Not on file   Years of education: Not on file   Highest education level: Not on file  Occupational History   Not on file  Tobacco Use   Smoking status: Every Day    Packs/day: 1.00    Years: 4.00    Total pack years: 4.00    Types:  Cigarettes   Smokeless tobacco: Never  Vaping Use   Vaping Use: Former  Substance and Sexual Activity   Alcohol use: Yes    Comment: 3 beers 1-2 x per week   Drug use: No    Comment: 05-21-2016 per pt 2 yrs ago   Sexual activity: Not Currently    Birth control/protection: None  Other Topics Concern   Not on file  Social History Narrative   Not on file   Social Determinants of Health   Financial Resource Strain: Not on file  Food Insecurity: Not on file  Transportation Needs: Not on file  Physical Activity: Not on file  Stress: Not on file  Social Connections: Not on file    Allergies:  Allergies  Allergen Reactions   Ciprofloxacin Other (See Comments)     Tendonitis    Penicillins Hives    Has patient had a PCN reaction causing immediate rash, facial/tongue/throat swelling, SOB or lightheadedness with hypotension: No Has patient had a PCN reaction causing severe rash involving mucus membranes or skin necrosis: No Has patient had a PCN reaction that required hospitalization No Has patient had a PCN reaction occurring within the last 10 years: No If all of the above answers are "NO", then may proceed with Cephalosporin use.    Nitrofurantoin Itching    Metabolic Disorder Labs: No results found for: "HGBA1C", "MPG" No results found for: "PROLACTIN" No results found for: "CHOL", "TRIG", "HDL", "CHOLHDL", "VLDL", "LDLCALC" Lab Results  Component Value Date   TSH 0.635 07/31/2016    Therapeutic Level Labs: No results found for: "LITHIUM" No results found for: "VALPROATE" No results found for: "CBMZ"  Current Medications: Current Outpatient Medications  Medication Sig Dispense Refill   ALPRAZolam (XANAX) 1 MG tablet Take 1 tablet (1 mg total) by mouth 3 (three) times daily. 90 tablet 2   amLODipine (NORVASC) 5 MG tablet Take 5 mg by mouth daily.     buPROPion (WELLBUTRIN XL) 150 MG 24 hr tablet Take 1 tablet (150 mg total) by mouth every morning. 30 tablet 2   omeprazole (PRILOSEC) 20 MG capsule Take 1 capsule (20 mg total) by mouth daily. 30 capsule 3   sertraline (ZOLOFT) 50 MG tablet Take 1 tablet (50 mg total) by mouth daily. 30 tablet 2   No current facility-administered medications for this visit.     Musculoskeletal: Strength & Muscle Tone: within normal limits Gait & Station: normal Patient leans: N/A  Psychiatric Specialty Exam: Review of Systems  Constitutional:  Positive for fatigue.  Psychiatric/Behavioral:  Positive for dysphoric mood. The patient is nervous/anxious.   All other systems reviewed and are negative.   There were no vitals taken for this visit.There is no height or weight on file to calculate BMI.   General Appearance: Casual and Disheveled  Eye Contact:  Good  Speech:  Clear and Coherent  Volume:  Normal  Mood:  Anxious and Dysphoric  Affect:  Flat  Thought Process:  Goal Directed  Orientation:  Full (Time, Place, and Person)  Thought Content: Rumination   Suicidal Thoughts:  No  Homicidal Thoughts:  No  Memory:  Immediate;   Good Recent;   Good Remote;   Good  Judgement:  Good  Insight:  Fair  Psychomotor Activity:  Decreased  Concentration:  Concentration: Good and Attention Span: Good  Recall:  Good  Fund of Knowledge: Good  Language: Good  Akathisia:  No  Handed:  Right  AIMS (if indicated): not done  Assets:  Communication Skills Desire for Improvement Physical Health Resilience Social Support Talents/Skills  ADL's:  Intact  Cognition: WNL  Sleep:  Good   Screenings: GAD-7    Flowsheet Row Counselor from 10/09/2021 in [place] at [place] Visit from 04/19/2016 in [place] Office Visit from 02/28/2016 in [facility]  Total GAD-7 Score '18 20 19      '$ PHQ2-9    Flowsheet Row Video Visit from 09/26/2022 in [place] at [place] Video Visit from 06/01/2022 in [place] at [place] Video Visit from 05/04/2022 in [place] at [place] Video Visit from 02/23/2022 in [place] at [place] Video Visit from 01/04/2022 in [place] [facility] Total Score '5 1 2 '$ 0 2  PHQ-9 Total Score 11 -- 5 -- 5      Flowsheet Row Video Visit from 09/26/2022 in [place] at [place] Video Visit from 06/01/2022 in [place] at [place] Video Visit from 05/04/2022 in [place] at [place] No Risk  No Risk No Risk        Assessment and Plan: This patient is a 27 year old female with a history anxiety depression which seems to be something like chronic fatigue.  She claims she is always tired.  Since she is worried about combining Zoloft and Wellbutrin we will cut Zoloft down to 25 mg for 1 week while starting the Wellbutrin XL 150 mg daily.  She will continue Xanax 1 mg 2-3 times daily as needed for anxiety.  She will return to see me in 6 weeks  Collaboration of Care: Collaboration of Care: Primary Care Provider AEB notes will be shared with PCP at patient's request  Patient/Guardian was advised Release of Information must be obtained prior to any record release in order to collaborate their care with an outside provider. Patient/Guardian was advised if they have not already done so to contact the registration department to sign all necessary forms in order for [place] to release information regarding their care.   Consent: Patient/Guardian gives verbal consent for treatment and assignment of benefits for services provided during this visit. Patient/Guardian expressed understanding and agreed to proceed.    Levonne Spiller, MD 09/26/2022, 9:20 AM

## 2022-09-29 DIAGNOSIS — M79672 Pain in left foot: Secondary | ICD-10-CM | POA: Diagnosis not present

## 2022-09-29 DIAGNOSIS — S40011A Contusion of right shoulder, initial encounter: Secondary | ICD-10-CM | POA: Diagnosis not present

## 2022-09-29 DIAGNOSIS — S92902A Unspecified fracture of left foot, initial encounter for closed fracture: Secondary | ICD-10-CM | POA: Diagnosis not present

## 2022-09-29 DIAGNOSIS — M25511 Pain in right shoulder: Secondary | ICD-10-CM | POA: Diagnosis not present

## 2022-10-04 ENCOUNTER — Ambulatory Visit (INDEPENDENT_AMBULATORY_CARE_PROVIDER_SITE_OTHER): Payer: BC Managed Care – PPO

## 2022-10-04 ENCOUNTER — Encounter: Payer: Self-pay | Admitting: Orthopaedic Surgery

## 2022-10-04 ENCOUNTER — Ambulatory Visit (INDEPENDENT_AMBULATORY_CARE_PROVIDER_SITE_OTHER): Payer: BC Managed Care – PPO | Admitting: Orthopaedic Surgery

## 2022-10-04 VITALS — Ht 62.0 in | Wt 200.0 lb

## 2022-10-04 DIAGNOSIS — M79672 Pain in left foot: Secondary | ICD-10-CM

## 2022-10-04 NOTE — Progress Notes (Signed)
Office Visit Note   Patient: Kayla Sanchez           Date of Birth: [birth year]           MRN: [id number] Visit Date: 10/04/2022              Requested by: No referring provider defined for this encounter. PCP: Pcp, No   Assessment & Plan: Visit Diagnoses:  1. Pain in left foot     Plan: Patient has lateral ankle sprain she can continue her cam boot short-term can work her way back into regular shoe work on gentle range of motion.  She sprained her ankle in the past and is familiar with the exercise program to get her motion back.  She will follow-up with me after 6 weeks if she still having problems.  Follow-Up Instructions: Return if symptoms worsen or fail to improve.   Orders:  Orders Placed This Encounter  Procedures   XR Foot Complete Left   No orders of the defined types were placed in this encounter.     Procedures: No procedures performed   Clinical Data: No additional findings.   Subjective: Chief Complaint  Patient presents with   Left Foot - Pain    Fall 09/29/2022    HPI patient was seen in urgent care after falling down steps injuring her left foot.  Past history playing soccer she had rolled her ankle in the past when she played in high school.  She works from home.  She has had swelling of her foot tenderness and some pain when she stands without her cam boot.  Review of Systems noncontributory.   Objective: Vital Signs: Ht '5\' 2"'$  (1.575 m)   Wt 200 lb (90.7 kg)   BMI 36.58 kg/m   Physical Exam Constitutional:      Appearance: She is well-developed.  HENT:     Head: Normocephalic.     Right Ear: External ear normal.     Left Ear: External ear normal. There is no impacted cerumen.  Eyes:     Pupils: Pupils are equal, round, and reactive to light.  Neck:     Thyroid: No thyromegaly.     Trachea: No tracheal deviation.  Cardiovascular:     Rate and Rhythm: Normal rate.  Pulmonary:     Effort: Pulmonary effort is normal.   Abdominal:     Palpations: Abdomen is soft.  Musculoskeletal:     Cervical back: No rigidity.  Skin:    General: Skin is warm and dry.  Neurological:     Mental Status: She is alert and oriented to person, place, and time.  Psychiatric:        Behavior: Behavior normal.     Ortho Exam mild swelling ankle and foot tenderness over the internal lateral ankle joint anterior talar fib trace anterior drawer calcaneofibular is nontender tib-fib joint is normal.  Peroneals base of fifth are normal no midfoot forefoot problems.  Specialty Comments:  No specialty comments available.  Imaging: XR Foot Complete Left  Result Date: 10/04/2022 Three-view x-rays left foot obtained and reviewed.  Small spur plantar fascia origin.  Midfoot forefoot negative for fracture. Impression: Left foot radiographs negative for acute injury.    PMFS History: Patient Active Problem List   Diagnosis Date Noted   MDD (major depressive disorder), recurrent episode, severe ([place]) 09/04/2021   Panic disorder 09/04/2021   Depression 12/08/2012   GAD (generalized anxiety disorder) 12/08/2012   Past Medical History:  Diagnosis Date   Anxiety    Depression    GERD (gastroesophageal reflux disease)     Family History  Problem Relation Age of Onset   Depression Mother    Anxiety disorder Mother    Drug abuse Father    Anxiety disorder Maternal Aunt    Alcohol abuse Maternal Grandfather    Anxiety disorder Maternal Grandfather     Past Surgical History:  Procedure Laterality Date   WISDOM TOOTH EXTRACTION     Social History   Occupational History   Not on file  Tobacco Use   Smoking status: Every Day    Packs/day: 1.00    Years: 4.00    Total pack years: 4.00    Types: Cigarettes   Smokeless tobacco: Never  Vaping Use   Vaping Use: Former  Substance and Sexual Activity   Alcohol use: Yes    Comment: 3 beers 1-2 x per week   Drug use: No    Comment: 05-21-2016 per pt 2 yrs ago   Sexual  activity: Not Currently    Birth control/protection: None

## 2022-12-12 ENCOUNTER — Telehealth (INDEPENDENT_AMBULATORY_CARE_PROVIDER_SITE_OTHER): Payer: BC Managed Care – PPO | Admitting: Psychiatry

## 2022-12-12 ENCOUNTER — Encounter (HOSPITAL_COMMUNITY): Payer: Self-pay | Admitting: Psychiatry

## 2022-12-12 DIAGNOSIS — F324 Major depressive disorder, single episode, in partial remission: Secondary | ICD-10-CM

## 2022-12-12 DIAGNOSIS — F411 Generalized anxiety disorder: Secondary | ICD-10-CM

## 2022-12-12 MED ORDER — SERTRALINE HCL 50 MG PO TABS: 50.0000 mg | ORAL_TABLET | Freq: Every day | ORAL | 2 refills | Status: DC

## 2022-12-12 MED ORDER — ALPRAZOLAM 1 MG PO TABS: 1.0000 mg | ORAL_TABLET | Freq: Three times a day (TID) | ORAL | 2 refills | Status: DC

## 2022-12-12 NOTE — Progress Notes (Signed)
Virtual Visit via Video Note  I connected with Kayla Sanchez on 12/12/22 at  1:00 PM EDT by a video enabled telemedicine application and verified that I am speaking with the correct person using two identifiers.  Location: Patient: home Provider: office   I discussed the limitations of evaluation and management by telemedicine and the availability of in person appointments. The patient expressed understanding and agreed to proceed.     I discussed the assessment and treatment plan with the patient. The patient was provided an opportunity to ask questions and all were answered. The patient agreed with the plan and demonstrated an understanding of the instructions.   The patient was advised to call back or seek an in-person evaluation if the symptoms worsen or if the condition fails to improve as anticipated.  I provided 15 minutes of non-face-to-face time during this encounter.   [name], MD  [facility] MD/PA/NP OP Progress Note  12/12/2022 1:34 PM Kayla Sanchez  MRN:  [id number]  Chief Complaint:  Chief Complaint  Patient presents with   Depression   Anxiety   Follow-up   HPI: This patient is a 27 year old single white female who lives with her 2 children in the evening.  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  The patient returns for follow-up after 3 months regarding her depression and anxiety.  She states that she is not quite so depressed but continues to be very anxious.  She states her job causes her a lot of stress.  In the evenings she is not doing much and staying up too late not getting enough sleep.  She never did start the Wellbutrin but continues on Zoloft 50 mg.  She tends to obsess about her health and worried that she is going to die of a stroke or heart attack despite being too young for these things and not having too many risk factors.  She does smoke 1 pack a day and I urged her to put her worries into ways to try to quit  smoking.  At least asked her to try to cut down the cigarettes and to start walking every day.  She does state that the Xanax continues to help her anxiety.  At this point she does not feel like she has time to engage in therapy.  She denies any thoughts of self-harm or suicide Visit Diagnosis:    ICD-10-CM   1. Major depressive disorder with single episode, in partial remission ([facility])  F32.4     2. GAD (generalized anxiety disorder)  F41.1       Past Psychiatric History: none  Past Medical History:  Past Medical History:  Diagnosis Date   Anxiety    Depression    GERD (gastroesophageal reflux disease)     Past Surgical History:  Procedure Laterality Date   WISDOM TOOTH EXTRACTION      Family Psychiatric History: See below  Family History:  Family History  Problem Relation Age of Onset   Depression Mother    Anxiety disorder Mother    Drug abuse Father    Anxiety disorder Maternal Aunt    Alcohol abuse Maternal Grandfather    Anxiety disorder Maternal Grandfather     Social History:  Social History   Socioeconomic History   Marital status: Single    Spouse name: Not on file   Number of children: Not on file   Years of education: Not on file   Highest education level: Not on  file  Occupational History   Not on file  Tobacco Use   Smoking status: Every Day    Packs/day: 1.00    Years: 4.00    Additional pack years: 0.00    Total pack years: 4.00    Types: Cigarettes   Smokeless tobacco: Never  Vaping Use   Vaping Use: Former  Substance and Sexual Activity   Alcohol use: Yes    Comment: 3 beers 1-2 x per week   Drug use: No    Comment: 05-21-2016 per pt 2 yrs ago   Sexual activity: Not Currently    Birth control/protection: None  Other Topics Concern   Not on file  Social History Narrative   Not on file   Social Determinants of Health   Financial Resource Strain: Not on file  Food Insecurity: Not on file  Transportation Needs: Not on file   Physical Activity: Not on file  Stress: Not on file  Social Connections: Not on file    Allergies:  Allergies  Allergen Reactions   Ciprofloxacin Other (See Comments)    Tendonitis    Lisinopril Other (See Comments)    Hand numbness   Penicillins Hives    Has patient had a PCN reaction causing immediate rash, facial/tongue/throat swelling, SOB or lightheadedness with hypotension: No Has patient had a PCN reaction causing severe rash involving mucus membranes or skin necrosis: No Has patient had a PCN reaction that required hospitalization No Has patient had a PCN reaction occurring within the last 10 years: No If all of the above answers are "NO", then may proceed with Cephalosporin use.    Nitrofurantoin Itching    Metabolic Disorder Labs: No results found for: "HGBA1C", "MPG" No results found for: "PROLACTIN" No results found for: "CHOL", "TRIG", "HDL", "CHOLHDL", "VLDL", "LDLCALC" Lab Results  Component Value Date   TSH 0.635 07/31/2016    Therapeutic Level Labs: No results found for: "LITHIUM" No results found for: "VALPROATE" No results found for: "CBMZ"  Current Medications: Current Outpatient Medications  Medication Sig Dispense Refill   ALPRAZolam (XANAX) 1 MG tablet Take 1 tablet (1 mg total) by mouth 3 (three) times daily. 90 tablet 2   amLODipine (NORVASC) 5 MG tablet Take 5 mg by mouth daily.     omeprazole (PRILOSEC) 20 MG capsule Take 1 capsule (20 mg total) by mouth daily. 30 capsule 3   sertraline (ZOLOFT) 50 MG tablet Take 1 tablet (50 mg total) by mouth daily. 30 tablet 2   No current facility-administered medications for this visit.     Musculoskeletal: Strength & Muscle Tone: within normal limits Gait & Station: normal Patient leans: N/A  Psychiatric Specialty Exam: Review of Systems  Psychiatric/Behavioral:  The patient is nervous/anxious.   All other systems reviewed and are negative.   There were no vitals taken for this visit.There  is no height or weight on file to calculate BMI.  General Appearance: Casual and Fairly Groomed  Eye Contact:  Good  Speech:  Clear and Coherent  Volume:  Normal  Mood:  Anxious  Affect:  Congruent  Thought Process:  Goal Directed  Orientation:  Full (Time, Place, and Person)  Thought Content: Obsessions and Rumination   Suicidal Thoughts:  No  Homicidal Thoughts:  No  Memory:  Immediate;   Good Recent;   Good Remote;   Good  Judgement:  Good  Insight:  Fair  Psychomotor Activity:  Decreased  Concentration:  Concentration: Good and Attention Span: Good  Recall:  Good  Fund of Knowledge: Good  Language: Good  Akathisia:  No  Handed:  Right  AIMS (if indicated): not done  Assets:  Communication Skills Desire for Improvement Physical Health Resilience Social Support Talents/Skills  ADL's:  Intact  Cognition: WNL  Sleep:  Fair   Screenings: GAD-7    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 in [facility] [place] Office Visit from 04/19/2016 in [facility] Office Visit from 02/28/2016 in [facility]  Total GAD-7 Score 18 20 19       PHQ2-9    Flowsheet Row Video Visit from 09/26/2022 in [facility] at [place] Video Visit from 06/01/2022 in [facility] at [place] Video Visit from 05/04/2022 in [facility] at [place] Video Visit from 02/23/2022 in [facility] at [place] Video Visit from 01/04/2022 in [facility] Total Score 5 1 2  0 2  PHQ-9 Total Score 11 -- 5 -- 5      Flowsheet Row Video Visit from 09/26/2022 in [facility] at [place] Video Visit from 06/01/2022 in [facility] at [place] Video Visit from 05/04/2022 in [facility] Outpatient  [facility] at [place]  C-SSRS RISK CATEGORY No Risk No Risk No Risk        Assessment and Plan: This patient is a 27 year old female with a history of anxiety depression.  She has not willing to try increasing the medications that help anxiety such as Zoloft.  She claims she does not have time for therapy.  I urged her to at least incorporate more exercise pressure and to try to cut down the smoking.  For now she will continue Zoloft 50 mg daily as well as Xanax 1 mg 2-3 times daily as needed for anxiety.  She will return to see me in 3 months  Collaboration of Care: Collaboration of Care: Primary Care Provider AEB notes are shared with PCP on the epic system  Patient/Guardian was advised Release of Information must be obtained prior to any record release in order to collaborate their care with an outside provider. Patient/Guardian was advised if they have not already done so to contact the registration department to sign all necessary forms in order for [place] to release information regarding their care.   Consent: Patient/Guardian gives verbal consent for treatment and assignment of benefits for services provided during this visit. Patient/Guardian expressed understanding and agreed to proceed.    [name], MD 12/12/2022, 1:34 PM

## 2022-12-25 DIAGNOSIS — Z720 Tobacco use: Secondary | ICD-10-CM | POA: Diagnosis not present

## 2022-12-25 DIAGNOSIS — L680 Hirsutism: Secondary | ICD-10-CM | POA: Diagnosis not present

## 2022-12-25 DIAGNOSIS — Z113 Encounter for screening for infections with a predominantly sexual mode of transmission: Secondary | ICD-10-CM | POA: Diagnosis not present

## 2022-12-25 DIAGNOSIS — Z8742 Personal history of other diseases of the female genital tract: Secondary | ICD-10-CM | POA: Diagnosis not present

## 2022-12-25 DIAGNOSIS — Z01411 Encounter for gynecological examination (general) (routine) with abnormal findings: Secondary | ICD-10-CM | POA: Diagnosis not present

## 2022-12-25 DIAGNOSIS — L709 Acne, unspecified: Secondary | ICD-10-CM | POA: Diagnosis not present

## 2022-12-25 DIAGNOSIS — Z01419 Encounter for gynecological examination (general) (routine) without abnormal findings: Secondary | ICD-10-CM | POA: Diagnosis not present

## 2022-12-25 DIAGNOSIS — R7989 Other specified abnormal findings of blood chemistry: Secondary | ICD-10-CM | POA: Diagnosis not present

## 2023-02-05 DIAGNOSIS — Z30430 Encounter for insertion of intrauterine contraceptive device: Secondary | ICD-10-CM | POA: Diagnosis not present

## 2023-03-04 ENCOUNTER — Encounter (HOSPITAL_COMMUNITY): Payer: Self-pay | Admitting: Psychiatry

## 2023-03-04 ENCOUNTER — Telehealth (HOSPITAL_COMMUNITY): Payer: BC Managed Care – PPO | Admitting: Psychiatry

## 2023-03-04 DIAGNOSIS — F411 Generalized anxiety disorder: Secondary | ICD-10-CM

## 2023-03-04 DIAGNOSIS — F324 Major depressive disorder, single episode, in partial remission: Secondary | ICD-10-CM

## 2023-03-04 DIAGNOSIS — F410 Panic disorder [episodic paroxysmal anxiety] without agoraphobia: Secondary | ICD-10-CM | POA: Diagnosis not present

## 2023-03-04 MED ORDER — SERTRALINE HCL 50 MG PO TABS: 50.0000 mg | ORAL_TABLET | Freq: Every day | ORAL | 2 refills | Status: DC

## 2023-03-04 MED ORDER — ALPRAZOLAM 1 MG PO TABS: 1.0000 mg | ORAL_TABLET | Freq: Three times a day (TID) | ORAL | 2 refills | Status: DC

## 2023-03-04 NOTE — Progress Notes (Signed)
Virtual Visit via Video Note  I connected with Kayla Sanchez on 03/04/23 at  3:20 PM EDT by a video enabled telemedicine application and verified that I am speaking with the correct person using two identifiers.  Location: Patient: home Provider: office   I discussed the limitations of evaluation and management by telemedicine and the availability of in person appointments. The patient expressed understanding and agreed to proceed.     I discussed the assessment and treatment plan with the patient. The patient was provided an opportunity to ask questions and all were answered. The patient agreed with the plan and demonstrated an understanding of the instructions.   The patient was advised to call back or seek an in-person evaluation if the symptoms worsen or if the condition fails to improve as anticipated.  I provided 20 minutes of non-face-to-face time during this encounter.   Kayla Ruder, MD  [facility] MD/PA/NP OP Progress Note  03/04/2023 3:23 PM Kayla Sanchez  MRN:  [id number]  Chief Complaint:  Chief Complaint  Patient presents with   Depression   Anxiety   Follow-up   HPI: This patient is a 27 year old single white female who lives with her 2 children in [place].  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  The patient returns for follow-up after 3 months regarding her depression and anxiety.  Overall she states she is doing better.  She has a new boyfriend.  She is known his family for years.  She feels very comfortable with him.  She is getting out more and doing more things and her mood has improved.  She still has anxiety but the Xanax does help.  It still hard for her to go into large stores but she is trying.  She is no longer obsessed or worried about heart disease or stroke.  She denies significant depression.  She is sleeping well.  She denies any thoughts of self-harm or suicide Visit Diagnosis:    ICD-10-CM   1. Major depressive disorder  with single episode, in partial remission (HCC)  F32.4     2. GAD (generalized anxiety disorder)  F41.1     3. Panic disorder  F41.0       Past Psychiatric History: none  Past Medical History:  Past Medical History:  Diagnosis Date   Anxiety    Depression    GERD (gastroesophageal reflux disease)     Past Surgical History:  Procedure Laterality Date   WISDOM TOOTH EXTRACTION      Family Psychiatric History: See below  Family History:  Family History  Problem Relation Age of Onset   Depression Mother    Anxiety disorder Mother    Drug abuse Father    Anxiety disorder Maternal Aunt    Alcohol abuse Maternal Grandfather    Anxiety disorder Maternal Grandfather     Social History:  Social History   Socioeconomic History   Marital status: Single    Spouse name: Not on file   Number of children: Not on file   Years of education: Not on file   Highest education level: Not on file  Occupational History   Not on file  Tobacco Use   Smoking status: Every Day    Current packs/day: 1.00    Average packs/day: 1 pack/day for 4.0 years (4.0 ttl pk-yrs)    Types: Cigarettes   Smokeless tobacco: Never  Vaping Use   Vaping status: Former  Substance and Sexual Activity   Alcohol  use: Yes    Comment: 3 beers 1-2 x per week   Drug use: No    Comment: 05-21-2016 per pt 2 yrs ago   Sexual activity: Not Currently    Birth control/protection: None  Other Topics Concern   Not on file  Social History Narrative   Not on file   Social Determinants of Health   Financial Resource Strain: Low Risk  (02/05/2023)   Received from [facility]   Overall Financial Resource Strain (CARDIA)    Difficulty of Paying Living Expenses: Not very hard  Food Insecurity: No Food Insecurity (06/26/2022)   [facility], [facility]   Hunger Vital Sign    Worried About Running Out of Food in the Last Year: Never true    Ran Out of Food in the Last Year: Never true   Transportation Needs: No Transportation Needs (02/05/2023)   Received from [facility] - Transportation    Lack of Transportation (Medical): No    Lack of Transportation (Non-Medical): No  Physical Activity: Not on file  Stress: Stress Concern Present (12/19/2021)   Received from [facility], [facility]   [facility] - Occupational Stress Questionnaire    Feeling of Stress : To some extent  Social Connections: Not on file    Allergies:  Allergies  Allergen Reactions   Ciprofloxacin Other (See Comments)    Tendonitis    Lisinopril Other (See Comments)    Hand numbness   Penicillins Hives    Has patient had a PCN reaction causing immediate rash, facial/tongue/throat swelling, SOB or lightheadedness with hypotension: No Has patient had a PCN reaction causing severe rash involving mucus membranes or skin necrosis: No Has patient had a PCN reaction that required hospitalization No Has patient had a PCN reaction occurring within the last 10 years: No If all of the above answers are "NO", then may proceed with Cephalosporin use.    Nitrofurantoin Itching    Metabolic Disorder Labs: No results found for: "HGBA1C", "MPG" No results found for: "PROLACTIN" No results found for: "CHOL", "TRIG", "HDL", "CHOLHDL", "VLDL", "LDLCALC" Lab Results  Component Value Date   TSH 0.635 07/31/2016    Therapeutic Level Labs: No results found for: "LITHIUM" No results found for: "VALPROATE" No results found for: "CBMZ"  Current Medications: Current Outpatient Medications  Medication Sig Dispense Refill   ALPRAZolam (XANAX) 1 MG tablet Take 1 tablet (1 mg total) by mouth 3 (three) times daily. 90 tablet 2   amLODipine (NORVASC) 5 MG tablet Take 5 mg by mouth daily.     omeprazole (PRILOSEC) 20 MG capsule Take 1 capsule (20 mg total) by mouth daily. 30 capsule 3   sertraline (ZOLOFT) 50 MG tablet Take 1 tablet (50 mg total) by mouth daily. 30  tablet 2   No current facility-administered medications for this visit.     Musculoskeletal: Strength & Muscle Tone: within normal limits Gait & Station: normal Patient leans: N/A  Psychiatric Specialty Exam: Review of Systems  All other systems reviewed and are negative.   There were no vitals taken for this visit.There is no height or weight on file to calculate BMI.  General Appearance: Casual and Fairly Groomed  Eye Contact:  Good  Speech:  Clear and Coherent  Volume:  Normal  Mood:  Euthymic  Affect:  Congruent  Thought Process:  Goal Directed  Orientation:  Full (Time, Place, and Person)  Thought  Content: WDL   Suicidal Thoughts:  No  Homicidal Thoughts:  No  Memory:  Immediate;   Good Recent;   Good Remote;   Good  Judgement:  Good  Insight:  Fair  Psychomotor Activity:  Normal  Concentration:  Concentration: Good and Attention Span: Good  Recall:  Good  Fund of Knowledge: Good  Language: Good  Akathisia:  No  Handed:  Right  AIMS (if indicated): not done  Assets:  Communication Skills Desire for Improvement Physical Health Resilience Social Support Talents/Skills  ADL's:  Intact  Cognition: WNL  Sleep:  Good   Screenings: GAD-7    Flowsheet Row Counselor from 10/09/2021 in [facility] at [place] Office Visit from 04/19/2016 in [facility] Office Visit from 02/28/2016 in [facility]  Total GAD-7 Score 18 20 19       PHQ2-9    Flowsheet Row Video Visit from 09/26/2022 in [facility] at [place] Video Visit from 06/01/2022 in [facility] at [place] Video Visit from 05/04/2022 in [facility] at [place] Video Visit from 02/23/2022 in [facility] at [place] Video Visit from 01/04/2022 in [facility] Total Score 5 1 2  0 2  PHQ-9 Total Score 11 -- 5 -- 5      Flowsheet Row Video Visit from 09/26/2022 in [facility] at [place] Video Visit from 06/01/2022 in [facility] at [place] Video Visit from 05/04/2022 in [facility] at [employer]  C-SSRS RISK CATEGORY No Risk No Risk No Risk        Assessment and Plan: This patient is a 27 year old female with a history of anxiety and depression.  She is doing well on her current regimen.  She will continue Zoloft 50 mg daily as well as Xanax 1 mg 2-3 times daily as needed for anxiety.  She will return to see me in 3 months  Collaboration of Care: Collaboration of Care: Primary Care Provider AEB notes are shared with PCP on the epic system  Patient/Guardian was advised Release of Information must be obtained prior to any record release in order to collaborate their care with an outside provider. Patient/Guardian was advised if they have not already done so to contact the registration department to sign all necessary forms in order for [place] to release information regarding their care.   Consent: Patient/Guardian gives verbal consent for treatment and assignment of benefits for services provided during this visit. Patient/Guardian expressed understanding and agreed to proceed.    Kayla Ruder, MD 03/04/2023, 3:23 PM

## 2023-03-29 ENCOUNTER — Telehealth (INDEPENDENT_AMBULATORY_CARE_PROVIDER_SITE_OTHER): Payer: BC Managed Care – PPO | Admitting: Psychiatry

## 2023-03-29 ENCOUNTER — Encounter (HOSPITAL_COMMUNITY): Payer: Self-pay | Admitting: Psychiatry

## 2023-03-29 DIAGNOSIS — F324 Major depressive disorder, single episode, in partial remission: Secondary | ICD-10-CM | POA: Diagnosis not present

## 2023-03-29 DIAGNOSIS — F411 Generalized anxiety disorder: Secondary | ICD-10-CM

## 2023-03-29 NOTE — Progress Notes (Signed)
Virtual Visit via Video Note  I connected with Kayla Sanchez on 03/29/23 at 10:20 AM EDT by a video enabled telemedicine application and verified that I am speaking with the correct person using two identifiers.  Location: Patient: home Provider: office   I discussed the limitations of evaluation and management by telemedicine and the availability of in person appointments. The patient expressed understanding and agreed to proceed.     I discussed the assessment and treatment plan with the patient. The patient was provided an opportunity to ask questions and all were answered. The patient agreed with the plan and demonstrated an understanding of the instructions.   The patient was advised to call back or seek an in-person evaluation if the symptoms worsen or if the condition fails to improve as anticipated.  I provided 20 minutes of non-face-to-face time during this encounter.   [name], MD  [facility] MD/PA/NP OP Progress Note  03/29/2023 10:22 AM Kayla Sanchez  MRN:  [id number]  Chief Complaint:  Chief Complaint  Patient presents with   Anxiety   Depression   HPI: This patient is a 27 year old single white female who lives with her 2 children in [place].  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  The patient returns for follow-up after about 4 weeks as a work in today.  She states that she is very stressed related to her job.  She states that her boss had a meeting with her and admonished her for missing work last January when she had COVID.  They also claimed that she has been late for work several times.  They told her if she missed any more time she would be fired.  Now she is extremely anxious about losing her job.  She did speak to HR at her company and they suggested FMLA 1 or 2 days a month which I think is reasonable given her anxiety.  She states in general she likes the job but she does not like the way the boss is treating her.  She is  going to try to hang onto it and feels that having FMLA will give her a little bit more support.  I told her I be willing to fill out the paperwork for her.  She still feels like the medications are helping "is much as they can."  She denies any thoughts of suicide or self-harm Visit Diagnosis:    ICD-10-CM   1. Major depressive disorder with single episode, in partial remission (HCC)  F32.4     2. GAD (generalized anxiety disorder)  F41.1       Past Psychiatric History: none  Past Medical History:  Past Medical History:  Diagnosis Date   Anxiety    Depression    GERD (gastroesophageal reflux disease)     Past Surgical History:  Procedure Laterality Date   WISDOM TOOTH EXTRACTION      Family Psychiatric History: See below  Family History:  Family History  Problem Relation Age of Onset   Depression Mother    Anxiety disorder Mother    Drug abuse Father    Anxiety disorder Maternal Aunt    Alcohol abuse Maternal Grandfather    Anxiety disorder Maternal Grandfather     Social History:  Social History   Socioeconomic History   Marital status: Single    Spouse name: Not on file   Number of children: Not on file   Years of education: Not on file   Highest  education level: Not on file  Occupational History   Not on file  Tobacco Use   Smoking status: Every Day    Current packs/day: 1.00    Average packs/day: 1 pack/day for 4.0 years (4.0 ttl pk-yrs)    Types: Cigarettes   Smokeless tobacco: Never  Vaping Use   Vaping status: Former  Substance and Sexual Activity   Alcohol use: Yes    Comment: 3 beers 1-2 x per week   Drug use: No    Comment: 05-21-2016 per pt 2 yrs ago   Sexual activity: Not Currently    Birth control/protection: None  Other Topics Concern   Not on file  Social History Narrative   Not on file   Social Determinants of Health   Financial Resource Strain: Low Risk  (02/05/2023)   [facility]   Overall Financial Resource  Strain (CARDIA)    Difficulty of Paying Living Expenses: Not very hard  Food Insecurity: No Food Insecurity (06/26/2022)   Received [facility], [facility]   Hunger Vital Sign    Worried About Running Out of Food in the Last Year: Never true    Ran Out of Food in the Last Year: Never true  Transportation Needs: No Transportation Needs (02/05/2023)   Received from [facility]   PRAPARE - Transportation    Lack of Transportation (Medical): No    Lack of Transportation (Non-Medical): No  Physical Activity: Not on file  Stress: Stress Concern Present (12/19/2021)   [facility], [facility]   [facility] - Occupational Stress Questionnaire    Feeling of Stress : To some extent  Social Connections: Not on file    Allergies:  Allergies  Allergen Reactions   Ciprofloxacin Other (See Comments)    Tendonitis    Lisinopril Other (See Comments)    Hand numbness   Penicillins Hives    Has patient had a PCN reaction causing immediate rash, facial/tongue/throat swelling, SOB or lightheadedness with hypotension: No Has patient had a PCN reaction causing severe rash involving mucus membranes or skin necrosis: No Has patient had a PCN reaction that required hospitalization No Has patient had a PCN reaction occurring within the last 10 years: No If all of the above answers are "NO", then may proceed with Cephalosporin use.    Nitrofurantoin Itching    Metabolic Disorder Labs: No results found for: "HGBA1C", "MPG" No results found for: "PROLACTIN" No results found for: "CHOL", "TRIG", "HDL", "CHOLHDL", "VLDL", "LDLCALC" Lab Results  Component Value Date   TSH 0.635 07/31/2016    Therapeutic Level Labs: No results found for: "LITHIUM" No results found for: "VALPROATE" No results found for: "CBMZ"  Current Medications: Current Outpatient Medications  Medication Sig Dispense Refill   ALPRAZolam (XANAX) 1 MG tablet Take 1  tablet (1 mg total) by mouth 3 (three) times daily. 90 tablet 2   amLODipine (NORVASC) 5 MG tablet Take 5 mg by mouth daily.     omeprazole (PRILOSEC) 20 MG capsule Take 1 capsule (20 mg total) by mouth daily. 30 capsule 3   sertraline (ZOLOFT) 50 MG tablet Take 1 tablet (50 mg total) by mouth daily. 30 tablet 2   No current facility-administered medications for this visit.     Musculoskeletal: Strength & Muscle Tone: within normal limits Gait & Station: normal Patient leans: N/A  Psychiatric Specialty Exam: Review of Systems  Psychiatric/Behavioral:  The patient is nervous/anxious.  All other systems reviewed and are negative.   There were no vitals taken for this visit.There is no height or weight on file to calculate BMI.  General Appearance: Casual and Fairly Groomed  Eye Contact:  Good  Speech:  Clear and Coherent  Volume:  Normal  Mood:  Anxious  Affect:  Congruent  Thought Process:  Goal Directed  Orientation:  Full (Time, Place, and Person)  Thought Content: Rumination   Suicidal Thoughts:  No  Homicidal Thoughts:  No  Memory:  Immediate;   Good Recent;   Good Remote;   Good  Judgement:  Good  Insight:  Good  Psychomotor Activity:  Normal  Concentration:  Concentration: Good and Attention Span: Good  Recall:  Good  Fund of Knowledge: Good  Language: Good  Akathisia:  No  Handed:  Right  AIMS (if indicated): not done  Assets:  Communication Skills Desire for Improvement Physical Health Resilience Social Support Talents/Skills  ADL's:  Intact  Cognition: WNL  Sleep:  Good   Screenings: GAD-7    Flowsheet Row Counselor from 10/09/2021 in [facility] at [place] Office Visit from 04/19/2016 in [facility] Office Visit from 02/28/2016 in [facility]  Total GAD-7 Score 18 20 19       PHQ2-9    Flowsheet Row Video Visit from 09/26/2022 in [facility] at [place] Video Visit from 06/01/2022 in [facility] at [place] Video Visit from 05/04/2022 in [facility] at [place] Video Visit from 02/23/2022 in [facility]patient [facility] at [place] Video Visit from 01/04/2022 in [facility] Total Score 5 1 2  0 2  PHQ-9 Total Score 11 -- 5 -- 5      Flowsheet Row Video Visit from 09/26/2022 in [facility] at [place] Video Visit from 06/01/2022 in [facility] at [place] Video Visit from 05/04/2022 in [facility] at [place]  C-SSRS RISK CATEGORY No Risk No Risk No Risk        Assessment and Plan: This patient has a 27 year old female with a history of anxiety depression.  She has been more anxious because of stress related to her work.  I will fill out paperwork for FMLA.  For now she will continue Zoloft 50 mg daily for depression and anxiety as well as Xanax 1 mg 2-3 times daily as needed for anxiety.  She will return to see me in 6 weeks  Collaboration of Care: Collaboration of Care: Other patient states that she will look for a therapist on her own.  Patient/Guardian was advised Release of Information must be obtained prior to any record release in order to collaborate their care with an outside provider. Patient/Guardian was advised if they have not already done so to contact the registration department to sign all necessary forms in order for [place] to release information regarding their care.   Consent: Patient/Guardian gives verbal consent for treatment and assignment of benefits for services provided during this visit. Patient/Guardian expressed understanding and agreed to proceed.    [name], MD 03/29/2023, 10:22 AM

## 2023-04-06 ENCOUNTER — Ambulatory Visit: Payer: BC Managed Care – PPO

## 2023-05-09 ENCOUNTER — Telehealth (HOSPITAL_COMMUNITY): Payer: Self-pay | Admitting: *Deleted

## 2023-05-09 NOTE — Telephone Encounter (Signed)
Forms for FMLA paperwork was received. Provider completed forms. There is no release on file. Staff called patient to inform her of this and was not able to reach her. Left message on voicemail for patient to call offie back and office number was provided on voicemail as well.

## 2023-05-16 ENCOUNTER — Encounter (HOSPITAL_COMMUNITY): Payer: Self-pay | Admitting: Psychiatry

## 2023-05-16 ENCOUNTER — Telehealth (INDEPENDENT_AMBULATORY_CARE_PROVIDER_SITE_OTHER): Payer: Self-pay | Admitting: Psychiatry

## 2023-05-16 DIAGNOSIS — F324 Major depressive disorder, single episode, in partial remission: Secondary | ICD-10-CM

## 2023-05-16 DIAGNOSIS — F411 Generalized anxiety disorder: Secondary | ICD-10-CM

## 2023-05-16 DIAGNOSIS — F410 Panic disorder [episodic paroxysmal anxiety] without agoraphobia: Secondary | ICD-10-CM

## 2023-05-16 MED ORDER — ALPRAZOLAM 1 MG PO TABS: 1.0000 mg | ORAL_TABLET | Freq: Three times a day (TID) | ORAL | 2 refills | Status: DC

## 2023-05-16 MED ORDER — SERTRALINE HCL 50 MG PO TABS: 50.0000 mg | ORAL_TABLET | Freq: Every day | ORAL | 2 refills | Status: DC

## 2023-05-16 MED ORDER — TRAZODONE HCL 50 MG PO TABS: 50.0000 mg | ORAL_TABLET | Freq: Every day | ORAL | 2 refills | Status: DC

## 2023-05-16 NOTE — Progress Notes (Signed)
Virtual Visit via Video Note  I connected with Kayla Sanchez on 05/16/23 at  9:40 AM EDT by a video enabled telemedicine application and verified that I am speaking with the correct person using two identifiers.  Location: Patient: home Provider: office   I discussed the limitations of evaluation and management by telemedicine and the availability of in person appointments. The patient expressed understanding and agreed to proceed.     I discussed the assessment and treatment plan with the patient. The patient was provided an opportunity to ask questions and all were answered. The patient agreed with the plan and demonstrated an understanding of the instructions.   The patient was advised to call back or seek an in-person evaluation if the symptoms worsen or if the condition fails to improve as anticipated.  I provided 20 minutes of non-face-to-face time during this encounter.   [name], MD  [facility] MD/PA/NP OP Progress Note  05/16/2023 9:59 AM Kayla Sanchez  MRN:  [id number]  Chief Complaint:  Chief Complaint  Patient presents with   Anxiety   Depression   Follow-up   HPI: This patient is a 27 year old single white female who lives with her 2 children in [place].  She now has a new job working for a [employer] for [employer]  The patient returns for follow-up after 6 weeks.  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  She felt like she was constantly being watched and be made just to me that "a each day.  She states that she barely had time to get up to use the restroom.  She tried to get FMLA but instead she was fired.  Fortunately however she got a new job last week and she is really liking it so far.  She is rather anxious because she has to going in person but overall the people are treating her very well.  She is having trouble sleeping so I suggested we add trazodone to her regimen.  She still feels her other  medications are helpful for depression and anxiety.  She denies thoughts of self-harm or suicide. Visit Diagnosis:    ICD-10-CM   1. Major depressive disorder with single episode, in partial remission (HCC)  F32.4     2. GAD (generalized anxiety disorder)  F41.1     3. Panic disorder  F41.0       Past Psychiatric History: none  Past Medical History:  Past Medical History:  Diagnosis Date   Anxiety    Depression    GERD (gastroesophageal reflux disease)     Past Surgical History:  Procedure Laterality Date   WISDOM TOOTH EXTRACTION      Family Psychiatric History: See below  Family History:  Family History  Problem Relation Age of Onset   Depression Mother    Anxiety disorder Mother    Drug abuse Father    Anxiety disorder Maternal Aunt    Alcohol abuse Maternal Grandfather    Anxiety disorder Maternal Grandfather     Social History:  Social History   Socioeconomic History   Marital status: Single    Spouse name: Not on file   Number of children: Not on file   Years of education: Not on file   Highest education level: Not on file  Occupational History   Not on file  Tobacco Use   Smoking status: Every Day    Current packs/day: 1.00    Average packs/day: 1  pack/day for 4.0 years (4.0 ttl pk-yrs)    Types: Cigarettes   Smokeless tobacco: Never  Vaping Use   Vaping status: Former  Substance and Sexual Activity   Alcohol use: Yes    Comment: 3 beers 1-2 x per week   Drug use: No    Comment: 05-21-2016 per pt 2 yrs ago   Sexual activity: Not Currently    Birth control/protection: None  Other Topics Concern   Not on file  Social History Narrative   Not on file   Social Determinants of Health   Financial Resource Strain: Low Risk  (02/05/2023)   [facility]   Overall Financial Resource Strain (CARDIA)    Difficulty of Paying Living Expenses: Not very hard  Food Insecurity: No Food Insecurity (06/26/2022)   [facility], [facility]   Hunger Vital Sign    Worried About Running Out of Food in the Last Year: Never true    Ran Out of Food in the Last Year: Never true  Transportation Needs: No Transportation Needs (02/05/2023)   Received from [facility]   PRAPARE - Transportation    Lack of Transportation (Medical): No    Lack of Transportation (Non-Medical): No  Physical Activity: Not on file  Stress: Stress Concern Present (12/19/2021)   [facility], [facility]   [facility] - Occupational Stress Questionnaire    Feeling of Stress : To some extent  Social Connections: Not on file    Allergies:  Allergies  Allergen Reactions   Ciprofloxacin Other (See Comments)    Tendonitis    Lisinopril Other (See Comments)    Hand numbness   Penicillins Hives    Has patient had a PCN reaction causing immediate rash, facial/tongue/throat swelling, SOB or lightheadedness with hypotension: No Has patient had a PCN reaction causing severe rash involving mucus membranes or skin necrosis: No Has patient had a PCN reaction that required hospitalization No Has patient had a PCN reaction occurring within the last 10 years: No If all of the above answers are "NO", then may proceed with Cephalosporin use.    Nitrofurantoin Itching    Metabolic Disorder Labs: No results found for: "HGBA1C", "MPG" No results found for: "PROLACTIN" No results found for: "CHOL", "TRIG", "HDL", "CHOLHDL", "VLDL", "LDLCALC" Lab Results  Component Value Date   TSH 0.635 07/31/2016    Therapeutic Level Labs: No results found for: "LITHIUM" No results found for: "VALPROATE" No results found for: "CBMZ"  Current Medications: Current Outpatient Medications  Medication Sig Dispense Refill   traZODone (DESYREL) 50 MG tablet Take 1 tablet (50 mg total) by mouth at bedtime. 30 tablet 2   ALPRAZolam (XANAX) 1 MG tablet Take 1 tablet (1 mg total) by mouth 3 (three) times daily.  90 tablet 2   amLODipine (NORVASC) 5 MG tablet Take 5 mg by mouth daily.     omeprazole (PRILOSEC) 20 MG capsule Take 1 capsule (20 mg total) by mouth daily. 30 capsule 3   sertraline (ZOLOFT) 50 MG tablet Take 1 tablet (50 mg total) by mouth daily. 30 tablet 2   No current facility-administered medications for this visit.     Musculoskeletal: Strength & Muscle Tone: within normal limits Gait & Station: normal Patient leans: N/A  Psychiatric Specialty Exam: Review of Systems  Psychiatric/Behavioral:  Positive for sleep disturbance.   All other systems reviewed and are negative.   There were no  vitals taken for this visit.There is no height or weight on file to calculate BMI.  General Appearance: Casual, Neat, and Well Groomed  Eye Contact:  Good  Speech:  Clear and Coherent  Volume:  Normal  Mood:  Anxious and Euthymic  Affect:  Congruent  Thought Process:  Goal Directed  Orientation:  Full (Time, Place, and Person)  Thought Content: WDL   Suicidal Thoughts:  No  Homicidal Thoughts:  No  Memory:  Immediate;   Good Recent;   Good Remote;   Good  Judgement:  Good  Insight:  Good  Psychomotor Activity:  Normal  Concentration:  Concentration: Good and Attention Span: Good  Recall:  Good  Fund of Knowledge: Good  Language: Good  Akathisia:  No  Handed:  Right  AIMS (if indicated): not done  Assets:  Communication Skills Desire for Improvement Physical Health Resilience Social Support Talents/Skills  ADL's:  Intact  Cognition: WNL  Sleep:  poor   Screenings: GAD-7    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 in [facility] [place] Office Visit from 04/19/2016 in [facility] Office Visit from 02/28/2016 in [facility]  Total GAD-7 Score 18 20 19       PHQ2-9    Flowsheet Row Video Visit from 09/26/2022 in [facility] [place] Video  Visit from 06/01/2022 in [facility] at [place] Video Visit from 05/04/2022 in [facility] at [place] Video Visit from 02/23/2022 in [facility] at [place] Video Visit from 01/04/2022 in [facility] Total Score 5 1 2  0 2  PHQ-9 Total Score 11 -- 5 -- 5      Flowsheet Row Video Visit from 09/26/2022 in [facility] at [place] Video Visit from 06/01/2022 in [facility] at [place] Video Visit from 05/04/2022 in [facility] at [place]  C-SSRS RISK CATEGORY No Risk No Risk No Risk        Assessment and Plan: This patient is a 27 year old female with a history of anxiety and depression.  She is having more trouble sleeping since starting the new job so we will add trazodone 50 mg at bedtime for sleep.  She will continue Zoloft 50 mg daily for depression and anxiety as well as Xanax 1 mg 2-3 times daily as needed for anxiety.  She will return to see me in 4 weeks  Collaboration of Care: Collaboration of Care: Primary Care Provider AEB notes will be shared with PCP at patient's request  Patient/Guardian was advised Release of Information must be obtained prior to any record release in order to collaborate their care with an outside provider. Patient/Guardian was advised if they have not already done so to contact the registration department to sign all necessary forms in order for [place] to release information regarding their care.   Consent: Patient/Guardian gives verbal consent for treatment and assignment of benefits for services provided during this visit. Patient/Guardian expressed understanding and agreed to proceed.    [name], MD 05/16/2023, 9:59 AM

## 2023-06-13 ENCOUNTER — Encounter (HOSPITAL_COMMUNITY): Payer: Self-pay | Admitting: Psychiatry

## 2023-06-13 ENCOUNTER — Telehealth (INDEPENDENT_AMBULATORY_CARE_PROVIDER_SITE_OTHER): Payer: Self-pay | Admitting: Psychiatry

## 2023-06-13 DIAGNOSIS — F410 Panic disorder [episodic paroxysmal anxiety] without agoraphobia: Secondary | ICD-10-CM

## 2023-06-13 DIAGNOSIS — F324 Major depressive disorder, single episode, in partial remission: Secondary | ICD-10-CM

## 2023-06-13 DIAGNOSIS — F411 Generalized anxiety disorder: Secondary | ICD-10-CM

## 2023-06-13 MED ORDER — ESCITALOPRAM OXALATE 20 MG PO TABS
30.0000 mg | ORAL_TABLET | Freq: Every day | ORAL | 2 refills | Status: DC
Start: 2023-06-13 — End: 2023-09-09

## 2023-06-13 MED ORDER — ALPRAZOLAM 1 MG PO TABS: 1.0000 mg | ORAL_TABLET | Freq: Three times a day (TID) | ORAL | 2 refills | Status: DC

## 2023-06-13 NOTE — Progress Notes (Signed)
Virtual Visit via Telephone Note  I connected with Kayla Sanchez on 06/13/23 at  1:20 PM EST by telephone and verified that I am speaking with the correct person using two identifiers.  Location: Patient: home Provider: office   I discussed the limitations, risks, security and privacy concerns of performing an evaluation and management service by telephone and the availability of in person appointments. I also discussed with the patient that there may be a patient responsible charge related to this service. The patient expressed understanding and agreed to proceed.      I discussed the assessment and treatment plan with the patient. The patient was provided an opportunity to ask questions and all were answered. The patient agreed with the plan and demonstrated an understanding of the instructions.   The patient was advised to call back or seek an in-person evaluation if the symptoms worsen or if the condition fails to improve as anticipated.  I provided 20 minutes of non-face-to-face time during this encounter.   [name], MD  [facility] MD/PA/NP OP Progress Note  06/13/2023 1:34 PM Kayla Sanchez  MRN:  [id number]  Chief Complaint:  Chief Complaint  Patient presents with   Depression   Anxiety   Follow-up   HPI: This patient is a 27 year old single white female who lives with her boyfriend and 2 children in [place].  She is currently unemployed.  The patient returns for follow-up after 4 weeks.  Last time she had gotten a new job working at [employer] in [place].  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  She states that she could handle the new in person job because she felt so anxious and overwhelmed all the time.  She was having constant panic attacks and finally quit 2 weeks ago.  She still feels anxious all the time and spends most of her day sleeping even though she slept all night.  She does not have much energy and is constantly worried.  She denies any  thoughts of self-harm or suicide.  She is not sure about going back to work immediately as her boyfriend is helping her financially but if she does she will probably work part-time.  She states in retrospect she felt the best on Lexapro so we will try to switch back from the Zoloft as it does not seem to be helping.  The Xanax continues to help her anxiety Visit Diagnosis:    ICD-10-CM   1. GAD (generalized anxiety disorder)  F41.1     2. Panic disorder  F41.0 escitalopram (LEXAPRO) 20 MG tablet    3. Major depressive disorder with single episode, in partial remission (HCC)  F32.4       Past Psychiatric History: none  Past Medical History:  Past Medical History:  Diagnosis Date   Anxiety    Depression    GERD (gastroesophageal reflux disease)     Past Surgical History:  Procedure Laterality Date   WISDOM TOOTH EXTRACTION      Family Psychiatric History: See below  Family History:  Family History  Problem Relation Age of Onset   Depression Mother    Anxiety disorder Mother    Drug abuse Father    Anxiety disorder Maternal Aunt    Alcohol abuse Maternal Grandfather    Anxiety disorder Maternal Grandfather     Social History:  Social History   Socioeconomic History   Marital status: Single    Spouse name: Not on file   Number of  children: Not on file   Years of education: Not on file   Highest education level: Not on file  Occupational History   Not on file  Tobacco Use   Smoking status: Every Day    Current packs/day: 1.00    Average packs/day: 1 pack/day for 4.0 years (4.0 ttl pk-yrs)    Types: Cigarettes   Smokeless tobacco: Never  Vaping Use   Vaping status: Former  Substance and Sexual Activity   Alcohol use: Yes    Comment: 3 beers 1-2 x per week   Drug use: No    Comment: 05-21-2016 per pt 2 yrs ago   Sexual activity: Not Currently    Birth control/protection: None  Other Topics Concern   Not on file  Social History Narrative   Not on file    Social Determinants of Health   Financial Resource Strain: Low Risk  (02/05/2023)   [facility]   Overall Financial Resource Strain (CARDIA)    Difficulty of Paying Living Expenses: Not very hard  Food Insecurity: No Food Insecurity (06/26/2022)   [facility]   Hunger Vital Sign    Worried About Running Out of Food in the Last Year: Never true    Ran Out of Food in the Last Year: Never true  Transportation Needs: No Transportation Needs (02/05/2023)   Received from [facility]   PRAPARE - Transportation    Lack of Transportation (Medical): No    Lack of Transportation (Non-Medical): No  Physical Activity: Not on file  Stress: Stress Concern Present (12/19/2021)   [facility] from [facility], [facility]   [facility] - Occupational Stress Questionnaire    Feeling of Stress : To some extent  Social Connections: Not on file    Allergies:  Allergies  Allergen Reactions   Ciprofloxacin Other (See Comments)    Tendonitis    Lisinopril Other (See Comments)    Hand numbness   Penicillins Hives    Has patient had a PCN reaction causing immediate rash, facial/tongue/throat swelling, SOB or lightheadedness with hypotension: No Has patient had a PCN reaction causing severe rash involving mucus membranes or skin necrosis: No Has patient had a PCN reaction that required hospitalization No Has patient had a PCN reaction occurring within the last 10 years: No If all of the above answers are "NO", then may proceed with Cephalosporin use.    Nitrofurantoin Itching    Metabolic Disorder Labs: No results found for: "HGBA1C", "MPG" No results found for: "PROLACTIN" No results found for: "CHOL", "TRIG", "HDL", "CHOLHDL", "VLDL", "LDLCALC" Lab Results  Component Value Date   TSH 0.635 07/31/2016    Therapeutic Level Labs: No results found for: "LITHIUM" No results found for: "VALPROATE" No  results found for: "CBMZ"  Current Medications: Current Outpatient Medications  Medication Sig Dispense Refill   ALPRAZolam (XANAX) 1 MG tablet Take 1 tablet (1 mg total) by mouth 3 (three) times daily. 90 tablet 2   amLODipine (NORVASC) 5 MG tablet Take 5 mg by mouth daily.     escitalopram (LEXAPRO) 20 MG tablet Take 1.5 tablets (30 mg total) by mouth daily. 45 tablet 2   omeprazole (PRILOSEC) 20 MG capsule Take 1 capsule (20 mg total) by mouth daily. 30 capsule 3   sertraline (ZOLOFT) 50 MG tablet Take 1 tablet (50 mg total) by mouth daily. 30 tablet 2   No current facility-administered medications for this  visit.     Musculoskeletal: Strength & Muscle Tone: within normal limits Gait & Station: normal Patient leans: N/A  Psychiatric Specialty Exam: Review of Systems  Psychiatric/Behavioral:  Positive for dysphoric mood. The patient is nervous/anxious.   All other systems reviewed and are negative.   There were no vitals taken for this visit.There is no height or weight on file to calculate BMI.  General Appearance: Casual and Fairly Groomed  Eye Contact:  Good  Speech:  Clear and Coherent  Volume:  Normal  Mood:  Anxious and Depressed  Affect:  Congruent  Thought Process:  Goal Directed  Orientation:  Full (Time, Place, and Person)  Thought Content: Rumination   Suicidal Thoughts:  No  Homicidal Thoughts:  No  Memory:  Immediate;   Good Recent;   Good Remote;   Good  Judgement:  Good  Insight:  Fair  Psychomotor Activity:  Decreased  Concentration:  Concentration: Fair and Attention Span: Fair  Recall:  Good  Fund of Knowledge: Good  Language: Good  Akathisia:  No  Handed:  Right  AIMS (if indicated): not done  Assets:  Communication Skills Desire for Improvement Physical Health Resilience Social Support  ADL's:  Intact  Cognition: WNL  Sleep:  Good   Screenings: GAD-7    Flowsheet Row Counselor from 10/09/2021 in [facility] [place] Office Visit from 04/19/2016 in [facility] Office Visit from 02/28/2016 in [facility]  Total GAD-7 Score 18 20 19       PHQ2-9    Flowsheet Row Video Visit from 09/26/2022 in [facility] [place] Video Visit from 06/01/2022 in [facility] at [place] Video Visit from 05/04/2022 in [facility] at [place] Video Visit from 02/23/2022 in [facility] at [place] Video Visit from 01/04/2022 in [facility] Total Score 5 1 2  0 2  PHQ-9 Total Score 11 -- 5 -- 5      Flowsheet Row Video Visit from 09/26/2022 in [facility] at [place] Video Visit from 06/01/2022 in [facility] at [place] Video Visit from 05/04/2022 in [facility] at [place]  C-SSRS RISK CATEGORY No Risk No Risk No Risk        Assessment and Plan: This patient is a 27 year old female with a history of anxiety and depression.  She is feeling more overwhelmed and anxious and in retrospect feels like she did better on Lexapro.  Her last dose was 30 mg daily.  She will taper off Zoloft while starting the Lexapro.  She will continue Xanax 1 mg 2-3 times daily as needed for anxiety.  She will return to see me in 4 weeks  Collaboration of Care: Collaboration of Care: Referral or follow-up with counselor/therapist AEB patient will be referred back to [facility] for therapy  Patient/Guardian was advised Release of Information must be obtained prior to any record release in order to collaborate their care with an outside provider. Patient/Guardian was advised if they have not already done so to contact the registration department to sign all necessary forms in order for [place] to release information  regarding their care.   Consent: Patient/Guardian gives verbal consent for treatment and assignment of benefits for services provided during this visit. Patient/Guardian expressed understanding and agreed to proceed.    [name],  MD 06/13/2023, 1:34 PM

## 2023-06-19 ENCOUNTER — Telehealth (HOSPITAL_COMMUNITY): Payer: Self-pay

## 2023-06-19 NOTE — Telephone Encounter (Signed)
Called pt to schedule appt with Kayla Sanchez as Kayla Sanchez said she will see her no answer left vm

## 2023-07-15 ENCOUNTER — Encounter (HOSPITAL_COMMUNITY): Payer: Self-pay | Admitting: Psychiatry

## 2023-07-15 ENCOUNTER — Telehealth (INDEPENDENT_AMBULATORY_CARE_PROVIDER_SITE_OTHER): Payer: Medicaid Other | Admitting: Psychiatry

## 2023-07-15 DIAGNOSIS — F411 Generalized anxiety disorder: Secondary | ICD-10-CM | POA: Diagnosis not present

## 2023-07-15 DIAGNOSIS — F324 Major depressive disorder, single episode, in partial remission: Secondary | ICD-10-CM | POA: Diagnosis not present

## 2023-07-15 DIAGNOSIS — F410 Panic disorder [episodic paroxysmal anxiety] without agoraphobia: Secondary | ICD-10-CM | POA: Diagnosis not present

## 2023-07-15 NOTE — Progress Notes (Signed)
Virtual Visit via Video Note  I connected with Kayla Sanchez on 07/15/23 at 11:20 AM EST by a video enabled telemedicine application and verified that I am speaking with the correct person using two identifiers.  Location: Patient: home Provider: office   I discussed the limitations of evaluation and management by telemedicine and the availability of in person appointments. The patient expressed understanding and agreed to proceed.     I discussed the assessment and treatment plan with the patient. The patient was provided an opportunity to ask questions and all were answered. The patient agreed with the plan and demonstrated an understanding of the instructions.   The patient was advised to call back or seek an in-person evaluation if the symptoms worsen or if the condition fails to improve as anticipated.  I provided 20 minutes of non-face-to-face time during this encounter.   [name], MD  [facility] MD/PA/NP OP Progress Note  07/15/2023 11:39 AM Kayla Sanchez  MRN:  [id number]  Chief Complaint:  Chief Complaint  Patient presents with   Headache   Anxiety   Depression   Follow-up   HPI: This patient is a 27 year old single white female who lives with her boyfriend and 2 children in [place]. She is currently unemployed.   The patient returns for follow-up again after 4 weeks regarding her depression and primarily anxiety.  She has not yet found a new job and is waiting to try again after the holidays.  She states that she was afraid to switch from Zoloft to Lexapro even though she knows that in the past Lexapro seemed to help her more.  Explained that she will need to overlap the 25 mg of Zoloft with the Lexapro for the first week and she was stated it made her "feel weird."  However she is not sure if this was actually her anxiety.  She is going to try it again.  She denies any thoughts of self-harm or suicide.  The Xanax continues to help her anxiety.  Sometimes she has  trouble sleeping but still has the trazodone and has not yet tried it.  I urged her to get more exercise.  She is complaining of headache but she is seeing her PCP tomorrow Visit Diagnosis:    ICD-10-CM   1. Panic disorder  F41.0     2. GAD (generalized anxiety disorder)  F41.1     3. Major depressive disorder with single episode, in partial remission (HCC)  F32.4       Past Psychiatric History: none  Past Medical History:  Past Medical History:  Diagnosis Date   Anxiety    Depression    GERD (gastroesophageal reflux disease)     Past Surgical History:  Procedure Laterality Date   WISDOM TOOTH EXTRACTION      Family Psychiatric History: See below  Family History:  Family History  Problem Relation Age of Onset   Depression Mother    Anxiety disorder Mother    Drug abuse Father    Anxiety disorder Maternal Aunt    Alcohol abuse Maternal Grandfather    Anxiety disorder Maternal Grandfather     Social History:  Social History   Socioeconomic History   Marital status: Single    Spouse name: Not on file   Number of children: Not on file   Years of education: Not on file   Highest education level: Not on file  Occupational History   Not on file  Tobacco Use   Smoking status:  Every Day    Current packs/day: 1.00    Average packs/day: 1 pack/day for 4.0 years (4.0 ttl pk-yrs)    Types: Cigarettes   Smokeless tobacco: Never  Vaping Use   Vaping status: Former  Substance and Sexual Activity   Alcohol use: Yes    Comment: 3 beers 1-2 x per week   Drug use: No    Comment: 05-21-2016 per pt 2 yrs ago   Sexual activity: Not Currently    Birth control/protection: None  Other Topics Concern   Not on file  Social History Narrative   Not on file   Social Drivers of Health   Financial Resource Strain: Low Risk  (02/05/2023)   [facility]   Overall Financial Resource Strain (CARDIA)    Difficulty of Paying Living Expenses: Not very hard  Food  Insecurity: No Food Insecurity (06/26/2022)   [facility]   Hunger Vital Sign    Worried About Running Out of Food in the Last Year: Never true    Ran Out of Food in the Last Year: Never true  Transportation Needs: No Transportation Needs (02/05/2023)   [facility]   PRAPARE - Transportation    Lack of Transportation (Medical): No    Lack of Transportation (Non-Medical): No  Physical Activity: Not on file  Stress: Stress Concern Present (12/19/2021)   [facility] from [facility]   [facility] of Occupational Health - Occupational Stress Questionnaire    Feeling of Stress : To some extent  Social Connections: Not on file    Allergies:  Allergies  Allergen Reactions   Ciprofloxacin Other (See Comments)    Tendonitis    Lisinopril Other (See Comments)    Hand numbness   Penicillins Hives    Has patient had a PCN reaction causing immediate rash, facial/tongue/throat swelling, SOB or lightheadedness with hypotension: No Has patient had a PCN reaction causing severe rash involving mucus membranes or skin necrosis: No Has patient had a PCN reaction that required hospitalization No Has patient had a PCN reaction occurring within the last 10 years: No If all of the above answers are "NO", then may proceed with Cephalosporin use.    Nitrofurantoin Itching    Metabolic Disorder Labs: No results found for: "HGBA1C", "MPG" No results found for: "PROLACTIN" No results found for: "CHOL", "TRIG", "HDL", "CHOLHDL", "VLDL", "LDLCALC" Lab Results  Component Value Date   TSH 0.635 07/31/2016    Therapeutic Level Labs: No results found for: "LITHIUM" No results found for: "VALPROATE" No results found for: "CBMZ"  Current Medications: Current Outpatient Medications  Medication Sig Dispense Refill   ALPRAZolam (XANAX) 1 MG tablet Take 1 tablet (1 mg total) by mouth 3 (three) times daily. 90 tablet 2   amLODipine  (NORVASC) 5 MG tablet Take 5 mg by mouth daily.     escitalopram (LEXAPRO) 20 MG tablet Take 1.5 tablets (30 mg total) by mouth daily. 45 tablet 2   omeprazole (PRILOSEC) 20 MG capsule Take 1 capsule (20 mg total) by mouth daily. 30 capsule 3   sertraline (ZOLOFT) 50 MG tablet Take 1 tablet (50 mg total) by mouth daily. 30 tablet 2   No current facility-administered medications for this visit.     Musculoskeletal: Strength & Muscle Tone: within normal limits Gait & Station: normal Patient leans: N/A  Psychiatric Specialty Exam: Review of Systems  Psychiatric/Behavioral:  Positive for dysphoric mood. The  patient is nervous/anxious.   All other systems reviewed and are negative.   There were no vitals taken for this visit.There is no height or weight on file to calculate BMI.  General Appearance: Casual and Fairly Groomed  Eye Contact:  Good  Speech:  Clear and Coherent  Volume:  Normal  Mood:  Anxious  Affect:  Congruent  Thought Process:  Goal Directed  Orientation:  Full (Time, Place, and Person)  Thought Content: WDL   Suicidal Thoughts:  No  Homicidal Thoughts:  No  Memory:  Immediate;   Good Recent;   Good Remote;   Good  Judgement:  Fair  Insight:  Fair  Psychomotor Activity:  Decreased  Concentration:  Concentration: Good and Attention Span: Good  Recall:  Good  Fund of Knowledge: Good  Language: Good  Akathisia:  No  Handed:  Right  AIMS (if indicated): not done  Assets:  Communication Skills Desire for Improvement Physical Health Resilience Social Support Talents/Skills  ADL's:  Intact  Cognition: WNL  Sleep:  Fair   Screenings: GAD-7    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 in [facility] [place] Office Visit from 04/19/2016 in [facility] Office Visit from 02/28/2016 in [facility]  Total GAD-7 Score 18 20 19       PHQ2-9    Flowsheet Row  Video Visit from 09/26/2022 in [facility] at [place] Video Visit from 06/01/2022 in [facility] [place] Video Visit from 05/04/2022 in [facility] at [place] Video Visit from 02/23/2022 in [facility] at [place] Video Visit from 01/04/2022 in [facility] at [facility] Total Score 5 1 2  0 2  PHQ-9 Total Score 11 -- 5 -- 5      Flowsheet Row Video Visit from 09/26/2022 in [facility] at [place] Video Visit from 06/01/2022 in [facility] at [place] Video Visit from 05/04/2022 in [facility] at [place]  C-SSRS RISK CATEGORY No Risk No Risk No Risk        Assessment and Plan: This patient is a 27 year old female with a history of anxiety and depression.  She again states that she will try to switch from the Zoloft to Lexapro 20 mg daily.  This should help her anxiety and depression.  She will continue Xanax 1 mg 2-3 times daily as needed for anxiety.  She does have trazodone 50 mg to help with sleep.  She will return to see me in 6 weeks  Collaboration of Care: Collaboration of Care: Referral or follow-up with counselor/therapist AEB patient has been referred to [facility] for therapy  Patient/Guardian was advised Release of Information must be obtained prior to any record release in order to collaborate their care with an outside provider. Patient/Guardian was advised if they have not already done so to contact the registration department to sign all necessary forms in order for [place] to release information regarding their care.   Consent: Patient/Guardian gives verbal consent for treatment and assignment of benefits for services provided during this visit. Patient/Guardian expressed understanding and agreed to proceed.    [name],  MD 07/15/2023, 11:39 AM

## 2023-09-04 ENCOUNTER — Telehealth (HOSPITAL_COMMUNITY): Payer: Medicaid Other | Admitting: Psychiatry

## 2023-09-04 ENCOUNTER — Encounter (HOSPITAL_COMMUNITY): Payer: Self-pay

## 2023-09-06 ENCOUNTER — Telehealth (HOSPITAL_COMMUNITY): Payer: Self-pay | Admitting: *Deleted

## 2023-09-06 NOTE — Telephone Encounter (Signed)
 Patient called stating she will be out of her Xanax  this weekend. Per pt she was suppose to have been seen this past Wednesday. Patient f/u appt is 09/09/23.

## 2023-09-06 NOTE — Telephone Encounter (Signed)
 It was sent on 11/14 with 2 refills, can't be filled before 2/13. Ask pharmacy when she last filled it

## 2023-09-09 ENCOUNTER — Telehealth (INDEPENDENT_AMBULATORY_CARE_PROVIDER_SITE_OTHER): Payer: Medicaid Other | Admitting: Psychiatry

## 2023-09-09 ENCOUNTER — Encounter (HOSPITAL_COMMUNITY): Payer: Self-pay | Admitting: Psychiatry

## 2023-09-09 DIAGNOSIS — F411 Generalized anxiety disorder: Secondary | ICD-10-CM | POA: Diagnosis not present

## 2023-09-09 DIAGNOSIS — F324 Major depressive disorder, single episode, in partial remission: Secondary | ICD-10-CM | POA: Diagnosis not present

## 2023-09-09 DIAGNOSIS — F410 Panic disorder [episodic paroxysmal anxiety] without agoraphobia: Secondary | ICD-10-CM | POA: Diagnosis not present

## 2023-09-09 MED ORDER — SERTRALINE HCL 50 MG PO TABS: 75.0000 mg | ORAL_TABLET | Freq: Every day | ORAL | 2 refills | Status: DC

## 2023-09-09 NOTE — Telephone Encounter (Signed)
 LMOM

## 2023-09-09 NOTE — Progress Notes (Signed)
 Virtual Visit via Video Note  I connected with Kayla Sanchez on 09/09/23 at  3:00 PM EST by a video enabled telemedicine application and verified that I am speaking with the correct person using two identifiers.  Location: Patient: home Provider: office   I discussed the limitations of evaluation and management by telemedicine and the availability of in person appointments. The patient expressed understanding and agreed to proceed.     I discussed the assessment and treatment plan with the patient. The patient was provided an opportunity to ask questions and all were answered. The patient agreed with the plan and demonstrated an understanding of the instructions.   The patient was advised to call back or seek an in-person evaluation if the symptoms worsen or if the condition fails to improve as anticipated.  I provided 20 minutes of non-face-to-face time during this encounter.   Alfredia Annas, MD  [facility] MD/PA/NP OP Progress Note  09/09/2023 3:10 PM Kayla Sanchez  MRN:  [id number]  Chief Complaint:  Chief Complaint  Patient presents with   Anxiety   Depression   Follow-up   HPI: This patient is a 28 year old single white female lives with her boyfriend and 2 children in [place].  She is currently as a cook at a grill.  The patient returns for follow-up after 2 months regarding her depression and anxiety.  She states that since I last saw her she got a part-time job at a [employer] where her mother works.  She is working 20 to 25 hours a week.  For the most part she is liking a lift although it makes her a bit anxious.  She never did switch from Zoloft  to Lexapro  because it "makes me too nervous to switch.  However she states that since her anxiety is worse she would like to go up on the Zoloft .  I suggested that we go slowly such as going from 50 to 75 mg daily.  The Xanax  also continues to help her anxiety.  She is generally sleeping well.  She denies any thoughts of  self-harm or suicide Visit Diagnosis:    ICD-10-CM   1. Panic disorder  F41.0     2. GAD (generalized anxiety disorder)  F41.1     3. Major depressive disorder with single episode, in partial remission (HCC)  F32.4       Past Psychiatric History: none  Past Medical History:  Past Medical History:  Diagnosis Date   Anxiety    Depression    GERD (gastroesophageal reflux disease)     Past Surgical History:  Procedure Laterality Date   WISDOM TOOTH EXTRACTION      Family Psychiatric History: See below  Family History:  Family History  Problem Relation Age of Onset   Depression Mother    Anxiety disorder Mother    Drug abuse Father    Anxiety disorder Maternal Aunt    Alcohol abuse Maternal Grandfather    Anxiety disorder Maternal Grandfather     Social History:  Social History   Socioeconomic History   Marital status: Single    Spouse name: Not on file   Number of children: Not on file   Years of education: Not on file   Highest education level: Not on file  Occupational History   Not on file  Tobacco Use   Smoking status: Every Day    Current packs/day: 1.00    Average packs/day: 1 pack/day for 4.0 years (4.0 ttl pk-yrs)  Types: Cigarettes   Smokeless tobacco: Never  Vaping Use   Vaping status: Former  Substance and Sexual Activity   Alcohol use: Yes    Comment: 3 beers 1-2 x per week   Drug use: No    Comment: 05-21-2016 per pt 2 yrs ago   Sexual activity: Not Currently    Birth control/protection: None  Other Topics Concern   Not on file  Social History Narrative   Not on file   Social Drivers of Health   Financial Resource Strain: Low Risk  (02/05/2023)   Received from [facility]   Overall Financial Resource Strain (CARDIA)    Difficulty of Paying Living Expenses: Not very hard  Food Insecurity: No Food Insecurity (08/08/2023)   [facility] from [facility]   Hunger Vital Sign    Worried About Running Out of Food in the Last Year:  Never true    Ran Out of Food in the Last Year: Never true  Transportation Needs: No Transportation Needs (08/08/2023)   Received from [facility]   PRAPARE - Transportation    Lack of Transportation (Medical): No    Lack of Transportation (Non-Medical): No  Physical Activity: Inactive (08/08/2023)   [facility]   Exercise Vital Sign    Days of Exercise per Week: 0 days    Minutes of Exercise per Session: 0 min  Stress: Stress Concern Present (08/08/2023)   Received from [facility] of Occupational Health - Occupational Stress Questionnaire    Feeling of Stress : Rather much  Social Connections: Socially Isolated (08/08/2023)   Received from [facility]   Social Connection and Isolation Panel [NHANES]    Frequency of Communication with Friends and Family: More than three times a week    Frequency of Social Gatherings with Friends and Family: More than three times a week    Attends Religious Services: Never    Database administrator or Organizations: No    Attends Banker Meetings: Never    Marital Status: Never married    Allergies:  Allergies  Allergen Reactions   Ciprofloxacin Other (See Comments)    Tendonitis    Lisinopril Other (See Comments)    Hand numbness   Penicillins Hives    Has patient had a PCN reaction causing immediate rash, facial/tongue/throat swelling, SOB or lightheadedness with hypotension: No Has patient had a PCN reaction causing severe rash involving mucus membranes or skin necrosis: No Has patient had a PCN reaction that required hospitalization No Has patient had a PCN reaction occurring within the last 10 years: No If all of the above answers are "NO", then may proceed with Cephalosporin use.    Nitrofurantoin Itching    Metabolic Disorder Labs: No results found for: "HGBA1C", "MPG" No results found for: "PROLACTIN" No results found for: "CHOL", "TRIG", "HDL", "CHOLHDL", "VLDL",  "LDLCALC" Lab Results  Component Value Date   TSH 0.635 07/31/2016    Therapeutic Level Labs: No results found for: "LITHIUM" No results found for: "VALPROATE" No results found for: "CBMZ"  Current Medications: Current Outpatient Medications  Medication Sig Dispense Refill   amLODipine (NORVASC) 10 MG tablet Take 1 tablet by mouth daily.     ALPRAZolam  (XANAX ) 1 MG tablet Take 1 tablet (1 mg total) by mouth 3 (three) times daily. 90 tablet 2   omeprazole  (PRILOSEC) 20 MG capsule Take 1 capsule (20 mg total) by mouth daily. 30 capsule 3  sertraline  (ZOLOFT ) 50 MG tablet Take 1.5 tablets (75 mg total) by mouth daily. 45 tablet 2   No current facility-administered medications for this visit.     Musculoskeletal: Strength & Muscle Tone: within normal limits Gait & Station: normal Patient leans: N/A  Psychiatric Specialty Exam: Review of Systems  Psychiatric/Behavioral:  The patient is nervous/anxious.   All other systems reviewed and are negative.   There were no vitals taken for this visit.There is no height or weight on file to calculate BMI.  General Appearance: Casual and Fairly Groomed  Eye Contact:  Good  Speech:  Clear and Coherent  Volume:  Normal  Mood:  Euthymic but somewhat anxious  Affect:  Congruent  Thought Process:  Goal Directed  Orientation:  Full (Time, Place, and Person)  Thought Content: WDL   Suicidal Thoughts:  No  Homicidal Thoughts:  No  Memory:  Immediate;   Good Recent;   Good Remote;   Good  Judgement:  Good  Insight:  Good  Psychomotor Activity:  Normal  Concentration:  Concentration: Good and Attention Span: Good  Recall:  Good  Fund of Knowledge: Good  Language: Good  Akathisia:  No  Handed:  Right  AIMS (if indicated): not done  Assets:  Communication Skills Desire for Improvement Physical Health Resilience Social Support Talents/Skills  ADL's:  Intact  Cognition: WNL  Sleep:  Good   Screenings: GAD-7    Flowsheet Row  Counselor from 10/09/2021 in [facility] [place] Office Visit from 04/19/2016 in [facility] Office Visit from 02/28/2016 in [facility]  Total GAD-7 Score 18 20 19       PHQ2-9    Flowsheet Row Video Visit from 09/26/2022 in [facility] [facility] at [place] Video Visit from 06/01/2022 in [facility] at [place] Video Visit from 05/04/2022 in [facility] at [place] Video Visit from 02/23/2022 in [facility] at [place] Video Visit from 01/04/2022 in [facility] Total Score 5 1 2  0 2  PHQ-9 Total Score 11 -- 5 -- 5      Flowsheet Row Video Visit from 09/26/2022 in [facility] at [place] Video Visit from 06/01/2022 in [facility] at [place] Video Visit from 05/04/2022 in [facility] at [place]  C-SSRS RISK CATEGORY No Risk No Risk No Risk        Assessment and Plan: This patient is a 28 year old female with a history of anxiety and depression.  Since she is still somewhat anxious we will increase Zoloft  from 50 to 75 mg daily.  She will also continue Xanax  1 mg 2-3 times daily as needed for anxiety.  She will return to see me in 4 weeks  Collaboration of Care: Collaboration of Care: Primary Care Provider AEB dorsally shared with PCP at patient's request  Patient/Guardian was advised Release of Information must be obtained prior to any record release in order to collaborate their care with an outside provider. Patient/Guardian was advised if they have not already done so to contact the registration department to sign all necessary forms in order for us  to release information regarding their care.   Consent: Patient/Guardian gives verbal consent  for treatment and assignment of benefits for services provided during this visit. Patient/Guardian expressed understanding and agreed to proceed.    Alfredia Annas,  MD 09/09/2023, 3:10 PM

## 2023-09-22 ENCOUNTER — Telehealth: Payer: Medicaid Other | Admitting: Physician Assistant

## 2023-09-22 DIAGNOSIS — U071 COVID-19: Secondary | ICD-10-CM

## 2023-09-22 MED ORDER — FLUTICASONE PROPIONATE 50 MCG/ACT NA SUSP: 2.0000 | Freq: Every day | NASAL | 6 refills | Status: AC

## 2023-09-22 MED ORDER — NAPROXEN 500 MG PO TABS: 500.0000 mg | ORAL_TABLET | Freq: Two times a day (BID) | ORAL | 0 refills | Status: AC

## 2023-09-22 MED ORDER — CETIRIZINE HCL 10 MG PO TABS: 10.0000 mg | ORAL_TABLET | Freq: Every day | ORAL | 0 refills | Status: AC

## 2023-09-22 MED ORDER — BENZONATATE 100 MG PO CAPS: 100.0000 mg | ORAL_CAPSULE | Freq: Two times a day (BID) | ORAL | 0 refills | Status: DC | PRN

## 2023-09-22 NOTE — Addendum Note (Signed)
 Addended byLaure Kidney on: 09/22/2023 03:49 PM   Modules accepted: Orders, Level of Service

## 2023-09-22 NOTE — Progress Notes (Addendum)
 Virtual Visit Consent   [name], you are scheduled for a virtual visit with a [facility] provider today. Just as with appointments in the office, your consent must be obtained to participate. Your consent will be active for this visit and any virtual visit you may have with one of our providers in the next 365 days. If you have a MyChart account, a copy of this consent can be sent to you electronically.  As this is a virtual visit, video technology does not allow for your provider to perform a traditional examination. This may limit your provider's ability to fully assess your condition. If your provider identifies any concerns that need to be evaluated in person or the need to arrange testing (such as labs, EKG, etc.), we will make arrangements to do so. Although advances in technology are sophisticated, we cannot ensure that it will always work on either your end or our end. If the connection with a video visit is poor, the visit may have to be switched to a telephone visit. With either a video or telephone visit, we are not always able to ensure that we have a secure connection.  By engaging in this virtual visit, you consent to the provision of healthcare and authorize for your insurance to be billed (if applicable) for the services provided during this visit. Depending on your insurance coverage, you may receive a charge related to this service.  I need to obtain your verbal consent now. Are you willing to proceed with your visit today? Kayla Sanchez has provided verbal consent on 09/22/2023 for a virtual visit (video or telephone). Laure Kidney, [place]  Date: 09/22/2023 3:41 PM   Virtual Visit via Video Note   I, Laure Kidney, connected with  Kayla Sanchez  ([id number], August 16, 1995) on 09/22/23 at  3:30 PM EST by a video-enabled telemedicine application and verified that I am speaking with the correct person using two identifiers.  Location: Patient: Virtual Visit  Location Patient: Home Provider: Virtual Visit Location Provider: Home Office   I discussed the limitations of evaluation and management by telemedicine and the availability of in person appointments. The patient expressed understanding and agreed to proceed.    History of Present Illness: Kayla Sanchez is a 28 y.o. who identifies as a female who was assigned female at birth, and is being seen today for COVID 30.  HPI: URI  This is a new problem. The current episode started today. There has been no fever. Associated symptoms include sinus pain. She has tried NSAIDs for the symptoms. The treatment provided moderate relief.    Problems:  Patient Active Problem List   Diagnosis Date Noted   MDD (major depressive disorder), recurrent episode, severe (HCC) 09/04/2021   Panic disorder 09/04/2021   Depression 12/08/2012   GAD (generalized anxiety disorder) 12/08/2012    Allergies:  Allergies  Allergen Reactions   Ciprofloxacin Other (See Comments)    Tendonitis    Lisinopril Other (See Comments)    Hand numbness   Penicillins Hives    Has patient had a PCN reaction causing immediate rash, facial/tongue/throat swelling, SOB or lightheadedness with hypotension: No Has patient had a PCN reaction causing severe rash involving mucus membranes or skin necrosis: No Has patient had a PCN reaction that required hospitalization No Has patient had a PCN reaction occurring within the last 10 years: No If all of the above answers are "NO", then may proceed with Cephalosporin use.    Nitrofurantoin Itching  Medications:  Current Outpatient Medications:    ALPRAZolam (XANAX) 1 MG tablet, Take 1 tablet (1 mg total) by mouth 3 (three) times daily., Disp: 90 tablet, Rfl: 2   amLODipine (NORVASC) 10 MG tablet, Take 1 tablet by mouth daily., Disp: , Rfl:    omeprazole (PRILOSEC) 20 MG capsule, Take 1 capsule (20 mg total) by mouth daily., Disp: 30 capsule, Rfl: 3   sertraline (ZOLOFT) 50 MG  tablet, Take 1.5 tablets (75 mg total) by mouth daily., Disp: 45 tablet, Rfl: 2  Observations/Objective: Patient is well-developed, well-nourished in no acute distress.  Resting comfortably  at home.  Head is normocephalic, atraumatic.  No labored breathing.  Speech is clear and coherent with logical content.  Patient is alert and oriented at baseline.    Assessment and Plan: 1. Patient left without being seen (Primary)  + home covid test. No fevers. Patient presents with symptoms suspicious for likely viral uri . Differentials include bacterial pneumonia, sinusitis, allergic rhinitis. Do not suspect underlying cardiopulmonary process. I considered, but think unlikely, dangerous causes of this patient's symptoms to include ACS, CHF or COPD exacerbations, pneumonia, pneumothorax. Patient is nontoxic appearing and not in need of emergent medical intervention. Benefits vs risk of paxlovid discussed with patient without any significant health risk or factors or symptoms warranting antiviral at this time.   Plan: reassurance, reassessment, over the counter medications, discharge with PCP follow-up  Follow Up Instructions: I discussed the assessment and treatment plan with the patient. The patient was provided an opportunity to ask questions and all were answered. The patient agreed with the plan and demonstrated an understanding of the instructions.  A copy of instructions were sent to the patient via MyChart unless otherwise noted below.    The patient was advised to call back or seek an in-person evaluation if the symptoms worsen or if the condition fails to improve as anticipated.    Laure Kidney, PA-C

## 2023-09-22 NOTE — Patient Instructions (Signed)
 [name], thank you for joining Laure Kidney, PA-C for today's virtual visit.  While this provider is not your primary care provider (PCP), if your PCP is located in our provider database this encounter information will be shared with them immediately following your visit.   A [facility] MyChart account gives you access to today's visit and all your visits, tests, and labs performed at [facility] " click here if you don't have a [facility] MyChart account or go to [email or web address]  Consent: (Patient) Kayla Sanchez provided verbal consent for this virtual visit at the beginning of the encounter.  Current Medications:  Current Outpatient Medications:    benzonatate (TESSALON) 100 MG capsule, Take 1 capsule (100 mg total) by mouth 2 (two) times daily as needed for cough., Disp: 20 capsule, Rfl: 0   cetirizine (ZYRTEC ALLERGY) 10 MG tablet, Take 1 tablet (10 mg total) by mouth daily for 14 days., Disp: 14 tablet, Rfl: 0   fluticasone (FLONASE) 50 MCG/ACT nasal spray, Place 2 sprays into both nostrils daily., Disp: 16 g, Rfl: 6   naproxen (NAPROSYN) 500 MG tablet, Take 1 tablet (500 mg total) by mouth 2 (two) times daily with a meal for 5 days., Disp: 10 tablet, Rfl: 0   ALPRAZolam (XANAX) 1 MG tablet, Take 1 tablet (1 mg total) by mouth 3 (three) times daily., Disp: 90 tablet, Rfl: 2   amLODipine (NORVASC) 10 MG tablet, Take 1 tablet by mouth daily., Disp: , Rfl:    omeprazole (PRILOSEC) 20 MG capsule, Take 1 capsule (20 mg total) by mouth daily., Disp: 30 capsule, Rfl: 3   sertraline (ZOLOFT) 50 MG tablet, Take 1.5 tablets (75 mg total) by mouth daily., Disp: 45 tablet, Rfl: 2   Medications ordered in this encounter:  Meds ordered this encounter  Medications   benzonatate (TESSALON) 100 MG capsule    Sig: Take 1 capsule (100 mg total) by mouth 2 (two) times daily as needed for cough.    Dispense:  20 capsule    Refill:  0    Supervising Provider:    Merrilee Jansky [[id number]]   fluticasone (FLONASE) 50 MCG/ACT nasal spray    Sig: Place 2 sprays into both nostrils daily.    Dispense:  16 g    Refill:  6    Supervising Provider:   Merrilee Jansky [[id number]]   cetirizine (ZYRTEC ALLERGY) 10 MG tablet    Sig: Take 1 tablet (10 mg total) by mouth daily for 14 days.    Dispense:  14 tablet    Refill:  0    Supervising Provider:   Merrilee Jansky [[id number]]   naproxen (NAPROSYN) 500 MG tablet    Sig: Take 1 tablet (500 mg total) by mouth 2 (two) times daily with a meal for 5 days.    Dispense:  10 tablet    Refill:  0    Supervising Provider:   Merrilee Jansky [[id number]]     *If you need refills on other medications prior to your next appointment, please contact your pharmacy*  Follow-Up: Call back or seek an in-person evaluation if the symptoms worsen or if the condition fails to improve as anticipated.  [facility] [phone]  Other Instructions Please report to the nearest Emergency room with any worsening symptoms. Follow up with primary care provider (PCP) in 2 -3 days.    If you have been instructed to have an in-person evaluation today at a local  Urgent Care facility, please use the link below. It will take you to a list of all of our available [facility], including address, phone number and hours of operation. Please do not delay care.  [facility]  If you or a family member do not have a primary care provider, use the link below to schedule a visit and establish care. When you choose a [facility] primary care physician or advanced practice provider, you gain a long-term partner in health. Find a Primary Care Provider  Learn more about [facility] in-office and virtual care options: [facility] Get Care Now

## 2023-09-30 ENCOUNTER — Telehealth: Admitting: Physician Assistant

## 2023-09-30 DIAGNOSIS — J40 Bronchitis, not specified as acute or chronic: Secondary | ICD-10-CM

## 2023-09-30 DIAGNOSIS — J329 Chronic sinusitis, unspecified: Secondary | ICD-10-CM | POA: Diagnosis not present

## 2023-09-30 MED ORDER — PREDNISONE 20 MG PO TABS
20.0000 mg | ORAL_TABLET | Freq: Every day | ORAL | 0 refills | Status: DC
Start: 2023-09-30 — End: 2024-06-09

## 2023-09-30 MED ORDER — AZITHROMYCIN 250 MG PO TABS: ORAL_TABLET | ORAL | 0 refills | Status: AC

## 2023-09-30 MED ORDER — BENZONATATE 100 MG PO CAPS
100.0000 mg | ORAL_CAPSULE | Freq: Three times a day (TID) | ORAL | 0 refills | Status: DC | PRN
Start: 2023-09-30 — End: 2024-06-09

## 2023-09-30 NOTE — Progress Notes (Signed)
 Virtual Visit Consent   Kayla Sanchez, you are scheduled for a virtual visit with a [facility] provider today. Just as with appointments in the office, your consent must be obtained to participate. Your consent will be active for this visit and any virtual visit you may have with one of our providers in the next 365 days. If you have a MyChart account, a copy of this consent can be sent to you electronically.  As this is a virtual visit, video technology does not allow for your provider to perform a traditional examination. This may limit your provider's ability to fully assess your condition. If your provider identifies any concerns that need to be evaluated in person or the need to arrange testing (such as labs, EKG, etc.), we will make arrangements to do so. Although advances in technology are sophisticated, we cannot ensure that it will always work on either your end or our end. If the connection with a video visit is poor, the visit may have to be switched to a telephone visit. With either a video or telephone visit, we are not always able to ensure that we have a secure connection.  By engaging in this virtual visit, you consent to the provision of healthcare and authorize for your insurance to be billed (if applicable) for the services provided during this visit. Depending on your insurance coverage, you may receive a charge related to this service.  I need to obtain your verbal consent now. Are you willing to proceed with your visit today? Kayla Sanchez has provided verbal consent on 09/30/2023 for a virtual visit (video or telephone). Margaretann Loveless, PA-C  Date: 09/30/2023 2:56 PM   Virtual Visit via Video Note   I, Margaretann Loveless, connected with  Kayla Sanchez  ([id number], June 23, 1996) on 09/30/23 at  2:45 PM EST by a video-enabled telemedicine application and verified that I am speaking with the correct person using two identifiers.  Location: Patient: Virtual  Visit Location Patient: Home Provider: Virtual Visit Location Provider: Home Office   I discussed the limitations of evaluation and management by telemedicine and the availability of in person appointments. The patient expressed understanding and agreed to proceed.    History of Present Illness: Kayla Sanchez is a 28 y.o. who identifies as a female who was assigned female at birth, and is being seen today for cough following Covid 19.  HPI: Cough This is a new problem. The current episode started 1 to 4 weeks ago (Seen Virtually on 09/22/23 where she had just tested positive for Covid 19; symptomatic management was chosen due to low risk, however symptoms of cough and congestion have progressed). The problem has been gradually worsening. The problem occurs every few minutes. The cough is Productive of sputum and productive of purulent sputum. Associated symptoms include ear congestion, headaches, myalgias, nasal congestion, postnasal drip, rhinorrhea and wheezing (did have once that improved with cough). Pertinent negatives include no chest pain, chills, ear pain, fever, sore throat or shortness of breath. Associated symptoms comments: Vomiting from cough, fatigue. The symptoms are aggravated by lying down and cold air. Treatments tried: Catering manager, advil. The treatment provided no relief.    Problems:  Patient Active Problem List   Diagnosis Date Noted   MDD (major depressive disorder), recurrent episode, severe (HCC) 09/04/2021   Panic disorder 09/04/2021   Depression 12/08/2012   GAD (generalized anxiety disorder) 12/08/2012    Allergies:  Allergies  Allergen Reactions   Ciprofloxacin Other (  See Comments)    Tendonitis    Lisinopril Other (See Comments)    Hand numbness   Penicillins Hives    Has patient had a PCN reaction causing immediate rash, facial/tongue/throat swelling, SOB or lightheadedness with hypotension: No Has patient had a PCN reaction causing severe rash  involving mucus membranes or skin necrosis: No Has patient had a PCN reaction that required hospitalization No Has patient had a PCN reaction occurring within the last 10 years: No If all of the above answers are "NO", then may proceed with Cephalosporin use.    Nitrofurantoin Itching   Medications:  Current Outpatient Medications:    azithromycin (ZITHROMAX) 250 MG tablet, Take 2 tablets on day 1, then 1 tablet daily on days 2 through 5, Disp: 6 tablet, Rfl: 0   benzonatate (TESSALON) 100 MG capsule, Take 1-2 capsules (100-200 mg total) by mouth 3 (three) times daily as needed., Disp: 30 capsule, Rfl: 0   predniSONE (DELTASONE) 20 MG tablet, Take 1 tablet (20 mg total) by mouth daily with breakfast., Disp: 5 tablet, Rfl: 0   ALPRAZolam (XANAX) 1 MG tablet, Take 1 tablet (1 mg total) by mouth 3 (three) times daily., Disp: 90 tablet, Rfl: 2   amLODipine (NORVASC) 10 MG tablet, Take 1 tablet by mouth daily., Disp: , Rfl:    cetirizine (ZYRTEC ALLERGY) 10 MG tablet, Take 1 tablet (10 mg total) by mouth daily for 14 days., Disp: 14 tablet, Rfl: 0   fluticasone (FLONASE) 50 MCG/ACT nasal spray, Place 2 sprays into both nostrils daily., Disp: 16 g, Rfl: 6   omeprazole (PRILOSEC) 20 MG capsule, Take 1 capsule (20 mg total) by mouth daily., Disp: 30 capsule, Rfl: 3   sertraline (ZOLOFT) 50 MG tablet, Take 1.5 tablets (75 mg total) by mouth daily., Disp: 45 tablet, Rfl: 2  Observations/Objective: Patient is well-developed, well-nourished in no acute distress.  Resting comfortably at home.  Head is normocephalic, atraumatic.  No labored breathing.  Speech is clear and coherent with logical content.  Patient is alert and oriented at baseline.    Assessment and Plan: 1. Sinobronchitis (Primary) - predniSONE (DELTASONE) 20 MG tablet; Take 1 tablet (20 mg total) by mouth daily with breakfast.  Dispense: 5 tablet; Refill: 0 - azithromycin (ZITHROMAX) 250 MG tablet; Take 2 tablets on day 1, then 1  tablet daily on days 2 through 5  Dispense: 6 tablet; Refill: 0 - benzonatate (TESSALON) 100 MG capsule; Take 1-2 capsules (100-200 mg total) by mouth 3 (three) times daily as needed.  Dispense: 30 capsule; Refill: 0  - Worsening over a week despite OTC medications - Will treat with Z-pack, Prednisone and tessalon perles - Can continue Mucinex (PLAIN) - Push fluids.  - Rest.  - Steam and humidifier can help - Seek in person evaluation if worsening or symptoms fail to improve    Follow Up Instructions: I discussed the assessment and treatment plan with the patient. The patient was provided an opportunity to ask questions and all were answered. The patient agreed with the plan and demonstrated an understanding of the instructions.  A copy of instructions were sent to the patient via MyChart unless otherwise noted below.    The patient was advised to call back or seek an in-person evaluation if the symptoms worsen or if the condition fails to improve as anticipated.    Margaretann Loveless, PA-C

## 2023-09-30 NOTE — Patient Instructions (Signed)
 [name], thank you for joining Margaretann Loveless, PA-C for today's virtual visit.  While this provider is not your primary care provider (PCP), if your PCP is located in our provider database this encounter information will be shared with them immediately following your visit.   A [facility] MyChart account gives you access to today's visit and all your visits, tests, and labs performed at [facility] " click here if you don't have a [facility] MyChart account or go to [email or web address]  Consent: (Patient) ABBIGAIL ANSTEY provided verbal consent for this virtual visit at the beginning of the encounter.  Current Medications:  Current Outpatient Medications:    azithromycin (ZITHROMAX) 250 MG tablet, Take 2 tablets on day 1, then 1 tablet daily on days 2 through 5, Disp: 6 tablet, Rfl: 0   benzonatate (TESSALON) 100 MG capsule, Take 1-2 capsules (100-200 mg total) by mouth 3 (three) times daily as needed., Disp: 30 capsule, Rfl: 0   predniSONE (DELTASONE) 20 MG tablet, Take 1 tablet (20 mg total) by mouth daily with breakfast., Disp: 5 tablet, Rfl: 0   ALPRAZolam (XANAX) 1 MG tablet, Take 1 tablet (1 mg total) by mouth 3 (three) times daily., Disp: 90 tablet, Rfl: 2   amLODipine (NORVASC) 10 MG tablet, Take 1 tablet by mouth daily., Disp: , Rfl:    cetirizine (ZYRTEC ALLERGY) 10 MG tablet, Take 1 tablet (10 mg total) by mouth daily for 14 days., Disp: 14 tablet, Rfl: 0   fluticasone (FLONASE) 50 MCG/ACT nasal spray, Place 2 sprays into both nostrils daily., Disp: 16 g, Rfl: 6   omeprazole (PRILOSEC) 20 MG capsule, Take 1 capsule (20 mg total) by mouth daily., Disp: 30 capsule, Rfl: 3   sertraline (ZOLOFT) 50 MG tablet, Take 1.5 tablets (75 mg total) by mouth daily., Disp: 45 tablet, Rfl: 2   Medications ordered in this encounter:  Meds ordered this encounter  Medications   predniSONE (DELTASONE) 20 MG tablet    Sig: Take 1 tablet (20 mg total) by  mouth daily with breakfast.    Dispense:  5 tablet    Refill:  0    Supervising Provider:   Merrilee Jansky [[id number]]   azithromycin (ZITHROMAX) 250 MG tablet    Sig: Take 2 tablets on day 1, then 1 tablet daily on days 2 through 5    Dispense:  6 tablet    Refill:  0    Supervising Provider:   Merrilee Jansky [[id number]]   benzonatate (TESSALON) 100 MG capsule    Sig: Take 1-2 capsules (100-200 mg total) by mouth 3 (three) times daily as needed.    Dispense:  30 capsule    Refill:  0    Supervising Provider:   Merrilee Jansky [[id number]]     *If you need refills on other medications prior to your next appointment, please contact your pharmacy*  Follow-Up: Call back or seek an in-person evaluation if the symptoms worsen or if the condition fails to improve as anticipated.  [facility] [phone]  Other Instructions  Acute Bronchitis, Adult  Acute bronchitis is sudden inflammation of the main airways (bronchi) that come off the windpipe (trachea) in the lungs. The swelling causes the airways to get smaller and make more mucus than normal. This can make it hard to breathe and can cause coughing or noisy breathing (wheezing). Acute bronchitis may last several weeks. The cough may last longer. Allergies, asthma, and exposure to smoke  may make the condition worse. What are the causes? This condition can be caused by germs and by substances that irritate the lungs, including: Cold and flu viruses. The most common cause of this condition is the virus that causes the common cold. Bacteria. This is less common. Breathing in substances that irritate the lungs, including: Smoke from cigarettes and other forms of tobacco. Dust and pollen. Fumes from household cleaning products, gases, or burned fuel. Indoor or outdoor air pollution. What increases the risk? The following factors may make you more likely to develop this condition: A weak body's defense system, also  called the immune system. A condition that affects your lungs and breathing, such as asthma. What are the signs or symptoms? Common symptoms of this condition include: Coughing. This may bring up clear, yellow, or green mucus from your lungs (sputum). Wheezing. Runny or stuffy nose. Having too much mucus in your lungs (chest congestion). Shortness of breath. Aches and pains, including sore throat or chest. How is this diagnosed? This condition is usually diagnosed based on: Your symptoms and medical history. A physical exam. You may also have other tests, including tests to rule out other conditions, such as pneumonia. These tests include: A test of lung function. Test of a mucus sample to look for the presence of bacteria. Tests to check the oxygen level in your blood. Blood tests. Chest X-ray. How is this treated? Most cases of acute bronchitis clear up over time without treatment. Your health care provider may recommend: Drinking more fluids to help thin your mucus so it is easier to cough up. Taking inhaled medicine (inhaler) to improve air flow in and out of your lungs. Using a vaporizer or a humidifier. These are machines that add water to the air to help you breathe better. Taking a medicine that thins mucus and clears congestion (expectorant). Taking a medicine that prevents or stops coughing (cough suppressant). It is not common to take an antibiotic medicine for this condition. Follow these instructions at home:  Take over-the-counter and prescription medicines only as told by your health care provider. Use an inhaler, vaporizer, or humidifier as told by your health care provider. Take two teaspoons (10 mL) of honey at bedtime to lessen coughing at night. Drink enough fluid to keep your urine pale yellow. Do not use any products that contain nicotine or tobacco. These products include cigarettes, chewing tobacco, and vaping devices, such as e-cigarettes. If you need help  quitting, ask your health care provider. Get plenty of rest. Return to your normal activities as told by your health care provider. Ask your health care provider what activities are safe for you. Keep all follow-up visits. This is important. How is this prevented? To lower your risk of getting this condition again: Wash your hands often with soap and water for at least 20 seconds. If soap and water are not available, use hand sanitizer. Avoid contact with people who have cold symptoms. Try not to touch your mouth, nose, or eyes with your hands. Avoid breathing in smoke or chemical fumes. Breathing smoke or chemical fumes will make your condition worse. Get the flu shot every year. Contact a health care provider if: Your symptoms do not improve after 2 weeks. You have trouble coughing up the mucus. Your cough keeps you awake at night. You have a fever. Get help right away if you: Cough up blood. Feel pain in your chest. Have severe shortness of breath. Faint or keep feeling like you are going  to faint. Have a severe headache. Have a fever or chills that get worse. These symptoms may represent a serious problem that is an emergency. Do not wait to see if the symptoms will go away. Get medical help right away. Call your local emergency services (911 in the [place] Do not drive yourself to the hospital. Summary Acute bronchitis is inflammation of the main airways (bronchi) that come off the windpipe (trachea) in the lungs. The swelling causes the airways to get smaller and make more mucus than normal. Drinking more fluids can help thin your mucus so it is easier to cough up. Take over-the-counter and prescription medicines only as told by your health care provider. Do not use any products that contain nicotine or tobacco. These products include cigarettes, chewing tobacco, and vaping devices, such as e-cigarettes. If you need help quitting, ask your health care provider. Contact a health care  provider if your symptoms do not improve after 2 weeks. This information is not intended to replace advice given to you by your health care provider. Make sure you discuss any questions you have with your health care provider. Document Revised: 10/26/2021 Document Reviewed: 11/16/2020 Elsevier Patient Education  2024 [employer]  Sinus Infection, Adult A sinus infection, also called sinusitis, is inflammation of your sinuses. Sinuses are hollow spaces in the bones around your face. Your sinuses are located: Around your eyes. In the middle of your forehead. Behind your nose. In your cheekbones. Mucus normally drains out of your sinuses. When your nasal tissues become inflamed or swollen, mucus can become trapped or blocked. This allows bacteria, viruses, and fungi to grow, which leads to infection. Most infections of the sinuses are caused by a virus. A sinus infection can develop quickly. It can last for up to 4 weeks (acute) or for more than 12 weeks (chronic). A sinus infection often develops after a cold. What are the causes? This condition is caused by anything that creates swelling in the sinuses or stops mucus from draining. This includes: Allergies. Asthma. Infection from bacteria or viruses. Deformities or blockages in your nose or sinuses. Abnormal growths in the nose (nasal polyps). Pollutants, such as chemicals or irritants in the air. Infection from fungi. This is rare. What increases the risk? You are more likely to develop this condition if you: Have a weak body defense system (immune system). Do a lot of swimming or diving. Overuse nasal sprays. Smoke. What are the signs or symptoms? The main symptoms of this condition are pain and a feeling of pressure around the affected sinuses. Other symptoms include: Stuffy nose or congestion that makes it difficult to breathe through your nose. Thick yellow or greenish drainage from your nose. Tenderness, swelling, and warmth  over the affected sinuses. A cough that may get worse at night. Decreased sense of smell and taste. Extra mucus that collects in the throat or the back of the nose (postnasal drip) causing a sore throat or bad breath. Tiredness (fatigue). Fever. How is this diagnosed? This condition is diagnosed based on: Your symptoms. Your medical history. A physical exam. Tests to find out if your condition is acute or chronic. This may include: Checking your nose for nasal polyps. Viewing your sinuses using a device that has a light (endoscope). Testing for allergies or bacteria. Imaging tests, such as an MRI or CT scan. In rare cases, a bone biopsy may be done to rule out more serious types of fungal sinus disease. How is this treated? Treatment for  a sinus infection depends on the cause and whether your condition is chronic or acute. If caused by a virus, your symptoms should go away on their own within 10 days. You may be given medicines to relieve symptoms. They include: Medicines that shrink swollen nasal passages (decongestants). A spray that eases inflammation of the nostrils (topical intranasal corticosteroids). Rinses that help get rid of thick mucus in your nose (nasal saline washes). Medicines that treat allergies (antihistamines). Over-the-counter pain relievers. If caused by bacteria, your health care provider may recommend waiting to see if your symptoms improve. Most bacterial infections will get better without antibiotic medicine. You may be given antibiotics if you have: A severe infection. A weak immune system. If caused by narrow nasal passages or nasal polyps, surgery may be needed. Follow these instructions at home: Medicines Take, use, or apply over-the-counter and prescription medicines only as told by your health care provider. These may include nasal sprays. If you were prescribed an antibiotic medicine, take it as told by your health care provider. Do not stop taking the  antibiotic even if you start to feel better. Hydrate and humidify  Drink enough fluid to keep your urine pale yellow. Staying hydrated will help to thin your mucus. Use a cool mist humidifier to keep the humidity level in your home above 50%. Inhale steam for 10-15 minutes, 3-4 times a day, or as told by your health care provider. You can do this in the bathroom while a hot shower is running. Limit your exposure to cool or dry air. Rest Rest as much as possible. Sleep with your head raised (elevated). Make sure you get enough sleep each night. General instructions  Apply a warm, moist washcloth to your face 3-4 times a day or as told by your health care provider. This will help with discomfort. Use nasal saline washes as often as told by your health care provider. Wash your hands often with soap and water to reduce your exposure to germs. If soap and water are not available, use hand sanitizer. Do not smoke. Avoid being around people who are smoking (secondhand smoke). Keep all follow-up visits. This is important. Contact a health care provider if: You have a fever. Your symptoms get worse. Your symptoms do not improve within 10 days. Get help right away if: You have a severe headache. You have persistent vomiting. You have severe pain or swelling around your face or eyes. You have vision problems. You develop confusion. Your neck is stiff. You have trouble breathing. These symptoms may be an emergency. Get help right away. Call 911. Do not wait to see if the symptoms will go away. Do not drive yourself to the hospital. Summary A sinus infection is soreness and inflammation of your sinuses. Sinuses are hollow spaces in the bones around your face. This condition is caused by nasal tissues that become inflamed or swollen. The swelling traps or blocks the flow of mucus. This allows bacteria, viruses, and fungi to grow, which leads to infection. If you were prescribed an antibiotic  medicine, take it as told by your health care provider. Do not stop taking the antibiotic even if you start to feel better. Keep all follow-up visits. This is important. This information is not intended to replace advice given to you by your health care provider. Make sure you discuss any questions you have with your health care provider. Document Revised: 06/20/2021 Document Reviewed: 06/20/2021 Elsevier Patient Education  2024 [employer].   If you have  been instructed to have an in-person evaluation today at a local Urgent Care facility, please use the link below. It will take you to a list of all of our available [facility], including address, phone number and hours of operation. Please do not delay care.  [facility]  If you or a family member do not have a primary care provider, use the link below to schedule a visit and establish care. When you choose a [facility] primary care physician or advanced practice provider, you gain a long-term partner in health. Find a Primary Care Provider  Learn more about [facility] in-office and virtual care options: [facility] Get [facility] Now

## 2023-10-23 ENCOUNTER — Telehealth (HOSPITAL_COMMUNITY): Admitting: Psychiatry

## 2023-10-29 ENCOUNTER — Encounter (HOSPITAL_COMMUNITY): Payer: Self-pay | Admitting: Psychiatry

## 2023-10-29 ENCOUNTER — Telehealth (INDEPENDENT_AMBULATORY_CARE_PROVIDER_SITE_OTHER): Admitting: Psychiatry

## 2023-10-29 DIAGNOSIS — F411 Generalized anxiety disorder: Secondary | ICD-10-CM | POA: Diagnosis not present

## 2023-10-29 DIAGNOSIS — F410 Panic disorder [episodic paroxysmal anxiety] without agoraphobia: Secondary | ICD-10-CM

## 2023-10-29 DIAGNOSIS — F324 Major depressive disorder, single episode, in partial remission: Secondary | ICD-10-CM | POA: Diagnosis not present

## 2023-10-29 MED ORDER — SERTRALINE HCL 50 MG PO TABS: 50.0000 mg | ORAL_TABLET | Freq: Every day | ORAL | 2 refills | Status: DC

## 2023-10-29 MED ORDER — SERTRALINE HCL 25 MG PO TABS: 25.0000 mg | ORAL_TABLET | Freq: Every day | ORAL | 2 refills | Status: DC

## 2023-10-29 MED ORDER — ALPRAZOLAM 1 MG PO TABS: 1.0000 mg | ORAL_TABLET | Freq: Three times a day (TID) | ORAL | 2 refills | Status: DC

## 2023-10-29 NOTE — Progress Notes (Signed)
 Virtual Visit via Video Note  I connected with Kayla Sanchez on 10/29/23 at  1:40 PM EDT by a video enabled telemedicine application and verified that I am speaking with the correct person using two identifiers.  Location: Patient: home Provider: office   I discussed the limitations of evaluation and management by telemedicine and the availability of in person appointments. The patient expressed understanding and agreed to proceed.     I discussed the assessment and treatment plan with the patient. The patient was provided an opportunity to ask questions and all were answered. The patient agreed with the plan and demonstrated an understanding of the instructions.   The patient was advised to call back or seek an in-person evaluation if the symptoms worsen or if the condition fails to improve as anticipated.  I provided 20 minutes of non-face-to-face time during this encounter.   [name], MD  [facility] MD/PA/NP OP Progress Note  10/29/2023 1:44 PM TALINA PLEITEZ  MRN:  [id number]  Chief Complaint:  Chief Complaint  Patient presents with   Anxiety   Depression   Follow-up   HPI: This patient is a 28 year old single white female who lives with her boyfriend and her 2 children in [place].  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  The patient returns for follow-up after about 6 weeks regarding her depression and anxiety.  For the most part she is doing better.  She is working at [employer] where her mother also works and for the most part she enjoys it.  She states her anxiety has been generally under good control although last week she had a severe panic attack on the way to work.  She never increased the Zoloft from 50-75 because she did not "want to break the pill."  I offered to send in 2 different dosages and she states that she will try this.  The Xanax continues to help her anxiety.  She denies being depressed.  She is sleeping well she looks brighter in affect and it  seems to be good for her to be out around the public.  She denies any thoughts of self-harm or suicide Visit Diagnosis:    ICD-10-CM   1. Panic disorder  F41.0     2. GAD (generalized anxiety disorder)  F41.1     3. Major depressive disorder with single episode, in partial remission (HCC)  F32.4       Past Psychiatric History: None  Past Medical History:  Past Medical History:  Diagnosis Date   Anxiety    Depression    GERD (gastroesophageal reflux disease)     Past Surgical History:  Procedure Laterality Date   WISDOM TOOTH EXTRACTION      Family Psychiatric History: See below  Family History:  Family History  Problem Relation Age of Onset   Depression Mother    Anxiety disorder Mother    Drug abuse Father    Anxiety disorder Maternal Aunt    Alcohol abuse Maternal Grandfather    Anxiety disorder Maternal Grandfather     Social History:  Social History   Socioeconomic History   Marital status: Single    Spouse name: Not on file   Number of children: Not on file   Years of education: Not on file   Highest education level: Not on file  Occupational History   Not on file  Tobacco Use   Smoking status: Every Day    Current packs/day: 1.00  Average packs/day: 1 pack/day for 4.0 years (4.0 ttl pk-yrs)    Types: Cigarettes   Smokeless tobacco: Never  Vaping Use   Vaping status: Former  Substance and Sexual Activity   Alcohol use: Yes    Comment: 3 beers 1-2 x per week   Drug use: No    Comment: 05-21-2016 per pt 2 yrs ago   Sexual activity: Not Currently    Birth control/protection: None  Other Topics Concern   Not on file  Social History Narrative   Not on file   Social Drivers of Health   Financial Resource Strain: Low Risk  (02/05/2023)   [facility]   Overall Financial Resource Strain (CARDIA)    Difficulty of Paying Living Expenses: Not very hard  Food Insecurity: No Food Insecurity (08/08/2023)   Received from [facility]    Hunger Vital Sign    Worried About Running Out of Food in the Last Year: Never true    Ran Out of Food in the Last Year: Never true  Transportation Needs: No Transportation Needs (08/08/2023)   Received from [facility]   PRAPARE - Transportation    Lack of Transportation (Medical): No    Lack of Transportation (Non-Medical): No  Physical Activity: Inactive (08/08/2023)   Received from [facility]   Exercise Vital Sign    Days of Exercise per Week: 0 days    Minutes of Exercise per Session: 0 min  Stress: Stress Concern Present (08/08/2023)   Received from [facility] - Occupational Stress Questionnaire    Feeling of Stress : Rather much  Social Connections: Socially Isolated (08/08/2023)   Received from [facility]   Social Connection and Isolation Panel [NHANES]    Frequency of Communication with Friends and Family: More than three times a week    Frequency of Social Gatherings with Friends and Family: More than three times a week    Attends Religious Services: Never    Database administrator or Organizations: No    Attends Banker Meetings: Never    Marital Status: Never married    Allergies:  Allergies  Allergen Reactions   Ciprofloxacin Other (See Comments)    Tendonitis    Lisinopril Other (See Comments)    Hand numbness   Penicillins Hives    Has patient had a PCN reaction causing immediate rash, facial/tongue/throat swelling, SOB or lightheadedness with hypotension: No Has patient had a PCN reaction causing severe rash involving mucus membranes or skin necrosis: No Has patient had a PCN reaction that required hospitalization No Has patient had a PCN reaction occurring within the last 10 years: No If all of the above answers are "NO", then may proceed with Cephalosporin use.    Nitrofurantoin Itching    Metabolic Disorder Labs: No results found for: "HGBA1C", "MPG" No results found for:  "PROLACTIN" No results found for: "CHOL", "TRIG", "HDL", "CHOLHDL", "VLDL", "LDLCALC" Lab Results  Component Value Date   TSH 0.635 07/31/2016    Therapeutic Level Labs: No results found for: "LITHIUM" No results found for: "VALPROATE" No results found for: "CBMZ"  Current Medications: Current Outpatient Medications  Medication Sig Dispense Refill   sertraline (ZOLOFT) 25 MG tablet Take 1 tablet (25 mg total) by mouth daily. Add to 50 mg for a total of 75 mg 30 tablet 2   ALPRAZolam (XANAX) 1 MG tablet Take 1 tablet (1 mg total) by  mouth 3 (three) times daily. 90 tablet 2   amLODipine (NORVASC) 10 MG tablet Take 1 tablet by mouth daily.     benzonatate (TESSALON) 100 MG capsule Take 1-2 capsules (100-200 mg total) by mouth 3 (three) times daily as needed. 30 capsule 0   cetirizine (ZYRTEC ALLERGY) 10 MG tablet Take 1 tablet (10 mg total) by mouth daily for 14 days. 14 tablet 0   fluticasone (FLONASE) 50 MCG/ACT nasal spray Place 2 sprays into both nostrils daily. 16 g 6   omeprazole (PRILOSEC) 20 MG capsule Take 1 capsule (20 mg total) by mouth daily. 30 capsule 3   predniSONE (DELTASONE) 20 MG tablet Take 1 tablet (20 mg total) by mouth daily with breakfast. 5 tablet 0   sertraline (ZOLOFT) 50 MG tablet Take 1 tablet (50 mg total) by mouth daily. 30 tablet 2   No current facility-administered medications for this visit.     Musculoskeletal: Strength & Muscle Tone: within normal limits Gait & Station: normal Patient leans: N/A  Psychiatric Specialty Exam: Review of Systems  Psychiatric/Behavioral:  The patient is nervous/anxious.   All other systems reviewed and are negative.   There were no vitals taken for this visit.There is no height or weight on file to calculate BMI.  General Appearance: Casual and Fairly Groomed  Eye Contact:  Good  Speech:  Clear and Coherent  Volume:  Normal  Mood:  Anxious and Euthymic  Affect:  Congruent  Thought Process:  Goal Directed   Orientation:  Full (Time, Place, and Person)  Thought Content: WDL   Suicidal Thoughts:  No  Homicidal Thoughts:  No  Memory:  Immediate;   Good Recent;   Good Remote;   Fair  Judgement:  Good  Insight:  Fair  Psychomotor Activity:  Normal  Concentration:  Concentration: Good and Attention Span: Good  Recall:  Good  Fund of Knowledge: Good  Language: Good  Akathisia:  No  Handed:  Right  AIMS (if indicated): not done  Assets:  Communication Skills Desire for Improvement Physical Health Resilience Social Support Talents/Skills  ADL's:  Intact  Cognition: WNL  Sleep:  Good   Screenings: GAD-7    Flowsheet Row Counselor from 10/09/2021 in [facility] [place] Office Visit from 04/19/2016 in [facility] Office Visit from 02/28/2016 in [facility]  Total GAD-7 Score 18 20 19       PHQ2-9    Flowsheet Row Video Visit from 09/26/2022 in [facility] at [place] Video Visit from 06/01/2022 in [facility] Outpatient Behavioral Health at [place] Video Visit from 05/04/2022 in [facility]patient [facility] at [place] Video Visit from 02/23/2022 in [facility] Outpatient Behavior[facility] at [place] Video Visit from 01/04/2022 in [facility] at [facility] Total Score 5 1 2  0 2  PHQ-9 Total Score 11 -- 5 -- 5      Flowsheet Row Video Visit from 09/26/2022 in [facility]patient Behavior[facility] at [place] Video Visit from 06/01/2022 in [facility]patient [facility] at [place] Video Visit from 05/04/2022 in [facility]patient [facility] at [place]  C-SSRS RISK CATEGORY No Risk No Risk No Risk        Assessment and Plan: This patient is a 28 year old female with a history of anxiety and depression.  I still maintain that she would feel better on a slightly higher dose of  Zoloft and  have less breakthrough panic.  We will try again to increase it from 50 to 75 mg daily.  She will also continue Xanax 1 mg 2-3 times daily as needed for anxiety.  She will return to see me in 3 months  Collaboration of Care: Collaboration of Care: Primary Care Provider AEB notes will be shared with PCP at patient's request  Patient/Guardian was advised Release of Information must be obtained prior to any record release in order to collaborate their care with an outside provider. Patient/Guardian was advised if they have not already done so to contact the registration department to sign all necessary forms in order for [place] to release information regarding their care.   Consent: Patient/Guardian gives verbal consent for treatment and assignment of benefits for services provided during this visit. Patient/Guardian expressed understanding and agreed to proceed.    [name], MD 10/29/2023, 1:44 PM

## 2024-01-27 ENCOUNTER — Telehealth (INDEPENDENT_AMBULATORY_CARE_PROVIDER_SITE_OTHER): Admitting: Psychiatry

## 2024-01-27 ENCOUNTER — Encounter (HOSPITAL_COMMUNITY): Payer: Self-pay | Admitting: Psychiatry

## 2024-01-27 DIAGNOSIS — F324 Major depressive disorder, single episode, in partial remission: Secondary | ICD-10-CM

## 2024-01-27 DIAGNOSIS — F411 Generalized anxiety disorder: Secondary | ICD-10-CM | POA: Diagnosis not present

## 2024-01-27 DIAGNOSIS — F410 Panic disorder [episodic paroxysmal anxiety] without agoraphobia: Secondary | ICD-10-CM | POA: Diagnosis not present

## 2024-01-27 MED ORDER — SERTRALINE HCL 50 MG PO TABS: 50.0000 mg | ORAL_TABLET | Freq: Every day | ORAL | 2 refills | Status: DC

## 2024-01-27 MED ORDER — ALPRAZOLAM 1 MG PO TABS: 1.0000 mg | ORAL_TABLET | Freq: Three times a day (TID) | ORAL | 2 refills | Status: DC

## 2024-01-27 MED ORDER — SERTRALINE HCL 25 MG PO TABS: 25.0000 mg | ORAL_TABLET | Freq: Every day | ORAL | 2 refills | Status: DC

## 2024-01-27 NOTE — Progress Notes (Signed)
 Virtual Visit via Video Note  I connected with Kayla Sanchez on 01/27/24 at  3:20 PM EDT by a video enabled telemedicine application and verified that I am speaking with the correct person using two identifiers.  Location: Patient: home Provider: office   I discussed the limitations of evaluation and management by telemedicine and the availability of in person appointments. The patient expressed understanding and agreed to proceed.     I discussed the assessment and treatment plan with the patient. The patient was provided an opportunity to ask questions and all were answered. The patient agreed with the plan and demonstrated an understanding of the instructions.   The patient was advised to call back or seek an in-person evaluation if the symptoms worsen or if the condition fails to improve as anticipated.  I provided 20 minutes of non-face-to-face time during this encounter.   Barnie Gull, MD  [facility] MD/PA/NP OP Progress Note  01/27/2024 3:30 PM Kayla Sanchez  MRN:  [id number]  Chief Complaint:  Chief Complaint  Patient presents with   Anxiety   Depression   Follow-up   HPI: This patient is a 28 year old single white female who lives with her boyfriend and her 2 children in [place]. 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   The patient returns for follow-up after 3 months regarding her depression and anxiety.  For the most part she is doing well.  She enjoys working at [employer] with her mother.  She still has some anxiety and very occasional panic attacks.  She never increased the Zoloft  from 50 to 75 mg and I still think this would be a good idea because it would enable her to probably use less Xanax  per day.  She spoke to her OB/GYN and apparently this physician agrees.  She claims that she is going to try this now.  She states that she is sleeping well and denies significant depression thoughts of self-harm or suicide. Visit Diagnosis:    ICD-10-CM   1. Panic  disorder  F41.0     2. GAD (generalized anxiety disorder)  F41.1     3. Major depressive disorder with single episode, in partial remission (HCC)  F32.4       Past Psychiatric History: none  Past Medical History:  Past Medical History:  Diagnosis Date   Anxiety    Depression    GERD (gastroesophageal reflux disease)     Past Surgical History:  Procedure Laterality Date   WISDOM TOOTH EXTRACTION      Family Psychiatric History: See below  Family History:  Family History  Problem Relation Age of Onset   Depression Mother    Anxiety disorder Mother    Drug abuse Father    Anxiety disorder Maternal Aunt    Alcohol abuse Maternal Grandfather    Anxiety disorder Maternal Grandfather     Social History:  Social History   Socioeconomic History   Marital status: Single    Spouse name: Not on file   Number of children: Not on file   Years of education: Not on file   Highest education level: Not on file  Occupational History   Not on file  Tobacco Use   Smoking status: Every Day    Current packs/day: 1.00    Average packs/day: 1 pack/day for 4.0 years (4.0 ttl pk-yrs)    Types: Cigarettes   Smokeless tobacco: Never  Vaping Use   Vaping status: Former  Substance and Sexual Activity  Alcohol use: Yes    Comment: 3 beers 1-2 x per week   Drug use: No    Comment: 05-21-2016 per pt 2 yrs ago   Sexual activity: Not Currently    Birth control/protection: None  Other Topics Concern   Not on file  Social History Narrative   Not on file   Social Drivers of Health   Financial Resource Strain: Low Risk  (02/05/2023)   Received from [facility]   Overall Financial Resource Strain (CARDIA)    Difficulty of Paying Living Expenses: Not very hard  Food Insecurity: No Food Insecurity (08/08/2023)   Received from [facility]   Hunger Vital Sign    Within the past 12 months, you worried that your food would run out before you got the money to buy more.: Never true     Within the past 12 months, the food you bought just didn't last and you didn't have money to get more.: Never true  Transportation Needs: No Transportation Needs (08/08/2023)   Received from [facility] - Transportation    Lack of Transportation (Medical): No    Lack of Transportation (Non-Medical): No  Physical Activity: Inactive (08/08/2023)   Received from [facility]   Exercise Vital Sign    On average, how many days per week do you engage in moderate to strenuous exercise (like a brisk walk)?: 0 days    On average, how many minutes do you engage in exercise at this level?: 0 min  Stress: Stress Concern Present (08/08/2023)   Received from [facility] of Occupational Health - Occupational Stress Questionnaire    Feeling of Stress : Rather much  Social Connections: Socially Isolated (08/08/2023)   Received from [facility]   Social Connection and Isolation Panel    In a typical week, how many times do you talk on the phone with family, friends, or neighbors?: More than three times a week    How often do you get together with friends or relatives?: More than three times a week    How often do you attend church or religious services?: Never    Do you belong to any clubs or organizations such as church groups, unions, fraternal or athletic groups, or school groups?: No    How often do you attend meetings of the clubs or organizations you belong to?: Never    Are you married, widowed, divorced, separated, never married, or living with a partner?: Never married    Allergies:  Allergies  Allergen Reactions   Ciprofloxacin Other (See Comments)    Tendonitis    Lisinopril Other (See Comments)    Hand numbness   Penicillins Hives    Has patient had a PCN reaction causing immediate rash, facial/tongue/throat swelling, SOB or lightheadedness with hypotension: No Has patient had a PCN reaction causing severe rash involving mucus membranes or skin  necrosis: No Has patient had a PCN reaction that required hospitalization No Has patient had a PCN reaction occurring within the last 10 years: No If all of the above answers are NO, then may proceed with Cephalosporin use.    Nitrofurantoin Itching    Metabolic Disorder Labs: No results found for: HGBA1C, MPG No results found for: PROLACTIN No results found for: CHOL, TRIG, HDL, CHOLHDL, VLDL, LDLCALC Lab Results  Component Value Date   TSH 0.635 07/31/2016    Therapeutic Level Labs: No results found for: LITHIUM No results  found for: VALPROATE No results found for: CBMZ  Current Medications: Current Outpatient Medications  Medication Sig Dispense Refill   ALPRAZolam  (XANAX ) 1 MG tablet Take 1 tablet (1 mg total) by mouth 3 (three) times daily. 90 tablet 2   amLODipine (NORVASC) 10 MG tablet Take 1 tablet by mouth daily.     benzonatate  (TESSALON ) 100 MG capsule Take 1-2 capsules (100-200 mg total) by mouth 3 (three) times daily as needed. 30 capsule 0   cetirizine  (ZYRTEC  ALLERGY) 10 MG tablet Take 1 tablet (10 mg total) by mouth daily for 14 days. 14 tablet 0   fluticasone  (FLONASE ) 50 MCG/ACT nasal spray Place 2 sprays into both nostrils daily. 16 g 6   omeprazole  (PRILOSEC) 20 MG capsule Take 1 capsule (20 mg total) by mouth daily. 30 capsule 3   predniSONE  (DELTASONE ) 20 MG tablet Take 1 tablet (20 mg total) by mouth daily with breakfast. 5 tablet 0   sertraline  (ZOLOFT ) 25 MG tablet Take 1 tablet (25 mg total) by mouth daily. Add to 50 mg for a total of 75 mg 30 tablet 2   sertraline  (ZOLOFT ) 50 MG tablet Take 1 tablet (50 mg total) by mouth daily. 30 tablet 2   No current facility-administered medications for this visit.     Musculoskeletal: Strength & Muscle Tone: within normal limits Gait & Station: normal Patient leans: N/A  Psychiatric Specialty Exam: Review of Systems  Psychiatric/Behavioral:  The patient is nervous/anxious.   All  other systems reviewed and are negative.   There were no vitals taken for this visit.There is no height or weight on file to calculate BMI.  General Appearance: Casual and Fairly Groomed  Eye Contact:  Good  Speech:  Clear and Coherent  Volume:  Normal  Mood:  Euthymic  Affect:  Congruent  Thought Process:  Goal Directed  Orientation:  Full (Time, Place, and Person)  Thought Content: WDL   Suicidal Thoughts:  No  Homicidal Thoughts:  No  Memory:  Immediate;   Good Recent;   Good Remote;   Fair  Judgement:  Good  Insight:  Fair  Psychomotor Activity:  Normal  Concentration:  Concentration: Good and Attention Span: Good  Recall:  Good  Fund of Knowledge: Good  Language: Good  Akathisia:  No  Handed:  Right  AIMS (if indicated): not done  Assets:  Communication Skills Desire for Improvement Physical Health Resilience Social Support Talents/Skills  ADL's:  Intact  Cognition: WNL  Sleep:  Good   Screenings: GAD-7    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 in [facility] [place] Office Visit from 04/19/2016 in [facility] Office Visit from 02/28/2016 in [facility]  Total GAD-7 Score 18 20 19    PHQ2-9    Flowsheet Row Video Visit from 09/26/2022 in [facility] at [place] Video Visit from 06/01/2022 in [facility]patient [facility] at [place] Video Visit from 05/04/2022 in [facility] Outpatient Behavioral Health at [place] Video Visit from 02/23/2022 in [facility]patient [facility]al Health at [place] Video Visit from 01/04/2022 in [facility] at [facility] Total Score 5 1 2  0 2  PHQ-9 Total Score 11 -- 5 -- 5   Flowsheet Row Video Visit from 09/26/2022 in [facility] Outpatient [facility] at [place] Video Visit from 06/01/2022 in [facility] Outpatient Behavioral Health at  [place] Video Visit from 05/04/2022 in [facility] Outpatient  [facility] Health at [facility] RISK CATEGORY No Risk No Risk No Risk     Assessment and Plan: This patient is a 28 year old female with a history of anxiety and depression.  She is going to try to increase her Zoloft  to 75 mg daily for anxiety.  She will also continue Xanax  1 mg 2-3 times daily as needed for anxiety.  She will return to see me in 3 months  Collaboration of Care: Collaboration of Care: Primary Care Provider AEB notes will be shared with PCP at patient's request  Patient/Guardian was advised Release of Information must be obtained prior to any record release in order to collaborate their care with an outside provider. Patient/Guardian was advised if they have not already done so to contact the registration department to sign all necessary forms in order for us  to release information regarding their care.   Consent: Patient/Guardian gives verbal consent for treatment and assignment of benefits for services provided during this visit. Patient/Guardian expressed understanding and agreed to proceed.    Barnie Gull, MD 01/27/2024, 3:30 PM

## 2024-03-31 ENCOUNTER — Institutional Professional Consult (permissible substitution): Admitting: Plastic Surgery

## 2024-04-25 ENCOUNTER — Other Ambulatory Visit (HOSPITAL_COMMUNITY): Payer: Self-pay | Admitting: Psychiatry

## 2024-04-28 ENCOUNTER — Telehealth (INDEPENDENT_AMBULATORY_CARE_PROVIDER_SITE_OTHER): Admitting: Psychiatry

## 2024-04-28 ENCOUNTER — Encounter (HOSPITAL_COMMUNITY): Payer: Self-pay | Admitting: Psychiatry

## 2024-04-28 DIAGNOSIS — F324 Major depressive disorder, single episode, in partial remission: Secondary | ICD-10-CM

## 2024-04-28 DIAGNOSIS — F410 Panic disorder [episodic paroxysmal anxiety] without agoraphobia: Secondary | ICD-10-CM | POA: Diagnosis not present

## 2024-04-28 DIAGNOSIS — F411 Generalized anxiety disorder: Secondary | ICD-10-CM | POA: Diagnosis not present

## 2024-04-28 MED ORDER — SERTRALINE HCL 50 MG PO TABS: 50.0000 mg | ORAL_TABLET | Freq: Every day | ORAL | 0 refills | Status: DC

## 2024-04-28 MED ORDER — ALPRAZOLAM 1 MG PO TABS: 1.0000 mg | ORAL_TABLET | Freq: Three times a day (TID) | ORAL | 2 refills | Status: DC

## 2024-04-28 NOTE — Progress Notes (Signed)
 Virtual Visit via Video Note  I connected with Kayla Sanchez on 04/28/24 at  3:20 PM EDT by a video enabled telemedicine application and verified that I am speaking with the correct person using two identifiers.  Location: Patient: home Provider: office   I discussed the limitations of evaluation and management by telemedicine and the availability of in person appointments. The patient expressed understanding and agreed to proceed.     I discussed the assessment and treatment plan with the patient. The patient was provided an opportunity to ask questions and all were answered. The patient agreed with the plan and demonstrated an understanding of the instructions.   The patient was advised to call back or seek an in-person evaluation if the symptoms worsen or if the condition fails to improve as anticipated.  I provided 20 minutes of non-face-to-face time during this encounter.   Kayla Gull, MD  [facility] MD/PA/NP OP Progress Note  04/28/2024 3:39 PM CHESTINA Sanchez  MRN:  [id number]  Chief Complaint:  Chief Complaint  Patient presents with   Anxiety   Depression   Follow-up   HPI: This patient is a 42 year old single white female who lives with her boyfriend and her 2 children in [place]. 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   The patient returns for follow-up after 3 months regarding her depression and anxiety.  She states for the most part she is doing okay.  She still gets anxious at times.  The Xanax  helps her anxiety.  I urged her last time to increase the Zoloft  but she is still too scared.  She does have the extra 25 mg with her but she has not felt comfortable increasing it to 75 mg.  She claims that she will try.  She is sleeping well and denies significant depression or thoughts of self-harm or suicide.  She is tired from working a lot of long hours. Visit Diagnosis:    ICD-10-CM   1. Panic disorder  F41.0     2. GAD (generalized anxiety disorder)  F41.1      3. 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       Past Psychiatric History: none  Past Medical History:  Past Medical History:  Diagnosis Date   Anxiety    Depression    GERD (gastroesophageal reflux disease)     Past Surgical History:  Procedure Laterality Date   WISDOM TOOTH EXTRACTION      Family Psychiatric History: See below  Family History:  Family History  Problem Relation Age of Onset   Depression Mother    Anxiety disorder Mother    Drug abuse Father    Anxiety disorder Maternal Aunt    Alcohol abuse Maternal Grandfather    Anxiety disorder Maternal Grandfather     Social History:  Social History   Socioeconomic History   Marital status: Single    Spouse name: Not on file   Number of children: Not on file   Years of education: Not on file   Highest education level: Not on file  Occupational History   Not on file  Tobacco Use   Smoking status: Every Day    Current packs/day: 1.00    Average packs/day: 1 pack/day for 4.0 years (4.0 ttl pk-yrs)    Types: Cigarettes   Smokeless tobacco: Never  Vaping Use   Vaping status: Former  Substance and Sexual Activity   Alcohol use: Yes    Comment:  3 beers 1-2 x per week   Drug use: No    Comment: 05-21-2016 per pt 2 yrs ago   Sexual activity: Not Currently    Birth control/protection: None  Other Topics Concern   Not on file  Social History Narrative   Not on file   Social Drivers of Health   Financial Resource Strain: Low Risk  (02/27/2024)   Received from [facility]   Overall Financial Resource Strain (CARDIA)    How hard is it for you to pay for the very basics like food, housing, medical care, and heating?: Not hard at all  Food Insecurity: No Food Insecurity (02/27/2024)   Received from [facility]   Hunger Vital Sign    Within the past 12 months, you worried that your food would run out before you got the money to buy more.: Never true    Within  the past 12 months, the food you bought just didn't last and you didn't have money to get more.: Never true  Transportation Needs: No Transportation Needs (02/27/2024)   Received from [facility] - Transportation    Lack of Transportation (Medical): No    Lack of Transportation (Non-Medical): No  Physical Activity: Inactive (08/08/2023)   Received from [facility]   Exercise Vital Sign    On average, how many days per week do you engage in moderate to strenuous exercise (like a brisk walk)?: 0 days    On average, how many minutes do you engage in exercise at this level?: 0 min  Stress: Stress Concern Present (08/08/2023)   [facility] from [facility] - Occupational Stress Questionnaire    Feeling of Stress : Rather much  Social Connections: Socially Isolated (08/08/2023)   Received from [facility]   Social Connection and Isolation Panel    In a typical week, how many times do you talk on the phone with family, friends, or neighbors?: More than three times a week    How often do you get together with friends or relatives?: More than three times a week    How often do you attend church or religious services?: Never    Do you belong to any clubs or organizations such as church groups, unions, fraternal or athletic groups, or school groups?: No    How often do you attend meetings of the clubs or organizations you belong to?: Never    Are you married, widowed, divorced, separated, never married, or living with a partner?: Never married    Allergies:  Allergies  Allergen Reactions   Ciprofloxacin Other (See Comments)    Tendonitis    Lisinopril Other (See Comments)    Hand numbness   Penicillins Hives    Has patient had a PCN reaction causing immediate rash, facial/tongue/throat swelling, SOB or lightheadedness with hypotension: No Has patient had a PCN reaction causing severe rash involving mucus membranes or skin necrosis:  No Has patient had a PCN reaction that required hospitalization No Has patient had a PCN reaction occurring within the last 10 years: No If all of the above answers are NO, then may proceed with Cephalosporin use.    Nitrofurantoin Itching    Metabolic Disorder Labs: No results found for: HGBA1C, MPG No results found for: PROLACTIN No results found for: CHOL, TRIG, HDL, CHOLHDL, VLDL, LDLCALC Lab Results  Component Value Date   TSH 0.635 07/31/2016    Therapeutic Level  Labs: No results found for: LITHIUM No results found for: VALPROATE No results found for: CBMZ  Current Medications: Current Outpatient Medications  Medication Sig Dispense Refill   ALPRAZolam  (XANAX ) 1 MG tablet Take 1 tablet (1 mg total) by mouth 3 (three) times daily. 90 tablet 2   amLODipine (NORVASC) 10 MG tablet Take 1 tablet by mouth daily.     benzonatate  (TESSALON ) 100 MG capsule Take 1-2 capsules (100-200 mg total) by mouth 3 (three) times daily as needed. 30 capsule 0   cetirizine  (ZYRTEC  ALLERGY) 10 MG tablet Take 1 tablet (10 mg total) by mouth daily for 14 days. 14 tablet 0   fluticasone  (FLONASE ) 50 MCG/ACT nasal spray Place 2 sprays into both nostrils daily. 16 g 6   omeprazole  (PRILOSEC) 20 MG capsule Take 1 capsule (20 mg total) by mouth daily. 30 capsule 3   predniSONE  (DELTASONE ) 20 MG tablet Take 1 tablet (20 mg total) by mouth daily with breakfast. 5 tablet 0   sertraline  (ZOLOFT ) 25 MG tablet Take 1 tablet (25 mg total) by mouth daily. Add to 50 mg for a total of 75 mg 30 tablet 2   sertraline  (ZOLOFT ) 50 MG tablet Take 1 tablet (50 mg total) by mouth daily. 30 tablet 0   No current facility-administered medications for this visit.     Musculoskeletal: Strength & Muscle Tone: within normal limits Gait & Station: normal Patient leans: N/A  Psychiatric Specialty Exam: Review of Systems  Psychiatric/Behavioral:  The patient is nervous/anxious.   All other  systems reviewed and are negative.   There were no vitals taken for this visit.There is no height or weight on file to calculate BMI.  General Appearance: Casual and Fairly Groomed  Eye Contact:  Good  Speech:  Clear and Coherent  Volume:  Normal  Mood:  Anxious and Euthymic  Affect:  Congruent  Thought Process:  Goal Directed  Orientation:  Full (Time, Place, and Person)  Thought Content: WDL   Suicidal Thoughts:  No  Homicidal Thoughts:  No  Memory:  Immediate;   Good Recent;   Good Remote;   Fair  Judgement:  Good  Insight:  Good  Psychomotor Activity:  Normal  Concentration:  Concentration: Good and Attention Span: Good  Recall:  Good  Fund of Knowledge: Good  Language: Good  Akathisia:  No  Handed:  Right  AIMS (if indicated): not done  Assets:  Communication Skills Desire for Improvement Physical Health Resilience Social Support  ADL's:  Intact  Cognition: WNL  Sleep:  Good   Screenings: GAD-7    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 in [facility] [place] Office Visit from 04/19/2016 in [facility] Office Visit from 02/28/2016 in [facility]  Total GAD-7 Score 18 20 19    PHQ2-9    Flowsheet Row Video Visit from 09/26/2022 in [facility] [place] Video Visit from 06/01/2022 in [facility] at [place] Video Visit from 05/04/2022 in [facility] at [place] Video Visit from 02/23/2022 in [facility] at [place] Video Visit from 01/04/2022 in [facility] at [facility] Total Score 5 1 2  0 2  PHQ-9 Total Score 11 -- 5 -- 5   Flowsheet Row Video Visit from 09/26/2022 in [facility] at [place] Video Visit from 06/01/2022 in [facility] at [place]  Video  Visit from 05/04/2022 in [facility] at [place]  C-SSRS RISK CATEGORY No Risk No Risk No Risk     Assessment and Plan: This patient is a 28 year old female with a history of anxiety and depression.  We again discussed increasing Zoloft  from 50 to 75 mg daily for anxiety and she claims that she will try.  She will continue Xanax  1 mg 2-3 times daily as needed for anxiety.  She will return to see me in 3 months  Collaboration of Care: Collaboration of Care: Primary Care Provider AEB notes to be shared with PCP at patient's request  Patient/Guardian was advised Release of Information must be obtained prior to any record release in order to collaborate their care with an outside provider. Patient/Guardian was advised if they have not already done so to contact the registration department to sign all necessary forms in order for us  to release information regarding their care.   Consent: Patient/Guardian gives verbal consent for treatment and assignment of benefits for services provided during this visit. Patient/Guardian expressed understanding and agreed to proceed.    Kayla Gull, MD 04/28/2024, 3:39 PM

## 2024-06-03 ENCOUNTER — Other Ambulatory Visit (HOSPITAL_COMMUNITY): Payer: Self-pay | Admitting: Psychiatry

## 2024-06-09 ENCOUNTER — Encounter: Payer: Self-pay | Admitting: Plastic Surgery

## 2024-06-09 ENCOUNTER — Telehealth: Payer: Self-pay

## 2024-06-09 ENCOUNTER — Ambulatory Visit: Admitting: Plastic Surgery

## 2024-06-09 VITALS — BP 132/84 | HR 78 | Ht 62.0 in | Wt 217.0 lb

## 2024-06-09 DIAGNOSIS — M549 Dorsalgia, unspecified: Secondary | ICD-10-CM | POA: Diagnosis not present

## 2024-06-09 DIAGNOSIS — N62 Hypertrophy of breast: Secondary | ICD-10-CM | POA: Diagnosis not present

## 2024-06-09 DIAGNOSIS — Z6839 Body mass index (BMI) 39.0-39.9, adult: Secondary | ICD-10-CM

## 2024-06-09 DIAGNOSIS — Z720 Tobacco use: Secondary | ICD-10-CM

## 2024-06-09 DIAGNOSIS — G8929 Other chronic pain: Secondary | ICD-10-CM

## 2024-06-09 DIAGNOSIS — M546 Pain in thoracic spine: Secondary | ICD-10-CM | POA: Diagnosis not present

## 2024-06-09 NOTE — Telephone Encounter (Signed)
 Faxed signed order to [facility] to [facility].  Received fax confirmation: This message was sent via FAXCOM, a product from [employer]. [email or web address]                    -------Fax Transmission Report-------  To:               Recipient at [phone] Subject:          SECURED Result:           The transmission was successful. Explanation:      All Pages Ok Pages Sent:       4 Connect Time:     1 minutes, 10 seconds Transmit Time:    06/09/2024 14:17 Transfer Rate:    14400 Status Code:      0000 Retry Count:      0 Job Id:           [id number] Unique Id:        [id number] Fax Line:         23 Fax Server:       [id number]

## 2024-06-09 NOTE — Progress Notes (Signed)
 Patient ID: Kayla Sanchez, female    DOB: 1995/09/19, 28 y.o.   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 female with a history of mammary hyperplasia for several years.  She has extremely large breasts causing symptoms that include the following: Back pain in the upper and lower back, including neck pain. She pulls or pins her bra straps to provide better lift and relief of the pressure and pain. She notices relief by holding her breast up manually.  Her shoulder straps cause grooves and pain and pressure that requires padding for relief. Pain medication is sometimes required with motrin and tylenol.  Activities that are hindered by enlarged breasts include: exercise and running.  She has tried supportive clothing as well as fitted bras without improvement.  Her breasts are extremely large and fairly symmetric but the left is fuller.  She has hyperpigmentation of the inframammary area on both sides.  The sternal to nipple distance on the right is 33 cm and the left is 34 cm.  The IMF distance is 12 cm.  She is 5 feet 2 inches tall and weighs 215 pounds.  The BMI = 39.3 kg/m.  Preoperative bra size = 40H cup.  The estimated excess breast tissue to be removed at the time of surgery = 600-650 grams on the left and 600-650 grams on the right.  Mammogram history: none.  Tobacco use:  currently smoking.   The patient expresses the desire to pursue surgical intervention.  Patient had her tonsils out without any difficulty     Review of Systems  Constitutional:  Positive for activity change. Negative for appetite change.  HENT: Negative.    Eyes: Negative.   Respiratory: Negative.    Cardiovascular: Negative.   Gastrointestinal: Negative.   Genitourinary: Negative.   Musculoskeletal:  Positive for back pain and neck pain.    Past Medical  History:  Diagnosis Date   Anxiety    Depression    GERD (gastroesophageal reflux disease)     Past Surgical History:  Procedure Laterality Date   WISDOM TOOTH EXTRACTION        Current Outpatient Medications:    ALPRAZolam  (XANAX ) 1 MG tablet, Take 1 tablet (1 mg total) by mouth 3 (three) times daily., Disp: 90 tablet, Rfl: 2   amLODipine (NORVASC) 10 MG tablet, Take 1 tablet by mouth daily., Disp: , Rfl:    benzonatate  (TESSALON ) 100 MG capsule, Take 1-2 capsules (100-200 mg total) by mouth 3 (three) times daily as needed., Disp: 30 capsule, Rfl: 0   cetirizine  (ZYRTEC  ALLERGY) 10 MG tablet, Take 1 tablet (10 mg total) by mouth daily for 14 days., Disp: 14 tablet, Rfl: 0   fluticasone  (FLONASE ) 50 MCG/ACT nasal spray, Place 2 sprays into both nostrils daily., Disp: 16 g, Rfl: 6   omeprazole  (PRILOSEC) 20 MG capsule, Take 1 capsule (20 mg total) by mouth daily., Disp: 30 capsule, Rfl: 3   predniSONE  (DELTASONE ) 20 MG tablet, Take 1 tablet (20 mg total) by mouth daily with breakfast., Disp: 5 tablet, Rfl: 0   sertraline  (ZOLOFT ) 25 MG tablet, Take 1 tablet (25 mg total) by mouth daily. Add to 50 mg for a total of 75 mg, Disp: 30 tablet, Rfl: 2   sertraline  (ZOLOFT ) 50 MG tablet, Take 1 tablet by mouth  once daily, Disp: 30 tablet, Rfl: 0   Objective:   Vitals:   06/09/24 1442  BP: 132/84  Pulse: 78  SpO2: 100%    Physical Exam Vitals reviewed.  Constitutional:      Appearance: Normal appearance.  HENT:     Head: Atraumatic.  Cardiovascular:     Rate and Rhythm: Normal rate.     Pulses: Normal pulses.  Pulmonary:     Effort: Pulmonary effort is normal.  Abdominal:     General: There is no distension.     Palpations: Abdomen is soft.     Tenderness: There is no abdominal tenderness.  Skin:    General: Skin is warm.     Capillary Refill: Capillary refill takes less than 2 seconds.     Coloration: Skin is not jaundiced.     Findings: No bruising.  Neurological:      Mental Status: She is alert and oriented to person, place, and time.  Psychiatric:        Mood and Affect: Mood normal.        Behavior: Behavior normal.        Thought Content: Thought content normal.        Judgment: Judgment normal.     Assessment & Plan:  Symptomatic mammary hypertrophy  Chronic bilateral thoracic back pain  The procedure the patient selected and that was best for the patient was discussed. The risk were discussed and include but not limited to the following:  Breast asymmetry, fluid accumulation, firmness of the breast, inability to breast feed, loss of nipple or areola, skin loss, change in skin and nipple sensation, fat necrosis of the breast tissue, bleeding, infection and healing delay.  There are risks of anesthesia and injury to nerves or blood vessels.  Allergic reaction to tape, suture and skin glue are possible.  There will be swelling.  Any of these can lead to the need for revisional surgery which is not included in this surgery.  A breast reduction has potential to interfere with diagnostic procedures in the future.  This procedure is best done when the breast is fully developed.  Changes in the breast will continue to occur over time: pregnancy, weight gain or weigh loss. No guarantees are given for a certain bra or breast size.    Total time: 40 minutes. This includes time spent with the patient during the visit as well as time spent before and after the visit reviewing the chart, documenting the encounter, ordering pertinent studies and literature for the patient.   Physical therapy: Not required Mammogram: Not required The patient is a good candidate for bilateral breast reduction with liposuction but she must be 3 months tobacco free.  My recommendation is to give us  a call when she has stop smoking and then we will plan to get her back in for another evaluation.  I also encouraged her to decrease her carbs in her sugars and increase her protein for healing  and to see if she can lose a little bit of weight to help with her numbers.  Pictures were obtained of the patient and placed in the chart with the patient's or guardian's permission.   Estefana RAMAN Taraya Steward, DO

## 2024-06-27 ENCOUNTER — Other Ambulatory Visit (HOSPITAL_COMMUNITY): Payer: Self-pay | Admitting: Psychiatry

## 2024-07-16 ENCOUNTER — Telehealth (INDEPENDENT_AMBULATORY_CARE_PROVIDER_SITE_OTHER): Admitting: Psychiatry

## 2024-07-16 DIAGNOSIS — F411 Generalized anxiety disorder: Secondary | ICD-10-CM

## 2024-07-16 DIAGNOSIS — F410 Panic disorder [episodic paroxysmal anxiety] without agoraphobia: Secondary | ICD-10-CM

## 2024-07-16 MED ORDER — ALPRAZOLAM 1 MG PO TABS: 1.0000 mg | ORAL_TABLET | Freq: Three times a day (TID) | ORAL | 2 refills | Status: DC

## 2024-07-16 MED ORDER — ESCITALOPRAM OXALATE 20 MG PO TABS: 20.0000 mg | ORAL_TABLET | Freq: Every day | ORAL | 2 refills | Status: DC

## 2024-07-16 NOTE — Progress Notes (Signed)
 Virtual Visit via Video Note  I connected with Kayla Sanchez on 07/16/2024 at  2:20 PM EST by a video enabled telemedicine application and verified that I am speaking with the correct person using two identifiers.  Location: Patient: home Provider: office   I discussed the limitations of evaluation and management by telemedicine and the availability of in person appointments. The patient expressed understanding and agreed to proceed.     I discussed the assessment and treatment plan with the patient. The patient was provided an opportunity to ask questions and all were answered. The patient agreed with the plan and demonstrated an understanding of the instructions.   The patient was advised to call back or seek an in-person evaluation if the symptoms worsen or if the condition fails to improve as anticipated.  I provided 20 minutes of non-face-to-face time during this encounter.   Barnie Gull, MD  [facility] MD/PA/NP OP Progress Note  07/16/2024 2:36 PM Kayla Sanchez  MRN:  [id number]  Chief Complaint:  Chief Complaint  Patient presents with   Anxiety   Depression   Follow-up   HPI: This patient is a 28 year old single white female who lives with her boyfriend and her 2 children in [place]. 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   The returns for follow-up after 3 months regarding her generalized anxiety and panic attacks.  She states that a couple of weeks ago she had a bad case of the flu.  Since it has been hard for her to go back to work full-time.  She is more nervous and panicky now.  She does not think the Zoloft  is working all that well.  She request to go back to Lexapro .  She works with her mother and sometimes it helps because her mother can be supportive but she states also her mother can cause her a lot of anxiety by being bossy.  At some point she is going to try to look for another job.  The Xanax  continues to help to some degree with the acute anxiety.   We discussed how to taper off the Zoloft  while going on Lexapro  and she is willing to try this.  She is also in agreement with trying therapy. Visit Diagnosis:    ICD-10-CM   1. Panic disorder  F41.0     2. GAD (generalized anxiety disorder)  F41.1       Past Psychiatric History: None  Past Medical History:  Past Medical History:  Diagnosis Date   Anxiety    Depression    GERD (gastroesophageal reflux disease)     Past Surgical History:  Procedure Laterality Date   WISDOM TOOTH EXTRACTION      Family Psychiatric History: See below  Family History:  Family History  Problem Relation Age of Onset   Depression Mother    Anxiety disorder Mother    Drug abuse Father    Anxiety disorder Maternal Aunt    Alcohol abuse Maternal Grandfather    Anxiety disorder Maternal Grandfather     Social History:  Social History   Socioeconomic History   Marital status: Single    Spouse name: Not on file   Number of children: Not on file   Years of education: Not on file   Highest education level: Not on file  Occupational History   Not on file  Tobacco Use   Smoking status: Every Day    Current packs/day: 1.00    Average packs/day: 1 pack/day  for 4.0 years (4.0 ttl pk-yrs)    Types: Cigarettes   Smokeless tobacco: Never  Vaping Use   Vaping status: Former  Substance and Sexual Activity   Alcohol use: Yes    Comment: 3 beers 1-2 x per week   Drug use: No    Comment: 05-21-2016 per pt 2 yrs ago   Sexual activity: Not Currently    Birth control/protection: None  Other Topics Concern   Not on file  Social History Narrative   Not on file   Social Drivers of Health   Tobacco Use: High Risk (06/09/2024)   Patient History    Smoking Tobacco Use: Every Day    Smokeless Tobacco Use: Never    Passive Exposure: Not on file  Financial Resource Strain: Low Risk (05/21/2024)   Received from [facility]   Overall Financial Resource Strain (CARDIA)    How hard is it for you  to pay for the very basics like food, housing, medical care, and heating?: Not hard at all  Food Insecurity: No Food Insecurity (05/21/2024)   Received from [facility]   Epic    Within the past 12 months, you worried that your food would run out before you got the money to buy more.: Never true    Within the past 12 months, the food you bought just didn't last and you didn't have money to get more.: Never true  Transportation Needs: No Transportation Needs (05/21/2024)   Received from [facility] - Transportation    Lack of Transportation (Medical): No    Lack of Transportation (Non-Medical): No  Physical Activity: Inactive (08/08/2023)   Received from [facility]   Exercise Vital Sign    On average, how many days per week do you engage in moderate to strenuous exercise (like a brisk walk)?: 0 days    On average, how many minutes do you engage in exercise at this level?: 0 min  Stress: Stress Concern Present (08/08/2023)   Received from [facility] - Occupational Stress Questionnaire    Feeling of Stress : Rather much  Social Connections: Socially Isolated (08/08/2023)   Received from [facility]   Social Connection and Isolation Panel    In a typical week, how many times do you talk on the phone with family, friends, or neighbors?: More than three times a week    How often do you get together with friends or relatives?: More than three times a week    How often do you attend church or religious services?: Never    Do you belong to any clubs or organizations such as church groups, unions, fraternal or athletic groups, or school groups?: No    How often do you attend meetings of the clubs or organizations you belong to?: Never    Are you married, widowed, divorced, separated, never married, or living with a partner?: Never married  Depression (PHQ2-9): High Risk (09/26/2022)   Depression (PHQ2-9)    PHQ-2 Score: 11   Alcohol Screen: Not on file  Housing: Not on file  Utilities: Low Risk (05/21/2024)   Received from [facility]   Utilities    Within the past 12 months, have you been unable to get utilities(heat, electricity) when it was really needed?: No  Health Literacy: Low Risk (08/08/2023)   Received from [facility] Literacy    How often do you  need to have someone help you when you read instructions, pamphlets, or other written material from your doctor or pharmacy?: Never    Allergies: Allergies[1]  Metabolic Disorder Labs: No results found for: HGBA1C, MPG No results found for: PROLACTIN No results found for: CHOL, TRIG, HDL, CHOLHDL, VLDL, LDLCALC Lab Results  Component Value Date   TSH 0.635 07/31/2016    Therapeutic Level Labs: No results found for: LITHIUM No results found for: VALPROATE No results found for: CBMZ  Current Medications: Current Outpatient Medications  Medication Sig Dispense Refill   ALPRAZolam  (XANAX ) 1 MG tablet Take 1 tablet (1 mg total) by mouth 3 (three) times daily. 90 tablet 2   amLODipine (NORVASC) 10 MG tablet Take 1 tablet by mouth daily.     cetirizine  (ZYRTEC  ALLERGY) 10 MG tablet Take 1 tablet (10 mg total) by mouth daily for 14 days. 14 tablet 0   escitalopram  (LEXAPRO ) 20 MG tablet Take 1 tablet (20 mg total) by mouth daily. 30 tablet 2   fluticasone  (FLONASE ) 50 MCG/ACT nasal spray Place 2 sprays into both nostrils daily. 16 g 6   No current facility-administered medications for this visit.     Musculoskeletal: Strength & Muscle Tone: within normal limits Gait & Station: normal Patient leans: N/A  Psychiatric Specialty Exam: Review of Systems  Psychiatric/Behavioral:  The patient is nervous/anxious.   All other systems reviewed and are negative.   There were no vitals taken for this visit.There is no height or weight on file to calculate BMI.  General Appearance: Casual and Fairly Groomed  Eye  Contact:  Good  Speech:  Clear and Coherent  Volume:  Normal  Mood:  Anxious  Affect:  Congruent  Thought Process:  Goal Directed  Orientation:  Full (Time, Place, and Person)  Thought Content: Rumination   Suicidal Thoughts:  No  Homicidal Thoughts:  No  Memory:  Immediate;   Good Recent;   Good Remote;   NA  Judgement:  Good  Insight:  Fair  Psychomotor Activity:  Normal  Concentration:  Concentration: Good and Attention Span: Good  Recall:  Good  Fund of Knowledge: Good  Language: Good  Akathisia:  No  Handed:  Right  AIMS (if indicated): not done  Assets:  Communication Skills Desire for Improvement Physical Health Resilience Social Support  ADL's:  Intact  Cognition: WNL  Sleep:  Good   Screenings: GAD-7    Flowsheet Row Counselor from 10/09/2021 in [facility] [place] Office Visit from 04/19/2016 in [facility] Office Visit from 02/28/2016 in [facility]  Total GAD-7 Score 18 20 19    PHQ2-9    Flowsheet Row Video Visit from 09/26/2022 in [facility] [place] Video Visit from 06/01/2022 in [facility] at [place] Video Visit from 05/04/2022 in [facility] at [place] Video Visit from 02/23/2022 in [facility] at [place] Video Visit from 01/04/2022 in [facility] Total Score 5 1 2  0 2  PHQ-9 Total Score 11 -- 5 -- 5   Flowsheet Row Video Visit from 09/26/2022 in [facility] at [place] Video Visit from 06/01/2022 in [facility] at [place] Video Visit from 05/04/2022 in [facility] at [place]  C-SSRS RISK CATEGORY No Risk No Risk No Risk     Assessment and  Plan: This patient is a 28 year old female with a  history of major depression and generalized anxiety.  Her main problem now continues to be the anxiety.  She request to return to Lexapro  she will start Lexapro  20 mg and continue Zoloft  25 mg for a few days and then stop the Zoloft .  She will continue Xanax  1 mg 2-3 times daily as needed for anxiety.  She will return to see me in 4 weeks  Collaboration of Care: Collaboration of Care: Referral or follow-up with counselor/therapist AEB patient now agrees to see therapist Lauraine Pereyra in our office  Patient/Guardian was advised Release of Information must be obtained prior to any record release in order to collaborate their care with an outside provider. Patient/Guardian was advised if they have not already done so to contact the registration department to sign all necessary forms in order for us  to release information regarding their care.   Consent: Patient/Guardian gives verbal consent for treatment and assignment of benefits for services provided during this visit. Patient/Guardian expressed understanding and agreed to proceed.    Barnie Gull, MD 07/16/2024, 2:36 PM     [1]  Allergies Allergen Reactions   Ciprofloxacin Other (See Comments)    Tendonitis    Lisinopril Other (See Comments)    Hand numbness   Penicillins Hives    Has patient had a PCN reaction causing immediate rash, facial/tongue/throat swelling, SOB or lightheadedness with hypotension: No Has patient had a PCN reaction causing severe rash involving mucus membranes or skin necrosis: No Has patient had a PCN reaction that required hospitalization No Has patient had a PCN reaction occurring within the last 10 years: No If all of the above answers are NO, then may proceed with Cephalosporin use.    Nitrofurantoin Itching

## 2024-08-27 ENCOUNTER — Telehealth (HOSPITAL_COMMUNITY): Admitting: Psychiatry

## 2024-08-27 ENCOUNTER — Encounter (HOSPITAL_COMMUNITY): Payer: Self-pay | Admitting: Psychiatry

## 2024-08-27 DIAGNOSIS — F411 Generalized anxiety disorder: Secondary | ICD-10-CM

## 2024-08-27 DIAGNOSIS — F324 Major depressive disorder, single episode, in partial remission: Secondary | ICD-10-CM

## 2024-08-27 DIAGNOSIS — F410 Panic disorder [episodic paroxysmal anxiety] without agoraphobia: Secondary | ICD-10-CM

## 2024-08-27 MED ORDER — ESCITALOPRAM OXALATE 20 MG PO TABS: 20.0000 mg | ORAL_TABLET | Freq: Every day | ORAL | 2 refills | Status: AC

## 2024-08-27 MED ORDER — BUPROPION HCL 75 MG PO TABS: 75.0000 mg | ORAL_TABLET | ORAL | 2 refills | Status: AC

## 2024-08-27 MED ORDER — ALPRAZOLAM 1 MG PO TABS: 1.0000 mg | ORAL_TABLET | Freq: Three times a day (TID) | ORAL | 2 refills | Status: AC

## 2024-08-27 NOTE — Progress Notes (Signed)
 Virtual Visit via Video Note  I connected with Kayla Sanchez on 08/27/24 at  3:40 PM EST by a video enabled telemedicine application and verified that I am speaking with the correct person using two identifiers.  Location: Patient: home Provider: office   I discussed the limitations of evaluation and management by telemedicine and the availability of in person appointments. The patient expressed understanding and agreed to proceed.      I discussed the assessment and treatment plan with the patient. The patient was provided an opportunity to ask questions and all were answered. The patient agreed with the plan and demonstrated an understanding of the instructions.   The patient was advised to call back or seek an in-person evaluation if the symptoms worsen or if the condition fails to improve as anticipated.  I provided 20 minutes of non-face-to-face time during this encounter.   Barnie Gull, MD  [facility] MD/PA/NP OP Progress Note  08/27/2024 3:56 PM Kayla Sanchez  MRN:  [id number]  Chief Complaint:  Chief Complaint  Patient presents with   Anxiety   Depression   Follow-up   HPI: This patient is a 29 year old single white female who lives with her boyfriend and her 2 children in [place]. 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   The patient returns for follow-up after 3 months regarding her generalized anxiety disorder panic attacks and depression.  Last time we switched her from Zoloft  to Lexapro .  She is now on Lexapro  20 mg daily.  She does state that she is less anxious but now is somewhat depressed.  She is not suicidal and is still taking care of her children and going to work.  She has had a little bit more trouble sleeping but sleeps well if she takes a Xanax  before bed.  We discussed adding a low-dose of Wellbutrin  to help her energy and depression and she is willing to try this. Visit Diagnosis:    ICD-10-CM   1. Panic disorder  F41.0     2. 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       Past Psychiatric History: none Past Medical History:  Past Medical History:  Diagnosis Date   Anxiety    Depression    GERD (gastroesophageal reflux disease)     Past Surgical History:  Procedure Laterality Date   WISDOM TOOTH EXTRACTION      Family Psychiatric History: See below  Family History:  Family History  Problem Relation Age of Onset   Depression Mother    Anxiety disorder Mother    Drug abuse Father    Anxiety disorder Maternal Aunt    Alcohol abuse Maternal Grandfather    Anxiety disorder Maternal Grandfather     Social History:  Social History   Socioeconomic History   Marital status: Single    Spouse name: Not on file   Number of children: Not on file   Years of education: Not on file   Highest education level: Not on file  Occupational History   Not on file  Tobacco Use   Smoking status: Every Day    Current packs/day: 1.00    Average packs/day: 1 pack/day for 4.0 years (4.0 ttl pk-yrs)    Types: Cigarettes   Smokeless tobacco: Never  Vaping Use   Vaping status: Former  Substance and Sexual Activity   Alcohol use: Yes  Comment: 3 beers 1-2 x per week   Drug use: No    Comment: 05-21-2016 per pt 2 yrs ago   Sexual activity: Not Currently    Birth control/protection: None  Other Topics Concern   Not on file  Social History Narrative   Not on file   Social Drivers of Health   Tobacco Use: High Risk (08/27/2024)   Patient History    Smoking Tobacco Use: Every Day    Smokeless Tobacco Use: Never    Passive Exposure: Not on file  Financial Resource Strain: Low Risk (05/21/2024)   [facility]   Overall Financial Resource Strain (CARDIA)    How hard is it for you to pay for the very basics like food, housing, medical care, and heating?: Not hard at all  Food Insecurity: No Food Insecurity  (05/21/2024)   [facility]   Epic    Within the past 12 months, you worried that your food would run out before you got the money to buy more.: Never true    Within the past 12 months, the food you bought just didn't last and you didn't have money to get more.: Never true  Transportation Needs: No Transportation Needs (05/21/2024)   [facility] from [facility] - Transportation    Lack of Transportation (Medical): No    Lack of Transportation (Non-Medical): No  Physical Activity: Inactive (08/08/2023)   [facility]   Exercise Vital Sign    On average, how many days per week do you engage in moderate to strenuous exercise (like a brisk walk)?: 0 days    On average, how many minutes do you engage in exercise at this level?: 0 min  Stress: Stress Concern Present (08/08/2023)   Received from [facility] - Occupational Stress Questionnaire    Feeling of Stress : Rather much  Social Connections: Socially Isolated (08/08/2023)   Received from [facility]   Social Connection and Isolation Panel    In a typical week, how many times do you talk on the phone with family, friends, or neighbors?: More than three times a week    How often do you get together with friends or relatives?: More than three times a week    How often do you attend church or religious services?: Never    Do you belong to any clubs or organizations such as church groups, unions, fraternal or athletic groups, or school groups?: No    How often do you attend meetings of the clubs or organizations you belong to?: Never    Are you married, widowed, divorced, separated, never married, or living with a partner?: Never married  Depression (PHQ2-9): High Risk (09/26/2022)   Depression (PHQ2-9)    PHQ-2 Score: 11  Alcohol Screen: Not on file  Housing: Not on file  Utilities: Low Risk (05/21/2024)   Received [facility]   Utilities     Within the past 12 months, have you been unable to get utilities(heat, electricity) when it was really needed?: No  Health Literacy: Low Risk (08/08/2023)   Received from [facility] Literacy    How often do you need to have someone help you when you read instructions, pamphlets, or other written material from your doctor or pharmacy?: Never    Allergies: Allergies[1]  Metabolic Disorder Labs: No results found for: HGBA1C, MPG No  results found for: PROLACTIN No results found for: CHOL, TRIG, HDL, CHOLHDL, VLDL, LDLCALC Lab Results  Component Value Date   TSH 0.635 07/31/2016    Therapeutic Level Labs: No results found for: LITHIUM No results found for: VALPROATE No results found for: CBMZ  Current Medications: Current Outpatient Medications  Medication Sig Dispense Refill   buPROPion  (WELLBUTRIN ) 75 MG tablet Take 1 tablet (75 mg total) by mouth every morning. 30 tablet 2   ALPRAZolam  (XANAX ) 1 MG tablet Take 1 tablet (1 mg total) by mouth 3 (three) times daily. 90 tablet 2   amLODipine (NORVASC) 10 MG tablet Take 1 tablet by mouth daily.     cetirizine  (ZYRTEC  ALLERGY) 10 MG tablet Take 1 tablet (10 mg total) by mouth daily for 14 days. 14 tablet 0   escitalopram  (LEXAPRO ) 20 MG tablet Take 1 tablet (20 mg total) by mouth daily. 30 tablet 2   fluticasone  (FLONASE ) 50 MCG/ACT nasal spray Place 2 sprays into both nostrils daily. 16 g 6   No current facility-administered medications for this visit.     Musculoskeletal: Strength & Muscle Tone: within normal limits Gait & Station: normal Patient leans: N/A  Psychiatric Specialty Exam: Review of Systems  Psychiatric/Behavioral:  Positive for dysphoric mood.   All other systems reviewed and are negative.   There were no vitals taken for this visit.There is no height or weight on file to calculate BMI.  General Appearance: Casual and Fairly Groomed  Eye Contact:  Good  Speech:  Clear  and Coherent  Volume:  Normal  Mood: Dysphoric  Affect:  Congruent  Thought Process:  Goal Directed  Orientation:  Full (Time, Place, and Person)  Thought Content: WDL   Suicidal Thoughts:  No  Homicidal Thoughts:  No  Memory:  Immediate;   Good Recent;   Good Remote;   NA  Judgement:  Good  Insight:  Good  Psychomotor Activity:  Decreased  Concentration:  Concentration: Good and Attention Span: Good  Recall:  Good  Fund of Knowledge: Good  Language: Good  Akathisia:  No  Handed:  Right  AIMS (if indicated): not done  Assets:  Communication Skills Desire for Improvement Physical Health Resilience Social Support  ADL's:  Intact  Cognition: WNL  Sleep:  Fair   Screenings: GAD-7    Advertising Copywriter from 10/09/2021 in [facility] [place] Office Visit from 04/19/2016 in [facility] Office Visit from 02/28/2016 in [facility]  Total GAD-7 Score 18 20 19    PHQ2-9    Flowsheet Row Video Visit from 09/26/2022 in [facility] at [place] Video Visit from 06/01/2022 in [facility] at [place] Video Visit from 05/04/2022 in [facility] at [place] Video Visit from 02/23/2022 in [facility] at [place] Video Visit from 01/04/2022 in [facility] Total Score 5 1 2  0 2  PHQ-9 Total Score 11 -- 5 -- 5   Flowsheet Row Video Visit from 09/26/2022 in [facility] at [place] Video Visit from 06/01/2022 in [facility]patient [facility] at [place] Video Visit from 05/04/2022 in [facility] at [place]  C-SSRS RISK CATEGORY No Risk No Risk No Risk     Assessment and Plan: This patient is a 29 year old female with a history of major depression and  generalized anxiety disorder.  She is doing better in terms of anxiety with the Lexapro  20 mg but we will add Wellbutrin  75 mg to help with the depression.  She will continue Xanax  1 mg 2-3 times daily as needed for anxiety.  She will return to see me in 4 weeks  Collaboration of Care: Collaboration of Care: Primary Care Provider AEB notes will be shared with PCP at patient's request  Patient/Guardian was advised Release of Information must be obtained prior to any record release in order to collaborate their care with an outside provider. Patient/Guardian was advised if they have not already done so to contact the registration department to sign all necessary forms in order for us  to release information regarding their care.   Consent: Patient/Guardian gives verbal consent for treatment and assignment of benefits for services provided during this visit. Patient/Guardian expressed understanding and agreed to proceed.    Barnie Gull, MD 08/27/2024, 3:56 PM     [1]  Allergies Allergen Reactions   Ciprofloxacin Other (See Comments)    Tendonitis    Lisinopril Other (See Comments)    Hand numbness   Penicillins Hives    Has patient had a PCN reaction causing immediate rash, facial/tongue/throat swelling, SOB or lightheadedness with hypotension: No Has patient had a PCN reaction causing severe rash involving mucus membranes or skin necrosis: No Has patient had a PCN reaction that required hospitalization No Has patient had a PCN reaction occurring within the last 10 years: No If all of the above answers are NO, then may proceed with Cephalosporin use.    Nitrofurantoin Itching
